# Patient Record
Sex: Female | Born: 1935 | Race: White | Hispanic: No | Smoking: Never smoker
Health system: Southern US, Community
[De-identification: ages and names within clinical notes are randomized; demographics above are authoritative.]

## PROBLEM LIST (undated history)

## (undated) DIAGNOSIS — H18519 Endothelial corneal dystrophy, unspecified eye: Secondary | ICD-10-CM

## (undated) DIAGNOSIS — M25519 Pain in unspecified shoulder: Secondary | ICD-10-CM

## (undated) DIAGNOSIS — I4891 Unspecified atrial fibrillation: Secondary | ICD-10-CM

## (undated) DIAGNOSIS — C801 Malignant (primary) neoplasm, unspecified: Secondary | ICD-10-CM

## (undated) DIAGNOSIS — M549 Dorsalgia, unspecified: Secondary | ICD-10-CM

## (undated) DIAGNOSIS — M1990 Unspecified osteoarthritis, unspecified site: Secondary | ICD-10-CM

## (undated) DIAGNOSIS — H9190 Unspecified hearing loss, unspecified ear: Secondary | ICD-10-CM

## (undated) DIAGNOSIS — R410 Disorientation, unspecified: Secondary | ICD-10-CM

## (undated) DIAGNOSIS — I872 Venous insufficiency (chronic) (peripheral): Secondary | ICD-10-CM

## (undated) DIAGNOSIS — M7071 Other bursitis of hip, right hip: Secondary | ICD-10-CM

## (undated) DIAGNOSIS — K5900 Constipation, unspecified: Secondary | ICD-10-CM

## (undated) DIAGNOSIS — M25569 Pain in unspecified knee: Secondary | ICD-10-CM

## (undated) DIAGNOSIS — K219 Gastro-esophageal reflux disease without esophagitis: Secondary | ICD-10-CM

## (undated) DIAGNOSIS — R413 Other amnesia: Secondary | ICD-10-CM

## (undated) DIAGNOSIS — R079 Chest pain, unspecified: Secondary | ICD-10-CM

## (undated) DIAGNOSIS — F22 Delusional disorders: Secondary | ICD-10-CM

## (undated) HISTORY — DX: Gastro-esophageal reflux disease without esophagitis: K21.9

## (undated) HISTORY — DX: Unspecified hearing loss, unspecified ear: H91.90

## (undated) HISTORY — DX: Other bursitis of hip, right hip: M70.71

## (undated) HISTORY — DX: Disorientation, unspecified: R41.0

## (undated) HISTORY — DX: Venous insufficiency (chronic) (peripheral): I87.2

## (undated) HISTORY — DX: Unspecified atrial fibrillation: I48.91

## (undated) HISTORY — DX: Chest pain, unspecified: R07.9

## (undated) HISTORY — DX: Malignant (primary) neoplasm, unspecified: C80.1

## (undated) HISTORY — DX: Dorsalgia, unspecified: M54.9

## (undated) HISTORY — DX: Delusional disorders: F22

## (undated) HISTORY — DX: Unspecified osteoarthritis, unspecified site: M19.90

## (undated) HISTORY — DX: Constipation, unspecified: K59.00

## (undated) HISTORY — DX: Other amnesia: R41.3

## (undated) HISTORY — DX: Pain in unspecified shoulder: M25.519

## (undated) HISTORY — PX: RECONSTRUCTION OF EYELID: SHX6576

## (undated) HISTORY — DX: Endothelial corneal dystrophy, unspecified eye: H18.519

## (undated) HISTORY — PX: RECONSTRUCTION OF NOSE: SHX2301

## (undated) HISTORY — DX: Pain in unspecified knee: M25.569

---

## 2010-11-22 HISTORY — PX: CORNEAL TRANSPLANT: SHX108

## 2019-04-24 ENCOUNTER — Encounter: Payer: Self-pay | Admitting: Internal Medicine

## 2019-04-24 ENCOUNTER — Non-Acute Institutional Stay: Payer: Medicare Other | Admitting: Internal Medicine

## 2019-04-24 DIAGNOSIS — M069 Rheumatoid arthritis, unspecified: Secondary | ICD-10-CM | POA: Insufficient documentation

## 2019-04-24 DIAGNOSIS — F0150 Vascular dementia without behavioral disturbance: Secondary | ICD-10-CM | POA: Diagnosis not present

## 2019-04-24 DIAGNOSIS — F0390 Unspecified dementia without behavioral disturbance: Secondary | ICD-10-CM | POA: Insufficient documentation

## 2019-04-24 DIAGNOSIS — M810 Age-related osteoporosis without current pathological fracture: Secondary | ICD-10-CM | POA: Diagnosis not present

## 2019-04-24 DIAGNOSIS — F419 Anxiety disorder, unspecified: Secondary | ICD-10-CM | POA: Diagnosis not present

## 2019-04-24 DIAGNOSIS — I480 Paroxysmal atrial fibrillation: Secondary | ICD-10-CM

## 2019-04-24 DIAGNOSIS — E785 Hyperlipidemia, unspecified: Secondary | ICD-10-CM | POA: Diagnosis not present

## 2019-04-24 DIAGNOSIS — M0689 Other specified rheumatoid arthritis, multiple sites: Secondary | ICD-10-CM | POA: Insufficient documentation

## 2019-04-27 LAB — BASIC METABOLIC PANEL
BUN: 19 (ref 4–21)
Creatinine: 1 (ref 0.5–1.1)
Glucose: 87
Potassium: 4.6 (ref 3.4–5.3)
Sodium: 142 (ref 137–147)

## 2019-04-27 LAB — CBC AND DIFFERENTIAL
HCT: 38 (ref 36–46)
Hemoglobin: 12.6 (ref 12.0–16.0)
Platelets: 227 (ref 150–399)
WBC: 5.8

## 2019-04-27 LAB — LIPID PANEL
Cholesterol: 163 (ref 0–200)
HDL: 55 (ref 35–70)
LDL Cholesterol: 89
LDl/HDL Ratio: 3
Triglycerides: 95 (ref 40–160)

## 2019-04-27 LAB — HEPATIC FUNCTION PANEL
ALT: 12 (ref 7–35)
AST: 18 (ref 13–35)
Alkaline Phosphatase: 57 (ref 25–125)
Bilirubin, Total: 0.6

## 2019-04-27 LAB — TSH: TSH: 4.13 (ref 0.41–5.90)

## 2019-06-03 DIAGNOSIS — I480 Paroxysmal atrial fibrillation: Secondary | ICD-10-CM | POA: Insufficient documentation

## 2019-06-03 NOTE — Progress Notes (Signed)
Provider:   Location:  [place] Room Number: 804/A Place of Service:  ALF ([phone])  PCP: Virgie Dad, MD Patient Care Team: Virgie Dad, MD as PCP - General (Internal Medicine) Pichardo-Geisinger, Mila Palmer, MD as Referring Physician Nestor Ramp Effie Shy, MD as Referring Physician (Ophthalmology)  Extended Emergency Contact Information Primary Emergency Contact: Narda Bonds Mobile Phone: [phone] Relation: Nephew Secondary Emergency Contact: Rhoderick Moody Mobile Phone: [phone] Relation: Niece  Code Status:  Goals of Care: Advanced Directive information Advanced Directives 04/24/2019  Does Patient Have a Medical Advance Directive? Yes  Type of Advance Directive Out of facility DNR (pink MOST or yellow form)  Does patient want to make changes to medical advance directive? No - Patient declined  Pre-existing out of facility DNR order (yellow form or pink MOST form) Yellow form placed in chart (order not valid for inpatient use)      Chief Complaint  Patient presents with  . New Patient (Initial Visit)    New patient     HPI: Patient is a 85 y.o. female seen today for admission to AL from Home. Patient was unable to give any history due to her Cognitive Impairment.  I got the history from patient's nephew and his wife. According to them patient has no kids and lives by herself in the house.  Due to her cognitive impairment she was not taking her medications properly.  She was not able to maintain her house.  And was having a lot of issues.  She was still driving and they thought she was very unsafe I reviewed the notes from her PCP. Patient has a history of PAF on Eliquis, GERD, Rheumatoid arthritis, Hyperlipidemia, Osteoporosis and Dementia Patient did not have any acute complaints.  She was confused about her medications.  Was little anxious about moving into a new room but she was adjusting Independent in her ADLs and walks with a walker   Past Medical History:  Diagnosis Date  . Acid reflux disease   . Arthritis    rheumatoid  . Atrial fibrillation ([place])   . Back pain   . Bursitis of hip, right   . Cancer ([place])    skin  . Chest pain   . Confusion   . Constipation   . Fuchs' corneal dystrophy   . GERD (gastroesophageal reflux disease)   . Hard of hearing   . Knee pain   . Memory loss   . Paranoia ([place])   . Shoulder pain   . Venous insufficiency    Past Surgical History:  Procedure Laterality Date  . CORNEAL TRANSPLANT  2012   Dr Maudie Mercury   . RECONSTRUCTION OF EYELID     eyelid cancer 10 years ago Dr Victorino Dike   . RECONSTRUCTION OF NOSE     nose cancer/ Dr Nyoka Cowden 10 years ago     reports that she has never smoked. She has never used smokeless tobacco. She reports previous alcohol use. She reports previous drug use. Social History   Socioeconomic History  . Marital status: Widowed    Spouse name: Not on file  . Number of children: Not on file  . Years of education: Not on file  . Highest education level: Not on file  Occupational History  . Not on file  Social Needs  . Financial resource strain: Not on file  . Food insecurity    Worry: Not on file    Inability: Not on file  . Transportation needs  Medical: Not on file    Non-medical: Not on file  Tobacco Use  . Smoking status: Never Smoker  . Smokeless tobacco: Never Used  Substance and Sexual Activity  . Alcohol use: Not Currently    Frequency: Never  . Drug use: Not Currently  . Sexual activity: Not on file  Lifestyle  . Physical activity    Days per week: Not on file    Minutes per session: Not on file  . Stress: Not on file  Relationships  . Social Herbalist on phone: Not on file    Gets together: Not on file    Attends religious service: Not on file    Active member of club or organization: Not on file    Attends meetings of clubs or organizations: Not on file    Relationship status: Not on file  . Intimate partner  violence    Fear of current or ex partner: Not on file    Emotionally abused: Not on file    Physically abused: Not on file    Forced sexual activity: Not on file  Other Topics Concern  . Not on file  Social History Narrative   Social History      Diet? No restrictions       Do you drink/eat things with caffeine? Sometimes       Marital status?  Widow                                  What year were you married? 1943      Do you live in a house, apartment, assisted living, condo, trailer, etc.? Assisted Living      Is it one or more stories? YES       How many persons live in your home? 1      Do you have any pets in your home? (please list) NO       Highest level of education completed?high school       Current or past profession: Clerical       Do you exercise? Little                                      Type & how often? Walking        Advanced Directive       Do you have a living will?YES      Do you have a DNR form? NO                                 If not, do you want to discuss one?      Do you have signed POA/HPOA for forms? YES      Functional Status      Do you have difficulty bathing or dressing yourself?YES      Do you have difficulty preparing food or eating? NO      Do you have difficulty managing your medications?YES      Do you have difficulty managing your finances?YES      Do you have difficulty affording your medications?NO       Functional Status Survey:    Family History  Problem Relation Age of Onset  . Heart disease Father   . Alzheimer's  disease Sister   . Parkinson's disease Sister     Health Maintenance  Topic Date Due  . DEXA SCAN  07/26/1989  . INFLUENZA VACCINE  06/23/2019  . TETANUS/TDAP  04/19/2023  . PNA vac Low Risk Adult  Completed    Allergies  Allergen Reactions  . Codeine   . Neosporin [Neomycin-Bacitracin Zn-Polymyx]   . Novocain [Procaine]   . Sulfamethoxazole-Trimethoprim   . Tape     Adhesive tape     Outpatient Encounter Medications as of 04/24/2019  Medication Sig  . apixaban (ELIQUIS) 2.5 MG TABS tablet Take 2.5 mg by mouth 2 (two) times daily.  . Calcium Carbonate-Vit D-Min (CALCIUM 1200) 1200-1000 MG-UNIT CHEW Chew 1 tablet by mouth daily.  . celecoxib (CELEBREX) 200 MG capsule Take 200 mg by mouth daily as needed.  Marland Kitchen denosumab (PROLIA) 60 MG/ML SOSY injection Inject 60 mg into the skin every 6 (six) months. On the 13th day of every 6th month  . folic acid (FOLVITE) 1 MG tablet Take 1 mg by mouth daily.  . methotrexate (RHEUMATREX) 2.5 MG tablet Take 15 mg by mouth once a week. Caution:Chemotherapy. Protect from light. Once a day on Friday take 6 tablets once weekly  . Polyethyl Glycol-Propyl Glycol 0.4-0.3 % SOLN Apply 1 drop to eye. 1 drop in right eye once daily,1 drop left eye 4 times daily  . pravastatin (PRAVACHOL) 20 MG tablet Take 20 mg by mouth daily.  . sodium chloride (MURO 128) 5 % ophthalmic solution Place 1 drop into both eyes 4 (four) times daily.  . [DISCONTINUED] prednisoLONE acetate (PREDNISOLONE ACETATE P-F) 1 % ophthalmic suspension Place 1 drop into the right eye daily. Place 1 drop into right eye once daily for 30 days   No facility-administered encounter medications on file as of 04/24/2019.     Review of Systems  Review of Systems  Constitutional: Negative for activity change, appetite change, chills, diaphoresis, fatigue and fever.  HENT: Negative for mouth sores, postnasal drip, rhinorrhea, sinus pain and sore throat.   Respiratory: Negative for apnea, cough, chest tightness, shortness of breath and wheezing.   Cardiovascular: Negative for chest pain, palpitations and leg swelling.  Gastrointestinal: Negative for abdominal distention, abdominal pain, constipation, diarrhea, nausea and vomiting.  Genitourinary: Negative for dysuria and frequency.  Musculoskeletal: Negative for arthralgias, joint swelling and myalgias.  Skin: Negative for rash.  Neurological:  Negative for dizziness, syncope, weakness, light-headedness and numbness.  Psychiatric/Behavioral: Negative for behavioral problems, confusion and sleep disturbance.     Vitals:   04/24/19 1024  BP: (!) 142/62  Pulse: 72  Resp: 18  Temp: 98.5 F (36.9 C)  SpO2: 96%  Weight: 149 lb 12.8 oz (67.9 kg)  Height: 5\' 6"  (1.676 m)   Body mass index is 24.18 kg/m. Physical Exam  Labs reviewed: Basic Metabolic Panel: No results for input(s): NA, K, CL, CO2, GLUCOSE, BUN, CREATININE, CALCIUM, MG, PHOS in the last 8760 hours. Liver Function Tests: No results for input(s): AST, ALT, ALKPHOS, BILITOT, PROT, ALBUMIN in the last 8760 hours. No results for input(s): LIPASE, AMYLASE in the last 8760 hours. No results for input(s): AMMONIA in the last 8760 hours. CBC: No results for input(s): WBC, NEUTROABS, HGB, HCT, MCV, PLT in the last 8760 hours. Cardiac Enzymes: No results for input(s): CKTOTAL, CKMB, CKMBINDEX, TROPONINI in the last 8760 hours. BNP: Invalid input(s): POCBNP No results found for: HGBA1C No results found for: TSH No results found for: VITAMINB12 No results found for: FOLATE No  results found for: IRON, TIBC, FERRITIN  Imaging and Procedures obtained prior to SNF admission: Patient was never admitted.  Assessment/Plan  Rheumatoid arthritis On Methotrexate and Celebrex PRN Not sure the extent if disease PAF Continue on Eliquis  Vascular dementia without behavioral disturbance  Will Get MMSE Patient will do well in AL with Supportive care  Anxiety Her Nephew worried about that she keeps calling them and telling them that she is very nervous Will start her on Buspar 2.5 mg QAM and reval  Hyperlipidemia, unspecified hyperlipidemia type Continue with Pravachol Repeat Lipid Profile . Age-related osteoporosis without current pathological fracture Continue on Prolia I was unable to find how long she has been on it Will have to repeat DEXA eventually  Family/  staff Communication:   Labs/tests ordered: CMP,CBC, Fasting Lipid  Total time spent in this patient care encounter was  45_  minutes; greater than 50% of the visit spent counseling patient and staff, reviewing records , Labs and coordinating care for problems addressed at this encounter.

## 2019-07-25 ENCOUNTER — Other Ambulatory Visit: Payer: Self-pay | Admitting: *Deleted

## 2019-07-25 ENCOUNTER — Encounter: Payer: Self-pay | Admitting: Nurse Practitioner

## 2019-07-25 ENCOUNTER — Non-Acute Institutional Stay: Payer: Medicare Other | Admitting: Nurse Practitioner

## 2019-07-25 DIAGNOSIS — F418 Other specified anxiety disorders: Secondary | ICD-10-CM | POA: Diagnosis not present

## 2019-07-25 DIAGNOSIS — E785 Hyperlipidemia, unspecified: Secondary | ICD-10-CM | POA: Diagnosis not present

## 2019-07-25 DIAGNOSIS — I480 Paroxysmal atrial fibrillation: Secondary | ICD-10-CM

## 2019-07-25 DIAGNOSIS — M069 Rheumatoid arthritis, unspecified: Secondary | ICD-10-CM

## 2019-07-25 LAB — COMPLETE METABOLIC PANEL WITH GFR
Albumin: 4.1
Calcium: 9.9
Carbon Dioxide, Total: 26
Chloride: 107
Globulin: 2.4
Total Protein: 6.5 g/dL

## 2019-07-25 NOTE — Progress Notes (Signed)
Location:  [place] Room Number: [place] of Service:  ALF [phone]) Provider:  Marlana Latus  NP  Virgie Dad, MD  Patient Care Team: Virgie Dad, MD as PCP - General (Internal Medicine) Pichardo-Geisinger, Mila Palmer, MD as Referring Physician Nestor Ramp Effie Shy, MD as Referring Physician (Ophthalmology)  Extended Emergency Contact Information Primary Emergency Contact: Einar Grad Phone: [phone] Relation: Nephew Secondary Emergency Contact: Rhoderick Moody Mobile Phone: [phone] Relation: Niece  Code Status: Full Code Goals of care: Advanced Directive information Advanced Directives 07/25/2019  Does Patient Have a Medical Advance Directive? Yes  Type of Advance Directive Living will  Does patient want to make changes to medical advance directive? No - Patient declined  Pre-existing out of facility DNR order (yellow form or pink MOST form) -     Chief Complaint  Patient presents with  . Medical Management of Chronic Issues    HPI:  Pt is a 85 y.o. female seen today for medical management of chronic diseases.     The patient resides in AL [facility] for safety, care assistance, ambulates with walker.  Hx of Hyperlipidemia, on Pravastatin 20mg  qd, last [facility] 04/27/19. RA, stable, on Methotrexate 15mg  wkly. Her mood is stable, on Buspar 2.5mg  qd. AFib, heart rate is in control, on Eliquis 2.5mg  bid for thromboembolic risk reduction.    Past Medical History:  Diagnosis Date  . Acid reflux disease   . Arthritis    rheumatoid  . Atrial fibrillation ([place])   . Back pain   . Bursitis of hip, right   . Cancer ([place])    skin  . Chest pain   . Confusion   . Constipation   . Fuchs' corneal dystrophy   . GERD (gastroesophageal reflux disease)   . Hard of hearing   . Knee pain   . Memory loss   . Paranoia ([place])   . Shoulder pain   . Venous insufficiency    Past Surgical History:  Procedure Laterality Date  . CORNEAL TRANSPLANT  2012    Dr Maudie Mercury   . RECONSTRUCTION OF EYELID     eyelid cancer 10 years ago Dr Victorino Dike   . RECONSTRUCTION OF NOSE     nose cancer/ Dr Nyoka Cowden 10 years ago     Allergies  Allergen Reactions  . Codeine   . Neosporin [Neomycin-Bacitracin Zn-Polymyx]   . Novocain [Procaine]   . Sulfamethoxazole-Trimethoprim   . Tape     Adhesive tape    Outpatient Encounter Medications as of 07/25/2019  Medication Sig  . apixaban (ELIQUIS) 2.5 MG TABS tablet Take 2.5 mg by mouth 2 (two) times daily.  . busPIRone (BUSPAR) 5 MG tablet Take 2.5 mg by mouth daily.  . Calcium Carbonate-Vit D-Min (CALCIUM 1200) 1200-1000 MG-UNIT CHEW Chew 1 tablet by mouth daily.  . celecoxib (CELEBREX) 200 MG capsule Take 200 mg by mouth daily as needed.  Marland Kitchen denosumab (PROLIA) 60 MG/ML SOSY injection Inject 60 mg into the skin every 6 (six) months. On the 13th day of every 6th month  . folic acid (FOLVITE) 1 MG tablet Take 1 mg by mouth daily.  . methotrexate (RHEUMATREX) 2.5 MG tablet Take 15 mg by mouth once a week. Caution:Chemotherapy. Protect from light. Once a day on Friday take 6 tablets once weekly  . mineral oil-hydrophilic petrolatum (AQUAPHOR) ointment Apply 1 application topically 2 (two) times daily as needed for dry skin. Administer BID PRN to forehead, legs, and right mid back  .  Polyethyl Glycol-Propyl Glycol (SYSTANE) 0.4-0.3 % SOLN Apply 1 drop to eye daily. Instill )1) drop into right eye  . Polyethyl Glycol-Propyl Glycol 0.4-0.3 % SOLN Apply 1 drop to eye 4 (four) times daily. To (L) eye  . pravastatin (PRAVACHOL) 20 MG tablet Take 20 mg by mouth daily.  . prednisoLONE acetate (PRED FORTE) 1 % ophthalmic suspension Place 1 drop into the right eye daily.  . sodium chloride (MURO 128) 5 % ophthalmic solution Place 1 drop into both eyes 4 (four) times daily.   No facility-administered encounter medications on file as of 07/25/2019.   ROS was provided with assistance of staff.   Review of Systems  Constitutional:  Negative for activity change, appetite change, chills, diaphoresis, fatigue, fever and unexpected weight change.       Gradual weight gain, about #3Ibs in the past 3-4 months, no increased SOB, cough, or swelling.   HENT: Positive for hearing loss. Negative for congestion and voice change.   Respiratory: Negative for cough, shortness of breath and wheezing.   Cardiovascular: Positive for leg swelling.  Gastrointestinal: Negative for abdominal distention, abdominal pain, constipation, diarrhea, nausea and vomiting.  Genitourinary: Negative for difficulty urinating, dysuria and urgency.  Musculoskeletal: Positive for gait problem.  Skin: Positive for color change.  Neurological: Negative for dizziness, facial asymmetry, speech difficulty, weakness and headaches.       Memory lapses.   Psychiatric/Behavioral: Positive for confusion. Negative for agitation, behavioral problems, hallucinations and sleep disturbance. The patient is not nervous/anxious.     Immunization History  Administered Date(s) Administered  . Influenza-Unspecified 10/01/2014  . Pneumococcal Conjugate-13 10/21/2014  . Pneumococcal Polysaccharide-23 01/23/2018  . Td 04/18/2013  . Zoster 09/04/2012   Pertinent  Health Maintenance Due  Topic Date Due  . DEXA SCAN  07/26/1989  . INFLUENZA VACCINE  06/23/2019  . PNA vac Low Risk Adult  Completed   No flowsheet data found. Functional Status Survey:    Vitals:   07/25/19 1035  BP: 136/78  Pulse: 76  Resp: (!) 24  Temp: 97.7 F (36.5 C)  SpO2: 97%  Weight: 152 lb 12.8 oz (69.3 kg)  Height: 5\' 6"  (1.676 m)   Body mass index is 24.66 kg/m. Physical Exam Vitals signs and nursing note reviewed.  Constitutional:      General: She is not in acute distress.    Appearance: Normal appearance. She is normal weight. She is not ill-appearing, toxic-appearing or diaphoretic.  HENT:     Head: Normocephalic and atraumatic.     Nose: Nose normal.     Mouth/Throat:      Mouth: Mucous membranes are moist.  Eyes:     Extraocular Movements: Extraocular movements intact.     Conjunctiva/sclera: Conjunctivae normal.     Pupils: Pupils are equal, round, and reactive to light.  Neck:     Musculoskeletal: Normal range of motion and neck supple.  Cardiovascular:     Rate and Rhythm: Normal rate and regular rhythm.     Heart sounds: No murmur.  Pulmonary:     Breath sounds: No wheezing, rhonchi or rales.  Abdominal:     Palpations: Abdomen is soft.     Tenderness: There is no abdominal tenderness. There is no right CVA tenderness, left CVA tenderness, guarding or rebound.  Musculoskeletal:     Right lower leg: Edema present.     Left lower leg: Edema present.     Comments: Trace edema BLE  Skin:    General: Skin is  warm and dry.     Comments: Venous insufficiency skin changes BLE  Neurological:     General: No focal deficit present.     Mental Status: She is alert. Mental status is at baseline.     Cranial Nerves: No cranial nerve deficit.     Motor: No weakness.     Coordination: Coordination normal.     Gait: Gait abnormal.     Comments: Oriented to person, place.   Psychiatric:        Mood and Affect: Mood normal.        Behavior: Behavior normal.     Labs reviewed: Recent Labs    04/27/19  NA 142  K 4.6  CL 107  CO2 26  BUN 19  CREATININE 1.0  CALCIUM 9.9   Recent Labs    04/27/19  AST 18  ALT 12  ALKPHOS 57  PROT 6.5  ALBUMIN 4.1   Recent Labs    04/27/19  WBC 5.8  HGB 12.6  HCT 38  PLT 227   Lab Results  Component Value Date   TSH 4.13 04/27/2019   No results found for: HGBA1C Lab Results  Component Value Date   CHOL 163 04/27/2019   HDL 55 04/27/2019   LDLCALC 89 04/27/2019   TRIG 95 04/27/2019    Significant Diagnostic Results in last 30 days:  No results found.  Assessment/Plan Hyperlipidemia Controlled, last LDL 89 04/27/19, continue Pravastatin 20mg  qd.   Rheumatoid arthritis (HCC) Stable, ambulates  with walker, continue Methotrexate 15mg  qd  Depression with anxiety Her mood is stable, continue Buspar 2.5mg  qd.   PAF (paroxysmal atrial fibrillation) (HCC) Heart rate is in control, continue Eliquis 2.5mg  bid for thromboembolic risk reduction.      Family/ staff Communication:plan of care reviewed with the patient and charge nurse.   Labs/tests ordered:  none  Time spend 40 minutes.

## 2019-07-27 ENCOUNTER — Encounter: Payer: Self-pay | Admitting: Nurse Practitioner

## 2019-07-27 DIAGNOSIS — F418 Other specified anxiety disorders: Secondary | ICD-10-CM | POA: Insufficient documentation

## 2019-07-27 NOTE — Assessment & Plan Note (Signed)
Her mood is stable, continue Buspar 2.5mg  qd.

## 2019-07-27 NOTE — Assessment & Plan Note (Signed)
Controlled, last LDL 89 04/27/19, continue Pravastatin 20mg  qd.

## 2019-07-27 NOTE — Assessment & Plan Note (Signed)
Stable, ambulates with walker, continue Methotrexate 15mg  qd

## 2019-07-27 NOTE — Assessment & Plan Note (Signed)
Heart rate is in control, continue Eliquis 2.5mg bid for thromboembolic risk reduction.  

## 2019-10-16 ENCOUNTER — Non-Acute Institutional Stay: Payer: Medicare Other | Admitting: Nurse Practitioner

## 2019-10-16 ENCOUNTER — Encounter: Payer: Self-pay | Admitting: Nurse Practitioner

## 2019-10-16 DIAGNOSIS — I480 Paroxysmal atrial fibrillation: Secondary | ICD-10-CM

## 2019-10-16 DIAGNOSIS — F418 Other specified anxiety disorders: Secondary | ICD-10-CM

## 2019-10-16 DIAGNOSIS — M069 Rheumatoid arthritis, unspecified: Secondary | ICD-10-CM

## 2019-10-16 DIAGNOSIS — W19XXXA Unspecified fall, initial encounter: Secondary | ICD-10-CM | POA: Diagnosis not present

## 2019-10-16 DIAGNOSIS — R296 Repeated falls: Secondary | ICD-10-CM | POA: Insufficient documentation

## 2019-10-16 DIAGNOSIS — F0150 Vascular dementia without behavioral disturbance: Secondary | ICD-10-CM

## 2019-10-16 DIAGNOSIS — M810 Age-related osteoporosis without current pathological fracture: Secondary | ICD-10-CM | POA: Diagnosis not present

## 2019-10-16 NOTE — Progress Notes (Signed)
Location:   [place] Room Number: [id number] Place of Service:  ALF (13) Provider:  Clide Remmers NP  Virgie Dad, MD  Patient Care Team: Virgie Dad, MD as PCP - General (Internal Medicine) Pichardo-Geisinger, Mila Palmer, MD as Referring Physician Nestor Ramp Effie Shy, MD as Referring Physician (Ophthalmology)  Extended Emergency Contact Information Primary Emergency Contact: Einar Grad Phone: [phone] Relation: Nephew Secondary Emergency Contact: Rhoderick Moody Mobile Phone: [phone] Relation: Niece  Code Status:  Full Code Goals of care: Advanced Directive information Advanced Directives 07/25/2019  Does Patient Have a Medical Advance Directive? Yes  Type of Advance Directive Living will  Does patient want to make changes to medical advance directive? No - Patient declined  Pre-existing out of facility DNR order (yellow form or pink MOST form) -     Chief Complaint  Patient presents with  . Acute Visit    Fall    HPI:  Pt is a 85 y.o. female seen today for an acute visit  for 10/15/19 witnessed fall when trying to open door for someone and turned around suddenly and lost balance, the patient stated she lost balance due to her inadequate foot wear. No apparent injury.   Hx of RA, stable, on Methotrexate 15mg  wkly. Osteoporosis, stable, on Prolia. Her mood is stable on Buspar 2.5mg  qd. AFbi, heart rate is in control, on Eliquis 2.5mg  bid. Resides in [facility] for safety, care assistance.    Past Medical History:  Diagnosis Date  . Acid reflux disease   . Arthritis    rheumatoid  . Atrial fibrillation ([place])   . Back pain   . Bursitis of hip, right   . Cancer ([place])    skin  . Chest pain   . Confusion   . Constipation   . Fuchs' corneal dystrophy   . GERD (gastroesophageal reflux disease)   . Hard of hearing   . Knee pain   . Memory loss   . Paranoia ([place])   . Shoulder pain   . Venous insufficiency    Past Surgical History:  Procedure  Laterality Date  . CORNEAL TRANSPLANT  2012   Dr Maudie Mercury   . RECONSTRUCTION OF EYELID     eyelid cancer 10 years ago Dr Victorino Dike   . RECONSTRUCTION OF NOSE     nose cancer/ Dr Nyoka Cowden 10 years ago     Allergies  Allergen Reactions  . Codeine   . Neosporin [Neomycin-Bacitracin Zn-Polymyx]   . Novocain [Procaine]   . Sulfamethoxazole-Trimethoprim   . Tape     Adhesive tape    Allergies as of 10/16/2019      Reactions   Codeine    Neosporin [neomycin-bacitracin Zn-polymyx]    Novocain [procaine]    Sulfamethoxazole-trimethoprim    Tape    Adhesive tape      Medication List       Accurate as of October 16, 2019 11:59 PM. If you have any questions, ask your nurse or doctor.        busPIRone 5 MG tablet Commonly known as: BUSPAR Take 2.5 mg by mouth daily.   Calcium 1200 1200-1000 MG-UNIT Chew Chew 1 tablet by mouth daily.   celecoxib 200 MG capsule Commonly known as: CELEBREX Take 200 mg by mouth daily as needed.   denosumab 60 MG/ML Sosy injection Commonly known as: PROLIA Inject 60 mg into the skin every 6 (six) months. On the 13th day of every 6th month   Eliquis 2.5  MG Tabs tablet Generic drug: apixaban Take 2.5 mg by mouth 2 (two) times daily.   folic acid 1 MG tablet Commonly known as: FOLVITE Take 1 mg by mouth daily.   methotrexate 2.5 MG tablet Commonly known as: RHEUMATREX Take 15 mg by mouth once a week. Caution:Chemotherapy. Protect from light. Once a day on Friday take 6 tablets once weekly   mineral oil-hydrophilic petrolatum ointment Apply 1 application topically 2 (two) times daily as needed for dry skin. Administer BID PRN to forehead, legs, and right mid back   Polyethyl Glycol-Propyl Glycol 0.4-0.3 % Soln Apply 1 drop to eye 4 (four) times daily. To (L) eye   Systane 0.4-0.3 % Soln Generic drug: Polyethyl Glycol-Propyl Glycol Apply 1 drop to eye daily. Instill )1) drop into right eye   pravastatin 20 MG tablet Commonly known as:  PRAVACHOL Take 20 mg by mouth daily.   prednisoLONE acetate 1 % ophthalmic suspension Commonly known as: PRED FORTE Place 1 drop into the right eye daily.   sodium chloride 5 % ophthalmic solution Commonly known as: MURO 128 Place 1 drop into both eyes 4 (four) times daily.      ROS was provided with assistance of staff.  Review of Systems  Constitutional: Negative for activity change, appetite change, chills, diaphoresis, fatigue and fever.  HENT: Positive for hearing loss. Negative for congestion and voice change.   Respiratory: Negative for cough, shortness of breath and wheezing.   Cardiovascular: Positive for leg swelling. Negative for chest pain and palpitations.  Gastrointestinal: Negative for abdominal distention, abdominal pain, constipation, diarrhea, nausea and vomiting.  Genitourinary: Negative for difficulty urinating, dysuria and urgency.  Musculoskeletal: Positive for gait problem.  Skin: Negative for color change and pallor.  Neurological: Negative for dizziness, speech difficulty, weakness and headaches.       Memory lapses.   Psychiatric/Behavioral: Negative for agitation, behavioral problems, hallucinations and sleep disturbance. The patient is not nervous/anxious.     Immunization History  Administered Date(s) Administered  . Influenza-Unspecified 10/01/2014  . Pneumococcal Conjugate-13 10/21/2014  . Pneumococcal Polysaccharide-23 01/23/2018  . Td 04/18/2013  . Zoster 09/04/2012   Pertinent  Health Maintenance Due  Topic Date Due  . DEXA SCAN  07/26/1989  . INFLUENZA VACCINE  Completed  . PNA vac Low Risk Adult  Completed   No flowsheet data found. Functional Status Survey:    Vitals:   10/16/19 1221  BP: 128/74  Pulse: 76  Resp: 18  Temp: (!) 97.5 F (36.4 C)  SpO2: 96%  Weight: 155 lb 6.4 oz (70.5 kg)  Height: 5\' 6"  (1.676 m)   Body mass index is 25.08 kg/m. Physical Exam Vitals signs and nursing note reviewed.  Constitutional:       General: She is not in acute distress.    Appearance: Normal appearance. She is not ill-appearing.  HENT:     Head: Normocephalic and atraumatic.     Nose: Nose normal.     Mouth/Throat:     Mouth: Mucous membranes are moist.  Eyes:     Conjunctiva/sclera: Conjunctivae normal.     Pupils: Pupils are equal, round, and reactive to light.  Neck:     Musculoskeletal: Normal range of motion and neck supple.  Cardiovascular:     Rate and Rhythm: Normal rate and regular rhythm.     Heart sounds: No murmur.  Pulmonary:     Breath sounds: No wheezing, rhonchi or rales.  Abdominal:     General: Bowel sounds are  normal. There is no distension.     Palpations: Abdomen is soft.     Tenderness: There is no abdominal tenderness. There is no right CVA tenderness, left CVA tenderness, guarding or rebound.  Musculoskeletal:     Right lower leg: Edema present.     Left lower leg: Edema present.     Comments: Trace edema BLE  Skin:    General: Skin is warm and dry.     Comments: Chronic venous insufficiency skin changes BLE  Neurological:     General: No focal deficit present.     Mental Status: She is alert. Mental status is at baseline.     Cranial Nerves: No cranial nerve deficit.     Motor: No weakness.     Coordination: Coordination normal.     Gait: Gait abnormal.     Comments: Oriented to person, place.   Psychiatric:        Mood and Affect: Mood normal.        Behavior: Behavior normal.        Thought Content: Thought content normal.     Labs reviewed: Recent Labs    04/27/19  NA 142  K 4.6  CL 107  CO2 26  BUN 19  CREATININE 1.0  CALCIUM 9.9   Recent Labs    04/27/19  AST 18  ALT 12  ALKPHOS 57  PROT 6.5  ALBUMIN 4.1   Recent Labs    04/27/19  WBC 5.8  HGB 12.6  HCT 38  PLT 227   Lab Results  Component Value Date   TSH 4.13 04/27/2019   No results found for: HGBA1C Lab Results  Component Value Date   CHOL 163 04/27/2019   HDL 55 04/27/2019   LDLCALC  89 04/27/2019   TRIG 95 04/27/2019    Significant Diagnostic Results in last 30 days:  No results found.  Assessment/Plan Fall 10/15/19 witnessed fall when trying to open door for someone and turned around suddenly and lost balance, the patient stated she lost balance due to her inadequate foot wear. No apparent injury. Encourage safety awareness. Close supervision for safety.    Vascular dementia ([place]) Continue [facility] for safety, care assistance, close supervision for safety is warranted. 04/25/19 MMSE 24/30, passed clock draw. Memantine 5mg  qd for memory if HPOA consents.   Rheumatoid arthritis (HCC) Stable, continue Methotrexate.   Osteoporosis Continue Prolia for now.   Depression with anxiety Her mood is stable, continue Buspar 2.5mg  qd.   PAF (paroxysmal atrial fibrillation) (HCC) Heart rate is in control, continue Eliquis 2.5mg  bid.      Family/ staff Communication: plan of care reviewed with the patient and charge nurse.   Labs/tests ordered:  none  Time spend 40 minutes.

## 2019-10-17 ENCOUNTER — Encounter: Payer: Self-pay | Admitting: Nurse Practitioner

## 2019-10-17 NOTE — Assessment & Plan Note (Addendum)
Continue [facility] for safety, care assistance, close supervision for safety is warranted. 04/25/19 MMSE 24/30, passed clock draw. Memantine 5mg  qd for memory if HPOA consents.

## 2019-10-17 NOTE — Assessment & Plan Note (Signed)
10/15/19 witnessed fall when trying to open door for someone and turned around suddenly and lost balance, the patient stated she lost balance due to her inadequate foot wear. No apparent injury. Encourage safety awareness. Close supervision for safety.

## 2019-10-17 NOTE — Assessment & Plan Note (Signed)
Stable, continue Methotrexate.  

## 2019-10-17 NOTE — Assessment & Plan Note (Signed)
Heart rate is in control, continue Eliquis 2.5mg bid  

## 2019-10-17 NOTE — Assessment & Plan Note (Signed)
Her mood is stable, continue Buspar 2.5mg  qd.

## 2019-10-17 NOTE — Assessment & Plan Note (Signed)
Continue Prolia for now.

## 2019-10-24 ENCOUNTER — Encounter: Payer: Self-pay | Admitting: Nurse Practitioner

## 2019-10-24 ENCOUNTER — Non-Acute Institutional Stay: Payer: Medicare Other | Admitting: Nurse Practitioner

## 2019-10-24 DIAGNOSIS — F0150 Vascular dementia without behavioral disturbance: Secondary | ICD-10-CM

## 2019-10-24 DIAGNOSIS — I480 Paroxysmal atrial fibrillation: Secondary | ICD-10-CM

## 2019-10-24 DIAGNOSIS — F418 Other specified anxiety disorders: Secondary | ICD-10-CM

## 2019-10-24 DIAGNOSIS — M069 Rheumatoid arthritis, unspecified: Secondary | ICD-10-CM

## 2019-10-24 DIAGNOSIS — M810 Age-related osteoporosis without current pathological fracture: Secondary | ICD-10-CM | POA: Diagnosis not present

## 2019-10-24 NOTE — Assessment & Plan Note (Signed)
Continue [facility] for safety, care assistance.

## 2019-10-24 NOTE — Assessment & Plan Note (Signed)
Her mood is stable, continue Buspar.

## 2019-10-24 NOTE — Assessment & Plan Note (Signed)
No recent fxs, continue Vit D, Ca, Prolia.

## 2019-10-24 NOTE — Progress Notes (Signed)
Location:   [place] Room Number: [id number] Place of Service:  ALF (13) Provider: Lennie Odor Dahl Higinbotham NP  Virgie Dad, MD  Patient Care Team: Virgie Dad, MD as PCP - General (Internal Medicine) Pichardo-Geisinger, Mila Palmer, MD as Referring Physician Nestor Ramp Effie Shy, MD as Referring Physician (Ophthalmology)  Extended Emergency Contact Information Primary Emergency Contact: Einar Grad Phone: [phone] Relation: Nephew Secondary Emergency Contact: Rhoderick Moody Mobile Phone: [phone] Relation: Niece  Code Status:  DNR Goals of care: Advanced Directive information Advanced Directives 07/25/2019  Does Patient Have a Medical Advance Directive? Yes  Type of Advance Directive Living will  Does patient want to make changes to medical advance directive? No - Patient declined  Pre-existing out of facility DNR order (yellow form or pink MOST form) -     Chief Complaint  Patient presents with  . Medical Management of Chronic Issues    HPI:  Pt is a 85 y.o. female seen today for medical management of chronic diseases.    The patient resides in [facility] for safety, care assistance, her mood is stable, on Buspar 2.5mg  qd. AFib, heart rate is in control, on Eliquis 2.5mg  bid. RA, stable, on Methotrexate 15 mg wkly, prn Celebrex. Osteoporosis, taking Ca, Vit D, Prolia.    Past Medical History:  Diagnosis Date  . Acid reflux disease   . Arthritis    rheumatoid  . Atrial fibrillation ([place])   . Back pain   . Bursitis of hip, right   . Cancer ([place])    skin  . Chest pain   . Confusion   . Constipation   . Fuchs' corneal dystrophy   . GERD (gastroesophageal reflux disease)   . Hard of hearing   . Knee pain   . Memory loss   . Paranoia ([place])   . Shoulder pain   . Venous insufficiency    Past Surgical History:  Procedure Laterality Date  . CORNEAL TRANSPLANT  2012   Dr Maudie Mercury   . RECONSTRUCTION OF EYELID     eyelid cancer 10 years ago Dr Victorino Dike   .  RECONSTRUCTION OF NOSE     nose cancer/ Dr Nyoka Cowden 10 years ago     Allergies  Allergen Reactions  . Codeine   . Neosporin [Neomycin-Bacitracin Zn-Polymyx]   . Novocain [Procaine]   . Sulfamethoxazole-Trimethoprim   . Tape     Adhesive tape    Allergies as of 10/24/2019      Reactions   Codeine    Neosporin [neomycin-bacitracin Zn-polymyx]    Novocain [procaine]    Sulfamethoxazole-trimethoprim    Tape    Adhesive tape      Medication List       Accurate as of October 24, 2019  3:00 PM. If you have any questions, ask your nurse or doctor.        busPIRone 5 MG tablet Commonly known as: BUSPAR Take 2.5 mg by mouth daily.   Calcium 1200 1200-1000 MG-UNIT Chew Chew 1 tablet by mouth daily.   celecoxib 200 MG capsule Commonly known as: CELEBREX Take 200 mg by mouth daily as needed.   denosumab 60 MG/ML Sosy injection Commonly known as: PROLIA Inject 60 mg into the skin every 6 (six) months. On the 13th day of every 6th month   Eliquis 2.5 MG Tabs tablet Generic drug: apixaban Take 2.5 mg by mouth 2 (two) times daily.   folic acid 1 MG tablet Commonly known as: FOLVITE Take 1  mg by mouth daily.   methotrexate 2.5 MG tablet Commonly known as: RHEUMATREX Take 15 mg by mouth once a week. Caution:Chemotherapy. Protect from light. Once a day on Friday take 6 tablets once weekly   mineral oil-hydrophilic petrolatum ointment Apply 1 application topically 2 (two) times daily as needed for dry skin. Administer BID PRN to forehead, legs, and right mid back   Polyethyl Glycol-Propyl Glycol 0.4-0.3 % Soln Apply 1 drop to eye 4 (four) times daily. To (L) eye   Systane 0.4-0.3 % Soln Generic drug: Polyethyl Glycol-Propyl Glycol Apply 1 drop to eye daily. Instill )1) drop into right eye   pravastatin 20 MG tablet Commonly known as: PRAVACHOL Take 20 mg by mouth daily.   prednisoLONE acetate 1 % ophthalmic suspension Commonly known as: PRED FORTE Place 1 drop into  the right eye daily.   sodium chloride 5 % ophthalmic solution Commonly known as: MURO 128 Place 1 drop into both eyes 4 (four) times daily.      ROS was provided with assistance of staff.  Review of Systems  Constitutional: Negative for activity change, appetite change, chills, diaphoresis, fatigue and fever.  HENT: Positive for hearing loss. Negative for congestion and voice change.   Respiratory: Negative for cough, shortness of breath and wheezing.   Cardiovascular: Negative for chest pain, palpitations and leg swelling.  Gastrointestinal: Negative for abdominal distention, abdominal pain, constipation, diarrhea, nausea and vomiting.  Genitourinary: Negative for difficulty urinating, dysuria and urgency.  Musculoskeletal: Positive for arthralgias and gait problem.  Skin: Negative for color change and pallor.  Neurological: Negative for dizziness, speech difficulty, weakness and headaches.       Dementia  Psychiatric/Behavioral: Positive for confusion. Negative for agitation, behavioral problems, hallucinations and sleep disturbance. The patient is not nervous/anxious.     Immunization History  Administered Date(s) Administered  . Influenza-Unspecified 10/01/2014  . Pneumococcal Conjugate-13 10/21/2014  . Pneumococcal Polysaccharide-23 01/23/2018  . Td 04/18/2013  . Zoster 09/04/2012   Pertinent  Health Maintenance Due  Topic Date Due  . DEXA SCAN  07/26/1989  . INFLUENZA VACCINE  Completed  . PNA vac Low Risk Adult  Completed   No flowsheet data found. Functional Status Survey:    Vitals:   10/24/19 1440  BP: 130/70  Pulse: 82  Resp: 18  Temp: 97.6 F (36.4 C)   There is no height or weight on file to calculate BMI. Physical Exam Vitals signs and nursing note reviewed.  Constitutional:      General: She is not in acute distress.    Appearance: Normal appearance. She is not ill-appearing, toxic-appearing or diaphoretic.  HENT:     Head: Normocephalic and  atraumatic.     Nose: Nose normal.     Mouth/Throat:     Mouth: Mucous membranes are moist.  Eyes:     Extraocular Movements: Extraocular movements intact.     Conjunctiva/sclera: Conjunctivae normal.     Pupils: Pupils are equal, round, and reactive to light.  Neck:     Musculoskeletal: Normal range of motion and neck supple.  Cardiovascular:     Rate and Rhythm: Normal rate and regular rhythm.  Pulmonary:     Breath sounds: No wheezing, rhonchi or rales.  Abdominal:     General: Bowel sounds are normal. There is no distension.     Palpations: Abdomen is soft.     Tenderness: There is no abdominal tenderness. There is no right CVA tenderness, left CVA tenderness, guarding or rebound.  Musculoskeletal:     Right lower leg: No edema.     Left lower leg: No edema.  Skin:    General: Skin is warm and dry.     Comments: Right temple/forehead healed removed skin lesion.   Neurological:     General: No focal deficit present.     Mental Status: She is alert. Mental status is at baseline.     Motor: No weakness.     Coordination: Coordination normal.     Gait: Gait abnormal.     Comments: Oriented to person, place.   Psychiatric:        Mood and Affect: Mood normal.        Behavior: Behavior normal.     Labs reviewed: Recent Labs    04/27/19  NA 142  K 4.6  CL 107  CO2 26  BUN 19  CREATININE 1.0  CALCIUM 9.9   Recent Labs    04/27/19  AST 18  ALT 12  ALKPHOS 57  PROT 6.5  ALBUMIN 4.1   Recent Labs    04/27/19  WBC 5.8  HGB 12.6  HCT 38  PLT 227   Lab Results  Component Value Date   TSH 4.13 04/27/2019   No results found for: HGBA1C Lab Results  Component Value Date   CHOL 163 04/27/2019   HDL 55 04/27/2019   LDLCALC 89 04/27/2019   TRIG 95 04/27/2019    Significant Diagnostic Results in last 30 days:  No results found.  Assessment/Plan  Rheumatoid arthritis (HCC) Stable, ambulates with walker, continue [facility] for safety, care assistance,  continue Methotrexate 15mg  wkly.   PAF (paroxysmal atrial fibrillation) (HCC) Heart rate is in control, continue Eliquis for thromboembolic risk reduction.   Vascular dementia ([place]) Continue [facility] for safety, care assistance.   Osteoporosis No recent fxs, continue Vit D, Ca, Prolia.   Depression with anxiety Her mood is stable, continue Buspar.    Family/ staff Communication: plan of care reviewed with the patient and charge nurse.   Labs/tests ordered: none  Time spend 40 minutes.

## 2019-10-24 NOTE — Assessment & Plan Note (Signed)
Heart rate is in control, continue Eliquis for thromboembolic risk reduction.

## 2019-10-24 NOTE — Assessment & Plan Note (Signed)
Stable, ambulates with walker, continue [facility] for safety, care assistance, continue Methotrexate 15mg  wkly.

## 2019-12-06 ENCOUNTER — Encounter: Payer: Self-pay | Admitting: Internal Medicine

## 2019-12-06 ENCOUNTER — Non-Acute Institutional Stay: Payer: Medicare Other | Admitting: Internal Medicine

## 2019-12-06 DIAGNOSIS — M79601 Pain in right arm: Secondary | ICD-10-CM | POA: Diagnosis not present

## 2019-12-06 DIAGNOSIS — M069 Rheumatoid arthritis, unspecified: Secondary | ICD-10-CM | POA: Diagnosis not present

## 2019-12-06 DIAGNOSIS — R531 Weakness: Secondary | ICD-10-CM

## 2019-12-06 DIAGNOSIS — I480 Paroxysmal atrial fibrillation: Secondary | ICD-10-CM | POA: Diagnosis not present

## 2019-12-06 DIAGNOSIS — F418 Other specified anxiety disorders: Secondary | ICD-10-CM

## 2019-12-06 DIAGNOSIS — M810 Age-related osteoporosis without current pathological fracture: Secondary | ICD-10-CM

## 2019-12-06 DIAGNOSIS — F0150 Vascular dementia without behavioral disturbance: Secondary | ICD-10-CM

## 2019-12-06 DIAGNOSIS — E785 Hyperlipidemia, unspecified: Secondary | ICD-10-CM

## 2019-12-06 NOTE — Progress Notes (Addendum)
Provider:  Virgie Dad, MD Location:    [place] Room Number: [phone] Place of Service:  ALF ([phone])  PCP: Virgie Dad, MD Patient Care Team: Virgie Dad, MD as PCP - General (Internal Medicine) Pichardo-Geisinger, Mila Palmer, MD as Referring Physician Nestor Ramp Effie Shy, MD as Referring Physician (Ophthalmology)  Extended Emergency Contact Information Primary Emergency Contact: Einar Grad Phone: [phone] Relation: Nephew Secondary Emergency Contact: Rhoderick Moody Mobile Phone: [phone] Relation: Niece  Code Status: Full Code Goals of Care: Advanced Directive information Advanced Directives 12/06/2019  Does Patient Have a Medical Advance Directive? No  Type of Advance Directive -  Does patient want to make changes to medical advance directive? -  Would patient like information on creating a medical advance directive? No - Patient declined  Pre-existing out of facility DNR order (yellow form or pink MOST form) -      Chief Complaint  Patient presents with  . Acute Visit        HPI: Patient is a 86 y.o. female seen today for Acute Visit for Weakness  Patient has a history of PAF on Eliquis, GERD, Rheumatoid arthritis, Hyperlipidemia, Osteoporosis and Dementia with Anxiety  Patient has been stable in AL but for past few days she has been telling the nurse that she feels weak and tired. Her only complaint to me was the weakness and she was also complaining of some pain in her right arm.   She first said right shoulder but then it looks like her was muscles in her right arm.   Resident had gotten Covid vaccine on 11/24/19 Not sure if it was that arm. Patient/Staff could not tell me. She denies any cough shortness of breath fever chills dysuria abdominal pain.  No dizziness Patient uses walker No h/o Falls. Appetite is good  Past Medical History:  Diagnosis Date  . Acid reflux disease   . Arthritis    rheumatoid  . Atrial  fibrillation ([place])   . Back pain   . Bursitis of hip, right   . Cancer ([place])    skin  . Chest pain   . Confusion   . Constipation   . Fuchs' corneal dystrophy   . GERD (gastroesophageal reflux disease)   . Hard of hearing   . Knee pain   . Memory loss   . Paranoia ([place])   . Shoulder pain   . Venous insufficiency    Past Surgical History:  Procedure Laterality Date  . CORNEAL TRANSPLANT  2012   Dr Maudie Mercury   . RECONSTRUCTION OF EYELID     eyelid cancer 10 years ago Dr Victorino Dike   . RECONSTRUCTION OF NOSE     nose cancer/ Dr Nyoka Cowden 10 years ago     reports that she has never smoked. She has never used smokeless tobacco. She reports previous alcohol use. She reports previous drug use. Social History   Socioeconomic History  . Marital status: Widowed    Spouse name: Not on file  . Number of children: Not on file  . Years of education: Not on file  . Highest education level: Not on file  Occupational History  . Not on file  Tobacco Use  . Smoking status: Never Smoker  . Smokeless tobacco: Never Used  Substance and Sexual Activity  . Alcohol use: Not Currently  . Drug use: Not Currently  . Sexual activity: Not on file  Other Topics Concern  . Not on file  Social History  Narrative   Social History      Diet? No restrictions       Do you drink/eat things with caffeine? Sometimes       Marital status?  Widow                                  What year were you married? 1943      Do you live in a house, apartment, assisted living, condo, trailer, etc.? Assisted Living      Is it one or more stories? YES       How many persons live in your home? 1      Do you have any pets in your home? (please list) NO       Highest level of education completed?high school       Current or past profession: Clerical       Do you exercise? Little                                      Type & how often? Walking        Advanced Directive       Do you have a living will?YES      Do  you have a DNR form? NO                                 If not, do you want to discuss one?      Do you have signed POA/HPOA for forms? YES      Functional Status      Do you have difficulty bathing or dressing yourself?YES      Do you have difficulty preparing food or eating? NO      Do you have difficulty managing your medications?YES      Do you have difficulty managing your finances?YES      Do you have difficulty affording your medications?NO      Social Determinants of Health   Financial Resource Strain:   . Difficulty of Paying Living Expenses: Not on file  Food Insecurity:   . Worried About Charity fundraiser in the Last Year: Not on file  . Ran Out of Food in the Last Year: Not on file  Transportation Needs:   . Lack of Transportation (Medical): Not on file  . Lack of Transportation (Non-Medical): Not on file  Physical Activity:   . Days of Exercise per Week: Not on file  . Minutes of Exercise per Session: Not on file  Stress:   . Feeling of Stress : Not on file  Social Connections:   . Frequency of Communication with Friends and Family: Not on file  . Frequency of Social Gatherings with Friends and Family: Not on file  . Attends Religious Services: Not on file  . Active Member of Clubs or Organizations: Not on file  . Attends Archivist Meetings: Not on file  . Marital Status: Not on file  Intimate Partner Violence:   . Fear of Current or Ex-Partner: Not on file  . Emotionally Abused: Not on file  . Physically Abused: Not on file  . Sexually Abused: Not on file    Functional Status Survey:    Family History  Problem Relation Age of  Onset  . Heart disease Father   . Alzheimer's disease Sister   . Parkinson's disease Sister     Health Maintenance  Topic Date Due  . DEXA SCAN  07/26/1989  . TETANUS/TDAP  04/19/2023  . INFLUENZA VACCINE  Completed  . PNA vac Low Risk Adult  Completed    Allergies  Allergen Reactions  . Codeine   .  Neosporin [Neomycin-Bacitracin Zn-Polymyx]   . Novocain [Procaine]   . Sulfamethoxazole-Trimethoprim   . Tape     Adhesive tape    Allergies as of 12/06/2019      Reactions   Codeine    Neosporin [neomycin-bacitracin Zn-polymyx]    Novocain [procaine]    Sulfamethoxazole-trimethoprim    Tape    Adhesive tape      Medication List       Accurate as of December 06, 2019  3:02 PM. If you have any questions, ask your nurse or doctor.        busPIRone 5 MG tablet Commonly known as: BUSPAR Take 2.5 mg by mouth daily.   Calcium 1200 1200-1000 MG-UNIT Chew Chew 1 tablet by mouth daily.   celecoxib 200 MG capsule Commonly known as: CELEBREX Take 200 mg by mouth daily as needed.   denosumab 60 MG/ML Sosy injection Commonly known as: PROLIA Inject 60 mg into the skin every 6 (six) months. On the 13th day of every 6th month   Eliquis 2.5 MG Tabs tablet Generic drug: apixaban Take 2.5 mg by mouth 2 (two) times daily.   folic acid 1 MG tablet Commonly known as: FOLVITE Take 1 mg by mouth daily.   memantine 5 MG tablet Commonly known as: NAMENDA Take 5 mg by mouth daily.   methotrexate 2.5 MG tablet Commonly known as: RHEUMATREX Take 15 mg by mouth once a week. Caution:Chemotherapy. Protect from light. Once a day on Friday take 6 tablets once weekly   mineral oil-hydrophilic petrolatum ointment Apply 1 application topically 2 (two) times daily as needed for dry skin. Administer BID PRN to forehead, legs, and right mid back   Polyethyl Glycol-Propyl Glycol 0.4-0.3 % Soln Apply 1 drop to eye 4 (four) times daily. To (L) eye   Systane 0.4-0.3 % Soln Generic drug: Polyethyl Glycol-Propyl Glycol Apply 1 drop to eye daily. Instill )1) drop into right eye   pravastatin 20 MG tablet Commonly known as: PRAVACHOL Take 20 mg by mouth daily.   prednisoLONE acetate 1 % ophthalmic suspension Commonly known as: PRED FORTE Place 1 drop into the right eye daily.   sodium  chloride 5 % ophthalmic solution Commonly known as: MURO 128 Place 1 drop into both eyes 4 (four) times daily.       Review of Systems  Review of Systems  Constitutional: Negative for activity change, appetite change, chills, diaphoresis, fatigue and fever.  HENT: Negative for mouth sores, postnasal drip, rhinorrhea, sinus pain and sore throat.   Respiratory: Negative for apnea, cough, chest tightness, shortness of breath and wheezing.   Cardiovascular: Negative for chest pain, palpitations and leg swelling.  Gastrointestinal: Negative for abdominal distention, abdominal pain, constipation, diarrhea, nausea and vomiting.  Genitourinary: Negative for dysuria and frequency.  Musculoskeletal: Positive for arthralgias and myalgias.  Skin: Negative for rash.  Neurological: Negative for dizziness, syncope light-headedness and numbness.  Psychiatric/Behavioral: Negative for behavioral problems, confusion and sleep disturbance.   DX  Vitals:   12/06/19 1031  BP: (!) 160/78  Pulse: 60  Resp: 17  Temp: (!) 97.5 F (  36.4 C)  SpO2: 96%  Weight: 159 lb 6.4 oz (72.3 kg)  Height: 5' 6"  (1.676 m)   Body mass index is 25.73 kg/m. Physical Exam  Constitutional:Well-developed and well-nourished.  HENT:  Head: Normocephalic.  Mouth/Throat: Oropharynx is clear and moist.  Eyes: Pupils are equal, round, and reactive to light.  Neck: Neck supple.  Cardiovascular: Normal rate and normal heart sounds.  No murmur heard. Pulmonary/Chest: Effort normal and breath sounds normal. No respiratory distress. No wheezes. She has no rales.  Abdominal: Soft. Bowel sounds are normal. No distension. There is no tenderness. There is no rebound.  Musculoskeletal: No edema. C/O Pain in her Right UE . Right Shoulder movement did not show any Pain Lymphadenopathy: none Neurological: No Focal Deficits .Has Good Strength in All extremeties Skin: Skin is warm and dry.  Psychiatric: Normal mood and affect.  Behavior is normal. Thought content normal.    Labs reviewed: Basic Metabolic Panel: Recent Labs    04/27/19 0000  NA 142  K 4.6  CL 107  CO2 26  BUN 19  CREATININE 1.0  CALCIUM 9.9   Liver Function Tests: Recent Labs    04/27/19 0000  AST 18  ALT 12  ALKPHOS 57  PROT 6.5  ALBUMIN 4.1   No results for input(s): LIPASE, AMYLASE in the last 8760 hours. No results for input(s): AMMONIA in the last 8760 hours. CBC: Recent Labs    04/27/19 0000  WBC 5.8  HGB 12.6  HCT 38  PLT 227   Cardiac Enzymes: No results for input(s): CKTOTAL, CKMB, CKMBINDEX, TROPONINI in the last 8760 hours. BNP: Invalid input(s): POCBNP No results found for: HGBA1C Lab Results  Component Value Date   TSH 4.13 04/27/2019   No results found for: VITAMINB12 No results found for: FOLATE No results found for: IRON, TIBC, FERRITIN  Imaging and Procedures obtained prior to SNF admission: Patient was never admitted.  Assessment/Plan  Generalized weakness Vitals Stable Will Continue to follow her Q Shift  Check CBC,CMP  Also ESR and CRP Got Covid Vaccine 10 days ago. Has tested Negative for Covid in Routine testing  Right arm pain Not sure if it is due to Covid vaccine. Will check CRP to make sure her rheumatoid is not acitive Also Celebrex 200 mg QD for 3 days to see if it helps with pain  Rheumatoid arthritis, On Methotrexate Repeat Labs PAF (paroxysmal atrial fibrillation) (HCC) On Eliquis  Vascular dementia without behavioral disturbance (HCC) MMSE 24/30 ON Namenda QD Needs to up the dose but will wait for now   Age-related osteoporosis without current pathological fracture On Prolia Needs repeat DEXA  Depression with anxiety On Low dose of Buspar  Hyperlipidemia, unspecified hyperlipidemia type Continue Prevachol Repeat Fasting Lipid  Family/ staff Communication:   Labs/tests ordered:CMP,CBC,Lipid, ESR and CRP  Total time spent in this patient care encounter  was 45 _  minutes; greater than 50% of the visit spent counseling patient and staff, reviewing records , Labs and coordinating care for problems addressed at this encounter.

## 2019-12-08 LAB — BASIC METABOLIC PANEL
BUN: 21 (ref 4–21)
CO2: 25 — AB (ref 13–22)
Chloride: 108 (ref 99–108)
Creatinine: 1.1 (ref 0.5–1.1)
Glucose: 100
Potassium: 4 (ref 3.4–5.3)
Sodium: 141 (ref 137–147)

## 2019-12-08 LAB — HEPATIC FUNCTION PANEL
ALT: 11 (ref 7–35)
AST: 19 (ref 13–35)
Alkaline Phosphatase: 41 (ref 25–125)
Bilirubin, Total: 1

## 2019-12-08 LAB — LIPID PANEL
Cholesterol: 141 (ref 0–200)
HDL: 49 (ref 35–70)
LDL Cholesterol: 78
Triglycerides: 59 (ref 40–160)

## 2019-12-08 LAB — CBC AND DIFFERENTIAL
HCT: 37 (ref 36–46)
Hemoglobin: 12.2 (ref 12.0–16.0)
Platelets: 184 (ref 150–399)
WBC: 8

## 2019-12-11 ENCOUNTER — Encounter: Payer: Self-pay | Admitting: Internal Medicine

## 2019-12-11 ENCOUNTER — Non-Acute Institutional Stay: Payer: Medicare Other | Admitting: Internal Medicine

## 2019-12-11 DIAGNOSIS — I480 Paroxysmal atrial fibrillation: Secondary | ICD-10-CM | POA: Diagnosis not present

## 2019-12-11 DIAGNOSIS — R531 Weakness: Secondary | ICD-10-CM | POA: Diagnosis not present

## 2019-12-11 DIAGNOSIS — M069 Rheumatoid arthritis, unspecified: Secondary | ICD-10-CM

## 2019-12-11 DIAGNOSIS — F0150 Vascular dementia without behavioral disturbance: Secondary | ICD-10-CM

## 2019-12-11 DIAGNOSIS — W19XXXA Unspecified fall, initial encounter: Secondary | ICD-10-CM

## 2019-12-11 DIAGNOSIS — M79601 Pain in right arm: Secondary | ICD-10-CM | POA: Diagnosis not present

## 2019-12-11 DIAGNOSIS — F419 Anxiety disorder, unspecified: Secondary | ICD-10-CM

## 2019-12-11 NOTE — Progress Notes (Signed)
Location:  [place] Room Number: 804A Place of Service:  ALF ([phone]) Provider:  Rene Kocher. Lyndel Safe, MD   Virgie Dad, MD  Patient Care Team: Virgie Dad, MD as PCP - General (Internal Medicine) Pichardo-Geisinger, Mila Palmer, MD as Referring Physician Nestor Ramp, Effie Shy, MD as Referring Physician (Ophthalmology)  Extended Emergency Contact Information Primary Emergency Contact: Einar Grad Phone: [phone] Relation: Nephew Secondary Emergency Contact: Rhoderick Moody Mobile Phone: [phone] Relation: Niece  Code Status:  FULL Goals of care: Advanced Directive information Advanced Directives 12/06/2019  Does Patient Have a Medical Advance Directive? No  Type of Advance Directive -  Does patient want to make changes to medical advance directive? -  Would patient like information on creating a medical advance directive? No - Patient declined  Pre-existing out of facility DNR order (yellow form or pink MOST form) -     Chief Complaint  Patient presents with  . Follow-up    Follow up on fall and weakness    HPI:  Pt is a 86 y.o. female seen today for medical management of chronic diseases.    Patient has a history ofPAF on Eliquis, GERD, Rheumatoid arthritis, Hyperlipidemia, Osteoporosis and Dementia with Anxiety Patient was seen few days ago for weakness and pain in her right arm. She did get Covid vaccine a week before that not sure which arm  She had some labs done which have been within normal limits except her CRP is elevated at 21.   I had  started her on Celebrex daily to see if it helps her pain Patient today also  had a fall which looks more like a mechanical fall.  She did not sustain any injuries. She says today she is feeling much better denies any weakness and her pain is also improved   Past Medical History:  Diagnosis Date  . Acid reflux disease   . Arthritis    rheumatoid  . Atrial fibrillation ([place])   . Back  pain   . Bursitis of hip, right   . Cancer ([place])    skin  . Chest pain   . Confusion   . Constipation   . Fuchs' corneal dystrophy   . GERD (gastroesophageal reflux disease)   . Hard of hearing   . Knee pain   . Memory loss   . Paranoia ([place])   . Shoulder pain   . Venous insufficiency    Past Surgical History:  Procedure Laterality Date  . CORNEAL TRANSPLANT  2012   Dr Maudie Mercury   . RECONSTRUCTION OF EYELID     eyelid cancer 10 years ago Dr Victorino Dike   . RECONSTRUCTION OF NOSE     nose cancer/ Dr Nyoka Cowden 10 years ago     Allergies  Allergen Reactions  . Codeine   . Neosporin [Neomycin-Bacitracin Zn-Polymyx]   . Novocain [Procaine]   . Sulfamethoxazole-Trimethoprim   . Tape     Adhesive tape    Outpatient Encounter Medications as of 12/11/2019  Medication Sig  . apixaban (ELIQUIS) 2.5 MG TABS tablet Take 2.5 mg by mouth 2 (two) times daily.  . busPIRone (BUSPAR) 5 MG tablet Take 2.5 mg by mouth daily.  . Calcium Carbonate-Vit D-Min (CALCIUM 1200) 1200-1000 MG-UNIT CHEW Chew 1 tablet by mouth daily.  . celecoxib (CELEBREX) 200 MG capsule Take 200 mg by mouth daily as needed.  Marland Kitchen denosumab (PROLIA) 60 MG/ML SOSY injection Inject 60 mg into the skin every 6 (six) months. On the  13th day of every 6th month  . folic acid (FOLVITE) 1 MG tablet Take 1 mg by mouth daily.  . memantine (NAMENDA) 5 MG tablet Take 5 mg by mouth daily.  . methotrexate (RHEUMATREX) 2.5 MG tablet Take 15 mg by mouth once a week. Caution:Chemotherapy. Protect from light. Once a day on Friday take 6 tablets once weekly  . mineral oil-hydrophilic petrolatum (AQUAPHOR) ointment Apply 1 application topically 2 (two) times daily as needed for dry skin. Administer BID PRN to forehead, legs, and right mid back  . NON FORMULARY please apply cerave or equivalent to legs for dry skin  . Polyethyl Glycol-Propyl Glycol (SYSTANE) 0.4-0.3 % SOLN Place 1 drop into the right eye daily. Instill )1) drop into right eye   .  Polyethyl Glycol-Propyl Glycol 0.4-0.3 % SOLN Place 1 drop into the left eye 4 (four) times daily.   . pravastatin (PRAVACHOL) 20 MG tablet Take 20 mg by mouth daily.  . prednisoLONE acetate (PRED FORTE) 1 % ophthalmic suspension Place 1 drop into the right eye daily.  . sodium chloride (MURO 128) 5 % ophthalmic solution Place 1 drop into both eyes 4 (four) times daily.   No facility-administered encounter medications on file as of 12/11/2019.    Review of Systems  Review of Systems  Constitutional: Negative for activity change, appetite change, chills, diaphoresis, fatigue and fever.  HENT: Negative for mouth sores, postnasal drip, rhinorrhea, sinus pain and sore throat.   Respiratory: Negative for apnea, cough, chest tightness, shortness of breath and wheezing.   Cardiovascular: Negative for chest pain, palpitations and leg swelling.  Gastrointestinal: Negative for abdominal distention, abdominal pain, constipation, diarrhea, nausea and vomiting.  Genitourinary: Negative for dysuria and frequency.  Musculoskeletal: Negative for arthralgias, joint swelling and myalgias.  Skin: Negative for rash.  Neurological: Negative for dizziness, syncope, weakness, light-headedness and numbness.  Psychiatric/Behavioral: Negative for behavioral problems, confusion and sleep disturbance.     Immunization History  Administered Date(s) Administered  . Influenza-Unspecified 10/01/2014  . Pneumococcal Conjugate-13 10/21/2014  . Pneumococcal Polysaccharide-23 01/23/2018  . Td 04/18/2013  . Zoster 09/04/2012   Pertinent  Health Maintenance Due  Topic Date Due  . DEXA SCAN  07/26/1989  . INFLUENZA VACCINE  Completed  . PNA vac Low Risk Adult  Completed   No flowsheet data found. Functional Status Survey:    Vitals:   12/11/19 0948  BP: 120/70  Pulse: 60  Resp: 20  Temp: 97.9 F (36.6 C)  TempSrc: Oral  SpO2: 95%  Weight: 159 lb 6.4 oz (72.3 kg)  Height: 5\' 6"  (1.676 m)   Body mass  index is 25.73 kg/m. Physical Exam  Constitutional:Well-developed and well-nourished.  HENT:  Head: Normocephalic.  Mouth/Throat: Oropharynx is clear and moist.  Eyes: Pupils are equal, round, and reactive to light.  Neck: Neck supple.  Cardiovascular: Normal rate and normal heart sounds.  No murmur heard. Pulmonary/Chest: Effort normal and breath sounds normal. No respiratory distress. No wheezes. She has no rales.  Abdominal: Soft. Bowel sounds are normal. No distension. There is no tenderness. There is no rebound.  Musculoskeletal: No edema. No Pain today Lymphadenopathy: none Neurological:No Focal Deficits Skin: Skin is warm and dry.  Psychiatric: Normal mood and affect. Behavior is normal. Thought content normal.    Labs reviewed: Recent Labs    04/27/19 0000 12/08/19 0000  NA 142 141  K 4.6 4.0  CL 107 108  CO2 26 25*  BUN 19 21  CREATININE 1.0 1.1  CALCIUM 9.9  --    Recent Labs    04/27/19 0000 12/08/19 0000  AST 18 19  ALT 12 11  ALKPHOS 57 41  PROT 6.5  --   ALBUMIN 4.1  --    Recent Labs    04/27/19 0000 12/08/19 0000  WBC 5.8 8.0  HGB 12.6 12.2  HCT 38 37  PLT 227 184   Lab Results  Component Value Date   TSH 4.13 04/27/2019   No results found for: HGBA1C Lab Results  Component Value Date   CHOL 141 12/08/2019   HDL 49 12/08/2019   LDLCALC 78 12/08/2019   TRIG 59 12/08/2019    Significant Diagnostic Results in last 30 days:    Assessment/Plan  Generalized weakness Better Today ? Due to her Covid Vaccine Did have fall today but seems more Mechanical Will continue to monitor Lasbs all were Normal  Rheumatoid arthritis, involving unspecified site ON Methotrexate CRP was elevated at 21.5 Will Give her Celebrex QD for 1 week Repeat CRP in 2 weeks  Right arm pain Says much better Continue Celebrex  PAF (paroxysmal atrial fibrillation) (HCC) On Eliquis  Anxiety Discontinue Buspar Does not need it  Vascular dementia  without behavioral disturbance (HCC) Incrase Namenda to BID MMSE 24/30 Hyperlipidemia LDL 78 On prevachol CKD Creat 1.13 Osteoporosis On Prolia since 01/2016 and her T score at that time was 2.5 Repeat DEXA Family/ staff Communication:   Labs/tests ordered:  Repeat CRP and BMP  Total time spent in this patient care encounter was  45_  minutes; greater than 50% of the visit spent counseling patient and staff, reviewing records , Labs and coordinating care for problems addressed at this encounter.

## 2019-12-26 LAB — BASIC METABOLIC PANEL
BUN: 16 (ref 4–21)
CO2: 30 — AB (ref 13–22)
Chloride: 107 (ref 99–108)
Creatinine: 1 (ref 0.5–1.1)
Glucose: 91
Potassium: 4.3 (ref 3.4–5.3)
Sodium: 142 (ref 137–147)

## 2019-12-26 LAB — COMPREHENSIVE METABOLIC PANEL: Calcium: 9.3 (ref 8.7–10.7)

## 2020-02-20 ENCOUNTER — Encounter: Payer: Self-pay | Admitting: Nurse Practitioner

## 2020-02-20 ENCOUNTER — Non-Acute Institutional Stay: Payer: Medicare Other | Admitting: Nurse Practitioner

## 2020-02-20 DIAGNOSIS — M069 Rheumatoid arthritis, unspecified: Secondary | ICD-10-CM | POA: Diagnosis not present

## 2020-02-20 DIAGNOSIS — F0150 Vascular dementia without behavioral disturbance: Secondary | ICD-10-CM | POA: Diagnosis not present

## 2020-02-20 DIAGNOSIS — M810 Age-related osteoporosis without current pathological fracture: Secondary | ICD-10-CM

## 2020-02-20 DIAGNOSIS — I480 Paroxysmal atrial fibrillation: Secondary | ICD-10-CM | POA: Diagnosis not present

## 2020-02-20 DIAGNOSIS — K4090 Unilateral inguinal hernia, without obstruction or gangrene, not specified as recurrent: Secondary | ICD-10-CM

## 2020-02-20 DIAGNOSIS — F418 Other specified anxiety disorders: Secondary | ICD-10-CM

## 2020-02-20 LAB — C REACTIVE PROTEIN, FLUID: CRP: 7.1

## 2020-02-20 NOTE — Assessment & Plan Note (Addendum)
Her mood is stable, off Buspar

## 2020-02-20 NOTE — Assessment & Plan Note (Signed)
Heart rate is in control, continue Eliquis.  ?

## 2020-02-20 NOTE — Assessment & Plan Note (Signed)
Right, reducible, tennis ball sized, avoid constipation, surgeon consultation if HPOA desires.

## 2020-02-20 NOTE — Assessment & Plan Note (Signed)
Functioning well in AL [facility], continue Memantine for memory

## 2020-02-20 NOTE — Assessment & Plan Note (Addendum)
No recent fx, continue Prolia. DEXA was addressed in 11/2019 by Dr. Lyndel Safe.

## 2020-02-20 NOTE — Assessment & Plan Note (Addendum)
Stable, CRP was trended down from 20s to 7 in 2-3 weeks with scheduled Celebrex in 11/2019, ambulating with walker, continue Methotrexate, prn Celebrex.

## 2020-02-20 NOTE — Progress Notes (Signed)
Location:   [place] Room Number: [place] of Service:  ALF [phone]) Provider:  Lilyian Quayle, [place] NP  Virgie Dad, MD  Patient Care Team: Virgie Dad, MD as PCP - General (Internal Medicine) Pichardo-Geisinger, Mila Palmer, MD as Referring Physician Konrad Saha, MD as Referring Physician (Ophthalmology)  Extended Emergency Contact Information Primary Emergency Contact: Narda Bonds Mobile Phone: [phone] Relation: Nephew Secondary Emergency Contact: Rhoderick Moody Mobile Phone: [phone] Relation: Niece  Code Status:  Full Code Goals of care: Advanced Directive information Advanced Directives 12/06/2019  Does Patient Have a Medical Advance Directive? No  Type of Advance Directive -  Does patient want to make changes to medical advance directive? -  Would patient like information on creating a medical advance directive? No - Patient declined  Pre-existing out of facility DNR order (yellow form or pink MOST form) -     Chief Complaint  Patient presents with  . Medical Management of Chronic Issues  . Health Maintenance    Dexa scan    HPI:  Pt is a 86 y.o. female seen today for medical management of chronic diseases.    The patient resides in [facility] for safety, care assistance, ambulates with walker, on Memantine 5mg  bid for memory, her mood is stable, off Buspar, Hx of Afib, heart rate is in control, on Eliquis 2.5mg  bid. RA, stable, on prn Celebrex, Methotrexate 15mg  weekly. OP, no recent fx, on Prolia, DEXA addressed by Dr. Lyndel Safe in 11/2019.    Past Medical History:  Diagnosis Date  . Acid reflux disease   . Arthritis    rheumatoid  . Atrial fibrillation ([place])   . Back pain   . Bursitis of hip, right   . Cancer ([place])    skin  . Chest pain   . Confusion   . Constipation   . Fuchs' corneal dystrophy   . GERD (gastroesophageal reflux disease)   . Hard of hearing   . Knee pain   . Memory loss   . Paranoia ([place])   .  Shoulder pain   . Venous insufficiency    Past Surgical History:  Procedure Laterality Date  . CORNEAL TRANSPLANT  2012   Dr Maudie Mercury   . RECONSTRUCTION OF EYELID     eyelid cancer 10 years ago Dr Victorino Dike   . RECONSTRUCTION OF NOSE     nose cancer/ Dr Nyoka Cowden 10 years ago     Allergies  Allergen Reactions  . Codeine   . Neosporin [Neomycin-Bacitracin Zn-Polymyx]   . Novocain [Procaine]   . Sulfamethoxazole-Trimethoprim   . Tape     Adhesive tape    Allergies as of 02/20/2020      Reactions   Codeine    Neosporin [neomycin-bacitracin Zn-polymyx]    Novocain [procaine]    Sulfamethoxazole-trimethoprim    Tape    Adhesive tape      Medication List       Accurate as of February 20, 2020  1:11 PM. If you have any questions, ask your nurse or doctor.        STOP taking these medications   busPIRone 5 MG tablet Commonly known as: BUSPAR Stopped by: Lauraann Missey X Gaylyn Berish, NP     TAKE these medications   Calcium 1200 1200-1000 MG-UNIT Chew Chew 1 tablet by mouth daily.   celecoxib 200 MG capsule Commonly known as: CELEBREX Take 200 mg by mouth daily as needed.   denosumab 60 MG/ML Sosy injection Commonly known as:  PROLIA Inject 60 mg into the skin every 6 (six) months. On the 13th day of every 6th month   Eliquis 2.5 MG Tabs tablet Generic drug: apixaban Take 2.5 mg by mouth 2 (two) times daily.   folic acid 1 MG tablet Commonly known as: FOLVITE Take 1 mg by mouth daily.   memantine 5 MG tablet Commonly known as: NAMENDA Take 5 mg by mouth 2 (two) times daily.   methotrexate 2.5 MG tablet Commonly known as: RHEUMATREX Take 15 mg by mouth once a week. Caution:Chemotherapy. Protect from light. Once a day on Friday take 6 tablets once weekly   mineral oil-hydrophilic petrolatum ointment Apply 1 application topically 2 (two) times daily as needed for dry skin. Administer BID PRN to forehead, legs, and right mid back   NON FORMULARY please apply cerave or equivalent to  legs for dry skin   Polyethyl Glycol-Propyl Glycol 0.4-0.3 % Soln Place 1 drop into the left eye 4 (four) times daily.   Systane 0.4-0.3 % Soln Generic drug: Polyethyl Glycol-Propyl Glycol Place 1 drop into the right eye daily. Instill )1) drop into right eye   pravastatin 20 MG tablet Commonly known as: PRAVACHOL Take 20 mg by mouth daily.   prednisoLONE acetate 1 % ophthalmic suspension Commonly known as: PRED FORTE Place 1 drop into the right eye daily.   sodium chloride 5 % ophthalmic solution Commonly known as: MURO 128 Place 1 drop into both eyes 4 (four) times daily.      ROS was provided with assistance of staff.  Review of Systems  Constitutional: Negative for activity change, appetite change, diaphoresis, fatigue, fever and unexpected weight change.  HENT: Positive for hearing loss. Negative for congestion and voice change.   Eyes: Negative for visual disturbance.  Respiratory: Negative for cough and shortness of breath.   Cardiovascular: Negative for chest pain, palpitations and leg swelling.  Gastrointestinal: Negative for abdominal distention, abdominal pain, constipation and diarrhea.  Genitourinary: Negative for difficulty urinating, dysuria and urgency.  Musculoskeletal: Positive for arthralgias and gait problem.  Skin: Negative for color change.  Neurological: Negative for dizziness, speech difficulty, weakness and headaches.       Dementia  Psychiatric/Behavioral: Positive for confusion. Negative for agitation, behavioral problems and sleep disturbance. The patient is not nervous/anxious.     Immunization History  Administered Date(s) Administered  . Influenza, High Dose Seasonal PF 08/25/2019  . Influenza-Unspecified 10/01/2014  . Moderna SARS-COVID-2 Vaccination 11/24/2019, 12/22/2019  . Pneumococcal Conjugate-13 10/21/2014  . Pneumococcal Polysaccharide-23 01/23/2018  . Td 04/18/2013  . Zoster 09/04/2012   Pertinent  Health Maintenance Due  Topic  Date Due  . DEXA SCAN  Never done  . INFLUENZA VACCINE  Completed  . PNA vac Low Risk Adult  Completed   No flowsheet data found. Functional Status Survey:    Vitals:   02/20/20 0902  BP: (!) 150/82  Pulse: 77  Resp: 18  Temp: 98.2 F (36.8 C)  SpO2: 94%  Weight: 158 lb 6.4 oz (71.8 kg)  Height: 5\' 6"  (1.676 m)   Body mass index is 25.57 kg/m. Physical Exam Vitals and nursing note reviewed.  Constitutional:      General: She is not in acute distress.    Appearance: Normal appearance. She is not ill-appearing.  HENT:     Head: Normocephalic and atraumatic.     Mouth/Throat:     Mouth: Mucous membranes are moist.  Eyes:     Extraocular Movements: Extraocular movements intact.  Conjunctiva/sclera: Conjunctivae normal.     Pupils: Pupils are equal, round, and reactive to light.  Cardiovascular:     Rate and Rhythm: Normal rate and regular rhythm.  Pulmonary:     Breath sounds: No rhonchi or rales.  Abdominal:     General: Bowel sounds are normal. There is no distension.     Palpations: Abdomen is soft.     Tenderness: There is no abdominal tenderness. There is no right CVA tenderness, left CVA tenderness, guarding or rebound.     Hernia: A hernia is present.     Comments: Right inguinal hernia, reducible, a tennis ball sized   Musculoskeletal:     Cervical back: Normal range of motion and neck supple.     Right lower leg: No edema.     Left lower leg: No edema.  Skin:    General: Skin is warm and dry.     Comments: Right temple/forehead healed removed skin lesion.   Neurological:     General: No focal deficit present.     Mental Status: She is alert. Mental status is at baseline.     Motor: No weakness.     Coordination: Coordination normal.     Gait: Gait abnormal.     Comments: Oriented to person, place.   Psychiatric:        Mood and Affect: Mood normal.        Behavior: Behavior normal.     Comments: Pleasantly confused.      Labs reviewed: Recent  Labs    04/27/19 0000 12/08/19 0000 12/26/19 0000  NA 142 141 142  K 4.6 4.0 4.3  CL 107 108 107  CO2 26 25* 30*  BUN 19 21 16   CREATININE 1.0 1.1 1.0  CALCIUM 9.9  --  9.3   Recent Labs    04/27/19 0000 12/08/19 0000  AST 18 19  ALT 12 11  ALKPHOS 57 41  PROT 6.5  --   ALBUMIN 4.1  --    Recent Labs    04/27/19 0000 12/08/19 0000  WBC 5.8 8.0  HGB 12.6 12.2  HCT 38 37  PLT 227 184   Lab Results  Component Value Date   TSH 4.13 04/27/2019   No results found for: HGBA1C Lab Results  Component Value Date   CHOL 141 12/08/2019   HDL 49 12/08/2019   LDLCALC 78 12/08/2019   TRIG 59 12/08/2019    Significant Diagnostic Results in last 30 days:  No results found.  Assessment/Plan PAF (paroxysmal atrial fibrillation) (HCC) Heart rate is in control, continue Eliquis  Vascular dementia ([place]) Functioning well in [facility], continue Memantine for memory  Osteoporosis No recent fx, continue Prolia. DEXA was addressed in 11/2019 by Dr. Lyndel Safe.   Rheumatoid arthritis (HCC) Stable, CRP was trended down from 20s to 7 in 2-3 weeks with scheduled Celebrex in 11/2019, ambulating with walker, continue Methotrexate, prn Celebrex.   Depression with anxiety Her mood is stable, off Buspar  Right inguinal hernia Right, reducible, tennis ball sized, avoid constipation, surgeon consultation if HPOA desires.    Family/ staff Communication: plan of care reviewed with the patient and charge nurse.   Labs/tests ordered:  None  Time spend 40 minutes.

## 2020-04-22 ENCOUNTER — Non-Acute Institutional Stay: Payer: Medicare Other | Admitting: Internal Medicine

## 2020-04-22 ENCOUNTER — Encounter: Payer: Self-pay | Admitting: Internal Medicine

## 2020-04-22 DIAGNOSIS — M069 Rheumatoid arthritis, unspecified: Secondary | ICD-10-CM | POA: Diagnosis not present

## 2020-04-22 DIAGNOSIS — F0151 Vascular dementia with behavioral disturbance: Secondary | ICD-10-CM | POA: Diagnosis not present

## 2020-04-22 DIAGNOSIS — I480 Paroxysmal atrial fibrillation: Secondary | ICD-10-CM | POA: Diagnosis not present

## 2020-04-22 DIAGNOSIS — F01518 Vascular dementia, unspecified severity, with other behavioral disturbance: Secondary | ICD-10-CM

## 2020-04-22 DIAGNOSIS — M810 Age-related osteoporosis without current pathological fracture: Secondary | ICD-10-CM

## 2020-04-22 NOTE — Progress Notes (Signed)
Location:    [place] Room Number: [place] of Service:  ALF [phone]) Provider:  Veleta Miners MD  Virgie Dad, MD  Patient Care Team: Virgie Dad, MD as PCP - General (Internal Medicine) Pichardo-Geisinger, Mila Palmer, MD as Referring Physician Konrad Saha, MD as Referring Physician (Ophthalmology)  Extended Emergency Contact Information Primary Emergency Contact: Einar Grad Phone: [phone] Relation: Nephew Secondary Emergency Contact: Rhoderick Moody Mobile Phone: [phone] Relation: Niece  Code Status:  Full Code Goals of care: Advanced Directive information Advanced Directives 12/06/2019  Does Patient Have a Medical Advance Directive? No  Type of Advance Directive -  Does patient want to make changes to medical advance directive? -  Would patient like information on creating a medical advance directive? No - Patient declined  Pre-existing out of facility DNR order (yellow form or pink MOST form) -     Chief Complaint  Patient presents with  . Acute Visit    Behavior issues    HPI:  Pt is a 86 y.o. female seen today for an acute visit for behavior issues  Patient has a history ofPAF on Eliquis, GERD, Rheumatoid arthritis, Hyperlipidemia, Osteoporosis and Dementia with Anxiety  Patient lives in [place] and for last few weeks has been having some behavior issues including agitation against the staff and other residents. Today in the lunchroom patient got very upset with the staff.  It also scared the other residents. Patient does not remember much she states she did have some disagreement. Patient does have history of anxiety with her dementia and was on BuSpar before which was discontinued few months ago. She did not have any other complaints today.  No fever or chills or dysuria or abdominal pain Past Medical History:  Diagnosis Date  . Acid reflux disease   . Arthritis    rheumatoid  . Atrial fibrillation ([place])     . Back pain   . Bursitis of hip, right   . Cancer ([place])    skin  . Chest pain   . Confusion   . Constipation   . Fuchs' corneal dystrophy   . GERD (gastroesophageal reflux disease)   . Hard of hearing   . Knee pain   . Memory loss   . Paranoia ([place])   . Shoulder pain   . Venous insufficiency    Past Surgical History:  Procedure Laterality Date  . CORNEAL TRANSPLANT  2012   Dr Maudie Mercury   . RECONSTRUCTION OF EYELID     eyelid cancer 10 years ago Dr Victorino Dike   . RECONSTRUCTION OF NOSE     nose cancer/ Dr Nyoka Cowden 10 years ago     Allergies  Allergen Reactions  . Codeine   . Neosporin [Neomycin-Bacitracin Zn-Polymyx]   . Novocain [Procaine]   . Sulfamethoxazole-Trimethoprim   . Tape     Adhesive tape    Allergies as of 04/22/2020      Reactions   Codeine    Neosporin [neomycin-bacitracin Zn-polymyx]    Novocain [procaine]    Sulfamethoxazole-trimethoprim    Tape    Adhesive tape      Medication List       Accurate as of April 22, 2020  4:09 PM. If you have any questions, ask your nurse or doctor.        STOP taking these medications   celecoxib 200 MG capsule Commonly known as: CELEBREX Stopped by: Virgie Dad, MD     TAKE these medications  Calcium 1200 1200-1000 MG-UNIT Chew Chew 1 tablet by mouth daily.   denosumab 60 MG/ML Sosy injection Commonly known as: PROLIA Inject 60 mg into the skin every 6 (six) months. On the 13th day of every 6th month   Eliquis 2.5 MG Tabs tablet Generic drug: apixaban Take 2.5 mg by mouth 2 (two) times daily.   folic acid 1 MG tablet Commonly known as: FOLVITE Take 1 mg by mouth daily.   memantine 5 MG tablet Commonly known as: NAMENDA Take 5 mg by mouth 2 (two) times daily.   methotrexate 2.5 MG tablet Commonly known as: RHEUMATREX Take 15 mg by mouth once a week. Caution:Chemotherapy. Protect from light. Once a day on Friday take 6 tablets once weekly   mineral oil-hydrophilic petrolatum ointment Apply 1  application topically 2 (two) times daily as needed for dry skin. Administer BID PRN to forehead, legs, and right mid back   NON FORMULARY please apply cerave or equivalent to legs for dry skin   Polyethyl Glycol-Propyl Glycol 0.4-0.3 % Soln Place 1 drop into the left eye 4 (four) times daily.   Systane 0.4-0.3 % Soln Generic drug: Polyethyl Glycol-Propyl Glycol Place 1 drop into the right eye daily. Instill )1) drop into right eye   pravastatin 20 MG tablet Commonly known as: PRAVACHOL Take 20 mg by mouth daily.   prednisoLONE acetate 1 % ophthalmic suspension Commonly known as: PRED FORTE Place 1 drop into the right eye daily.   sodium chloride 5 % ophthalmic solution Commonly known as: MURO 128 Place 1 drop into both eyes 4 (four) times daily.       Review of Systems  Review of Systems  Constitutional: Negative for activity change, appetite change, chills, diaphoresis, fatigue and fever.  HENT: Negative for mouth sores, postnasal drip, rhinorrhea, sinus pain and sore throat.   Respiratory: Negative for apnea, cough, chest tightness, shortness of breath and wheezing.   Cardiovascular: Negative for chest pain, palpitations and leg swelling.  Gastrointestinal: Negative for abdominal distention, abdominal pain, constipation, diarrhea, nausea and vomiting.  Genitourinary: Negative for dysuria and frequency.  Musculoskeletal: Negative for arthralgias, joint swelling and myalgias.  Skin: Negative for rash.  Neurological: Negative for dizziness, syncope, weakness, light-headedness and numbness.  Psychiatric/Behavioral: Positive for behavioral problems, confusion      Immunization History  Administered Date(s) Administered  . Influenza, High Dose Seasonal PF 08/25/2019  . Influenza-Unspecified 10/01/2014  . Moderna SARS-COVID-2 Vaccination 11/24/2019, 12/22/2019  . Pneumococcal Conjugate-13 10/21/2014  . Pneumococcal Polysaccharide-23 01/23/2018  . Td 04/18/2013  . Zoster  09/04/2012   Pertinent  Health Maintenance Due  Topic Date Due  . DEXA SCAN  Never done  . INFLUENZA VACCINE  06/22/2020  . PNA vac Low Risk Adult  Completed   No flowsheet data found. Functional Status Survey:    Vitals:   04/22/20 1605  BP: 130/70  Pulse: 66  Resp: 18  Temp: (!) 97.5 F (36.4 C)  SpO2: 96%  Weight: 159 lb 9.6 oz (72.4 kg)  Height: 5\' 6"  (1.676 m)   Body mass index is 25.76 kg/m. Physical Exam  Constitutional: . Well-developed and well-nourished.  HENT:  Head: Normocephalic.  Mouth/Throat: Oropharynx is clear and moist.  Eyes: Pupils are equal, round, and reactive to light.  Neck: Neck supple.  Cardiovascular: Normal rate and normal heart sounds.  No murmur heard. Pulmonary/Chest: Effort normal and breath sounds normal. No respiratory distress. No wheezes. She has no rales.  Abdominal: Soft. Bowel sounds are normal.  No distension. There is no tenderness. There is no rebound.  Musculoskeletal: No edema.  Lymphadenopathy: none Neurological: Alert .  No focal deficit Skin: Skin is warm and dry.  Psychiatric: Normal mood and affect. Behavior is normal. Thought content normal.    Labs reviewed: Recent Labs    04/27/19 0000 12/08/19 0000 12/26/19 0000  NA 142 141 142  K 4.6 4.0 4.3  CL 107 108 107  CO2 26 25* 30*  BUN 19 21 16   CREATININE 1.0 1.1 1.0  CALCIUM 9.9  --  9.3   Recent Labs    04/27/19 0000 12/08/19 0000  AST 18 19  ALT 12 11  ALKPHOS 57 41  PROT 6.5  --   ALBUMIN 4.1  --    Recent Labs    04/27/19 0000 12/08/19 0000  WBC 5.8 8.0  HGB 12.6 12.2  HCT 38 37  PLT 227 184   Lab Results  Component Value Date   TSH 4.13 04/27/2019   No results found for: HGBA1C Lab Results  Component Value Date   CHOL 141 12/08/2019   HDL 49 12/08/2019   LDLCALC 78 12/08/2019   TRIG 59 12/08/2019    Significant Diagnostic Results in last 30 days:  No results found.  Assessment/Plan Mixed dementia with behavior issues MMSE  24/30 in the past We will increase her Namenda to 10 mg twice daily Also start her on BuSpar 2.5 mg every morning Patient has done well on this before We will also repeat labs including CBC CMP and a TSH Rheumatoid arthritis On methotrexate Labs have been stable PAF On Eliquis Hyperlipidemia LDL 78 on statin Osteoporosis  On Prolia Depression with anxiety We will continue on BuSpar   Family/ staff Communication:   Labs/tests ordered:

## 2020-04-24 LAB — BASIC METABOLIC PANEL
BUN: 13 (ref 4–21)
CO2: 29 — AB (ref 13–22)
Chloride: 108 (ref 99–108)
Creatinine: 1 (ref 0.5–1.1)
Glucose: 89
Potassium: 4.2 (ref 3.4–5.3)
Sodium: 142 (ref 137–147)

## 2020-04-24 LAB — HEPATIC FUNCTION PANEL
ALT: 11 (ref 7–35)
AST: 17 (ref 13–35)
Alkaline Phosphatase: 41 (ref 25–125)
Bilirubin, Total: 0.8

## 2020-04-24 LAB — CBC: RBC: 3.56 — AB (ref 3.87–5.11)

## 2020-04-24 LAB — TSH: TSH: 2.45 (ref 0.41–5.90)

## 2020-04-24 LAB — COMPREHENSIVE METABOLIC PANEL: Calcium: 9.5 (ref 8.7–10.7)

## 2020-04-24 LAB — CBC AND DIFFERENTIAL
HCT: 34 — AB (ref 36–46)
Hemoglobin: 11.3 — AB (ref 12.0–16.0)
Platelets: 186 (ref 150–399)
WBC: 4.6

## 2020-04-24 LAB — VITAMIN D 25 HYDROXY (VIT D DEFICIENCY, FRACTURES): Vit D, 25-Hydroxy: 32

## 2020-05-02 ENCOUNTER — Non-Acute Institutional Stay: Payer: Medicare Other | Admitting: Internal Medicine

## 2020-05-02 ENCOUNTER — Encounter: Payer: Self-pay | Admitting: Internal Medicine

## 2020-05-02 DIAGNOSIS — I480 Paroxysmal atrial fibrillation: Secondary | ICD-10-CM

## 2020-05-02 DIAGNOSIS — M069 Rheumatoid arthritis, unspecified: Secondary | ICD-10-CM

## 2020-05-02 DIAGNOSIS — F01518 Vascular dementia, unspecified severity, with other behavioral disturbance: Secondary | ICD-10-CM

## 2020-05-02 DIAGNOSIS — M810 Age-related osteoporosis without current pathological fracture: Secondary | ICD-10-CM

## 2020-05-02 DIAGNOSIS — F418 Other specified anxiety disorders: Secondary | ICD-10-CM | POA: Diagnosis not present

## 2020-05-02 DIAGNOSIS — F0151 Vascular dementia with behavioral disturbance: Secondary | ICD-10-CM

## 2020-05-02 DIAGNOSIS — E785 Hyperlipidemia, unspecified: Secondary | ICD-10-CM

## 2020-05-02 NOTE — Progress Notes (Addendum)
Location:   [place] Room Number: [place] of Service:  ALF [phone]) Provider:  Veleta Miners MD   Virgie Dad, MD  Patient Care Team: Virgie Dad, MD as PCP - General (Internal Medicine) Pichardo-Geisinger, Mila Palmer, MD as Referring Physician Konrad Saha, MD as Referring Physician (Ophthalmology)  Extended Emergency Contact Information Primary Emergency Contact: Einar Grad Phone: [phone] Relation: Nephew Secondary Emergency Contact: Rhoderick Moody Mobile Phone: [phone] Relation: Niece  Code Status:  Full Code Goals of care: Advanced Directive information Advanced Directives 12/06/2019  Does Patient Have a Medical Advance Directive? No  Type of Advance Directive -  Does patient want to make changes to medical advance directive? -  Would patient like information on creating a medical advance directive? No - Patient declined  Pre-existing out of facility DNR order (yellow form or pink MOST form) -     Chief Complaint  Patient presents with  . Acute Visit    Behavior issues    HPI:  Pt is a 86 y.o. female seen today for an acute visit for Behavior issues, Aggressive towards staff and talk to POA  Patient has a history ofPAF on Eliquis, GERD, Rheumatoid arthritis, Hyperlipidemia, Osteoporosis and Dementiawith Anxiety Patient lives in [place]  For last few weeks patient has been having behavior issues including agitation against the staff and other residents. Patient's POA got called couple of days ago due to some confrontation with the patient and the residents.  Her POA wanted to talk to me today .  I had started patient on BuSpar 2.5 mg.  But she continues to have these behaviors.  She wanted to know if we can go up on the BuSpar.  She also wanted to know if patient is depressed. Patient was very pleasant.   I asked her about depression she said she probably is.  But then she repeats herself and is unable to give a  definite answer. She is doing well otherwise eating well walking with her walker and has not had any falls.  Past Medical History:  Diagnosis Date  . Acid reflux disease   . Arthritis    rheumatoid  . Atrial fibrillation ([place])   . Back pain   . Bursitis of hip, right   . Cancer ([place])    skin  . Chest pain   . Confusion   . Constipation   . Fuchs' corneal dystrophy   . GERD (gastroesophageal reflux disease)   . Hard of hearing   . Knee pain   . Memory loss   . Paranoia ([place])   . Shoulder pain   . Venous insufficiency    Past Surgical History:  Procedure Laterality Date  . CORNEAL TRANSPLANT  2012   Dr Maudie Mercury   . RECONSTRUCTION OF EYELID     eyelid cancer 10 years ago Dr Victorino Dike   . RECONSTRUCTION OF NOSE     nose cancer/ Dr Nyoka Cowden 10 years ago     Allergies  Allergen Reactions  . Codeine   . Neosporin [Neomycin-Bacitracin Zn-Polymyx]   . Novocain [Procaine]   . Sulfamethoxazole-Trimethoprim   . Tape     Adhesive tape    Allergies as of 05/02/2020      Reactions   Codeine    Neosporin [neomycin-bacitracin Zn-polymyx]    Novocain [procaine]    Sulfamethoxazole-trimethoprim    Tape    Adhesive tape      Medication List       Accurate  as of May 02, 2020  3:29 PM. If you have any questions, ask your nurse or doctor.        BUSPIRONE HCL PO Take 2.5 mg by mouth. Once a day   Calcium 1200 1200-1000 MG-UNIT Chew Chew 1 tablet by mouth daily.   denosumab 60 MG/ML Sosy injection Commonly known as: PROLIA Inject 60 mg into the skin every 6 (six) months. On the 13th day of every 6th month   Eliquis 2.5 MG Tabs tablet Generic drug: apixaban Take 2.5 mg by mouth 2 (two) times daily.   folic acid 1 MG tablet Commonly known as: FOLVITE Take 1 mg by mouth daily.   memantine 10 MG tablet Commonly known as: NAMENDA Take 10 mg by mouth 2 (two) times daily.   methotrexate 2.5 MG tablet Commonly known as: RHEUMATREX Take 15 mg by mouth once a week.  Caution:Chemotherapy. Protect from light. Once a day on Friday take 6 tablets once weekly   mineral oil-hydrophilic petrolatum ointment Apply 1 application topically 2 (two) times daily as needed for dry skin. Administer BID PRN to forehead, legs, and right mid back   NON FORMULARY please apply cerave or equivalent to legs for dry skin   pravastatin 20 MG tablet Commonly known as: PRAVACHOL Take 20 mg by mouth daily.   prednisoLONE acetate 1 % ophthalmic suspension Commonly known as: PRED FORTE Place 1 drop into the right eye daily.   sodium chloride 5 % ophthalmic solution Commonly known as: MURO 128 Place 1 drop into both eyes 4 (four) times daily.   Systane 0.4-0.3 % Soln Generic drug: Polyethyl Glycol-Propyl Glycol Place 1 drop into the right eye daily. Instill )1) drop into right eye What changed: Another medication with the same name was removed. Continue taking this medication, and follow the directions you see here. Changed by: Virgie Dad, MD       Review of Systems  Constitutional: Positive for activity change.  HENT: Negative.   Respiratory: Negative.   Cardiovascular: Positive for leg swelling.  Gastrointestinal: Negative.   Genitourinary: Negative.   Musculoskeletal: Negative.   Skin: Negative.   Neurological: Negative.   Psychiatric/Behavioral: Positive for agitation, behavioral problems, confusion and dysphoric mood. The patient is nervous/anxious.     Immunization History  Administered Date(s) Administered  . Influenza, High Dose Seasonal PF 08/25/2019  . Influenza-Unspecified 10/01/2014  . Moderna SARS-COVID-2 Vaccination 11/24/2019, 12/22/2019  . Pneumococcal Conjugate-13 10/21/2014  . Pneumococcal Polysaccharide-23 01/23/2018  . Td 04/18/2013  . Zoster 09/04/2012   Pertinent  Health Maintenance Due  Topic Date Due  . DEXA SCAN  Never done  . INFLUENZA VACCINE  06/22/2020  . PNA vac Low Risk Adult  Completed   No flowsheet data  found. Functional Status Survey:    Vitals:   05/02/20 1523  BP: (!) 148/62  Pulse: 68  Resp: 16  Temp: 98.6 F (37 C)  SpO2: 94%  Weight: 159 lb 9.6 oz (72.4 kg)  Height: 5\' 6"  (1.676 m)   Body mass index is 25.76 kg/m. Physical Exam  Constitutional:  Well-developed and well-nourished.  HENT:  Head: Normocephalic.  Mouth/Throat: Oropharynx is clear and moist.  Eyes: Pupils are equal, round, and reactive to light.  Neck: Neck supple.  Cardiovascular: Normal rate and normal heart sounds.  No murmur heard. Pulmonary/Chest: Effort normal and breath sounds normal. No respiratory distress. No wheezes. She has no rales.  Abdominal: Soft. Bowel sounds are normal. No distension. There is no tenderness. There  is no rebound.  Musculoskeletal: Trace Edema Lymphadenopathy: none Neurological: No Focal Deficits Walks with the walker Skin: Skin is warm and dry.  Psychiatric: Normal mood and affect. Behavior is normal. Thought content normal.    Labs reviewed: Recent Labs    12/08/19 0000 12/26/19 0000 04/24/20 0000  NA 141 142 142  K 4.0 4.3 4.2  CL 108 107 108  CO2 25* 30* 29*  BUN 21 16 13   CREATININE 1.1 1.0 1.0  CALCIUM  --  9.3 9.5   Recent Labs    12/08/19 0000 04/24/20 0000  AST 19 17  ALT 11 11  ALKPHOS 41 41   Recent Labs    12/08/19 0000 04/24/20 0000  WBC 8.0 4.6  HGB 12.2 11.3*  HCT 37 34*  PLT 184 186   Lab Results  Component Value Date   TSH 2.45 04/24/2020   No results found for: HGBA1C Lab Results  Component Value Date   CHOL 141 12/08/2019   HDL 49 12/08/2019   LDLCALC 78 12/08/2019   TRIG 59 12/08/2019    Significant Diagnostic Results in last 30 days:  No results found.  Assessment/Plan Vascular dementia with behavior disturbance ([place]) Discussed in detail with her POA. This is part of her Dementia and possible Depression Will increase the Buspar to 5 mg QAM Namenda already increased to 10 mg BID  Depression with anxiety Can  be the cause of her agitation Start on Zoloft 25 mg  PAF (paroxysmal atrial fibrillation) (HCC) Continue on Eliquis Rheumatoid arthritis,  ([place]) On Methotrexate  LFTS good  Age-related osteoporosis without current pathological fracture Prolia per rheumatology Hyperlipidemia,  LDL 78 on Statin    Family/ staff Communication:   Labs/tests ordered:   Total time spent in this patient care encounter was  45_  minutes; greater than 50% of the visit spent counseling patient and staff, reviewing records , Labs and coordinating care for problems addressed at this encounter.

## 2020-05-27 ENCOUNTER — Non-Acute Institutional Stay: Payer: Medicare Other | Admitting: Internal Medicine

## 2020-05-27 ENCOUNTER — Encounter: Payer: Self-pay | Admitting: Internal Medicine

## 2020-05-27 DIAGNOSIS — M810 Age-related osteoporosis without current pathological fracture: Secondary | ICD-10-CM

## 2020-05-27 DIAGNOSIS — E785 Hyperlipidemia, unspecified: Secondary | ICD-10-CM

## 2020-05-27 DIAGNOSIS — I480 Paroxysmal atrial fibrillation: Secondary | ICD-10-CM | POA: Diagnosis not present

## 2020-05-27 DIAGNOSIS — M069 Rheumatoid arthritis, unspecified: Secondary | ICD-10-CM

## 2020-05-27 DIAGNOSIS — F418 Other specified anxiety disorders: Secondary | ICD-10-CM

## 2020-05-27 DIAGNOSIS — F0151 Vascular dementia with behavioral disturbance: Secondary | ICD-10-CM

## 2020-05-27 DIAGNOSIS — F01518 Vascular dementia, unspecified severity, with other behavioral disturbance: Secondary | ICD-10-CM

## 2020-05-27 NOTE — Progress Notes (Signed)
Location:   [place] Room Number: [place] of Service:  ALF [phone]) Provider:  Veleta Miners MD  Virgie Dad, MD  Patient Care Team: Virgie Dad, MD as PCP - General (Internal Medicine) Pichardo-Geisinger, Mila Palmer, MD as Referring Physician Konrad Saha, MD as Referring Physician (Ophthalmology)  Extended Emergency Contact Information Primary Emergency Contact: Einar Grad Phone: [phone] Relation: Nephew Secondary Emergency Contact: Rhoderick Moody Mobile Phone: [phone] Relation: Niece  Code Status:  Full Code Goals of care: Advanced Directive information Advanced Directives 12/06/2019  Does Patient Have a Medical Advance Directive? No  Type of Advance Directive -  Does patient want to make changes to medical advance directive? -  Would patient like information on creating a medical advance directive? No - Patient declined  Pre-existing out of facility DNR order (yellow form or pink MOST form) -     Chief Complaint  Patient presents with  . Acute Visit    Agitated    HPI:  Pt is a 86 y.o. female seen today for an acute visit for behavior issues  Patient has a history ofPAF on Eliquis, GERD, Rheumatoid arthritis, Hyperlipidemia, Osteoporosis and Dementiawith Anxiety Patient lives in [place]  Patient has history of dementia.  Started on BuSpar to see if it helps her agitation.  Patient continues to be very aggressive.  Over the weekend she was trying to hit the staff.  Trying to hit the maintenance staff.  Also very aggressive towards staff the resident No other issues continues to be asymptomatic with no fever cough. Continues to walk with her walker.  No recent falls When discussed with the patient she said that she gets depressed sometimes her family has not been able to come and see her.  She does not remember being Aggressive towards other residents Past Medical History:  Diagnosis Date  . Acid reflux disease     . Arthritis    rheumatoid  . Atrial fibrillation ([place])   . Back pain   . Bursitis of hip, right   . Cancer ([place])    skin  . Chest pain   . Confusion   . Constipation   . Fuchs' corneal dystrophy   . GERD (gastroesophageal reflux disease)   . Hard of hearing   . Knee pain   . Memory loss   . Paranoia ([place])   . Shoulder pain   . Venous insufficiency    Past Surgical History:  Procedure Laterality Date  . CORNEAL TRANSPLANT  2012   Dr Maudie Mercury   . RECONSTRUCTION OF EYELID     eyelid cancer 10 years ago Dr Victorino Dike   . RECONSTRUCTION OF NOSE     nose cancer/ Dr Nyoka Cowden 10 years ago     Allergies  Allergen Reactions  . Codeine   . Neosporin [Neomycin-Bacitracin Zn-Polymyx]   . Novocain [Procaine]   . Sulfamethoxazole-Trimethoprim   . Tape     Adhesive tape    Allergies as of 05/27/2020      Reactions   Codeine    Neosporin [neomycin-bacitracin Zn-polymyx]    Novocain [procaine]    Sulfamethoxazole-trimethoprim    Tape    Adhesive tape      Medication List       Accurate as of May 27, 2020  2:16 PM. If you have any questions, ask your nurse or doctor.        BUSPIRONE HCL PO Take 5 mg by mouth. Once a day   Calcium  1200 1200-1000 MG-UNIT Chew Chew 1 tablet by mouth daily.   denosumab 60 MG/ML Sosy injection Commonly known as: PROLIA Inject 60 mg into the skin every 6 (six) months. On the 13th day of every 6th month   Eliquis 2.5 MG Tabs tablet Generic drug: apixaban Take 2.5 mg by mouth 2 (two) times daily.   folic acid 1 MG tablet Commonly known as: FOLVITE Take 1 mg by mouth daily.   memantine 10 MG tablet Commonly known as: NAMENDA Take 10 mg by mouth 2 (two) times daily.   methotrexate 2.5 MG tablet Commonly known as: RHEUMATREX Take 15 mg by mouth once a week. Caution:Chemotherapy. Protect from light. Once a day on Friday take 6 tablets once weekly   mineral oil-hydrophilic petrolatum ointment Apply 1 application topically 2 (two) times  daily as needed for dry skin. Administer BID PRN to forehead, legs, and right mid back   NON FORMULARY please apply cerave or equivalent to legs for dry skin   pravastatin 20 MG tablet Commonly known as: PRAVACHOL Take 20 mg by mouth daily.   prednisoLONE acetate 1 % ophthalmic suspension Commonly known as: PRED FORTE Place 1 drop into the right eye daily.   sertraline 25 MG tablet Commonly known as: ZOLOFT Take 25 mg by mouth daily.   sodium chloride 5 % ophthalmic solution Commonly known as: MURO 128 Place 1 drop into both eyes 4 (four) times daily.   Systane 0.4-0.3 % Soln Generic drug: Polyethyl Glycol-Propyl Glycol Place 1 drop into the right eye in the morning and at bedtime. Instill )1) drop into right eye       Review of Systems  Constitutional: Negative.   HENT: Negative.   Respiratory: Negative.   Cardiovascular: Positive for leg swelling.  Gastrointestinal: Negative.   Genitourinary: Negative.   Musculoskeletal: Negative.   Skin: Negative.   Neurological: Negative.   Psychiatric/Behavioral: Positive for agitation, behavioral problems, confusion and dysphoric mood. Negative for sleep disturbance. The patient is nervous/anxious.     Immunization History  Administered Date(s) Administered  . Influenza, High Dose Seasonal PF 08/25/2019  . Influenza-Unspecified 10/01/2014  . Moderna SARS-COVID-2 Vaccination 11/24/2019, 12/22/2019  . Pneumococcal Conjugate-13 10/21/2014  . Pneumococcal Polysaccharide-23 01/23/2018  . Td 04/18/2013  . Zoster 09/04/2012   Pertinent  Health Maintenance Due  Topic Date Due  . DEXA SCAN  Never done  . INFLUENZA VACCINE  06/22/2020  . PNA vac Low Risk Adult  Completed   No flowsheet data found. Functional Status Survey:    Vitals:   05/27/20 1413  BP: (!) 148/58  Pulse: 77  Resp: 18  Temp: 98.6 F (37 C)  SpO2: 93%  Weight: 156 lb 3.2 oz (70.9 kg)  Height: 5\' 6"  (1.676 m)   Body mass index is 25.21  kg/m. Physical Exam Vitals reviewed.  Constitutional:      Appearance: Normal appearance.  HENT:     Head: Normocephalic.     Nose: Nose normal.     Mouth/Throat:     Mouth: Mucous membranes are moist.     Pharynx: Oropharynx is clear.  Eyes:     Pupils: Pupils are equal, round, and reactive to light.  Cardiovascular:     Rate and Rhythm: Normal rate.     Pulses: Normal pulses.  Pulmonary:     Effort: Pulmonary effort is normal.     Breath sounds: Normal breath sounds.  Abdominal:     General: Abdomen is flat.     Palpations:  Abdomen is soft.  Musculoskeletal:     Cervical back: Neck supple.     Comments: Chronic Venous Changes Bilateral  Skin:    General: Skin is warm.  Neurological:     General: No focal deficit present.     Mental Status: She is alert.  Psychiatric:        Mood and Affect: Mood normal.        Thought Content: Thought content normal.     Labs reviewed: Recent Labs    12/08/19 0000 12/26/19 0000 04/24/20 0000  NA 141 142 142  K 4.0 4.3 4.2  CL 108 107 108  CO2 25* 30* 29*  BUN 21 16 13   CREATININE 1.1 1.0 1.0  CALCIUM  --  9.3 9.5   Recent Labs    12/08/19 0000 04/24/20 0000  AST 19 17  ALT 11 11  ALKPHOS 41 41   Recent Labs    12/08/19 0000 04/24/20 0000  WBC 8.0 4.6  HGB 12.2 11.3*  HCT 37 34*  PLT 184 186   Lab Results  Component Value Date   TSH 2.45 04/24/2020   No results found for: HGBA1C Lab Results  Component Value Date   CHOL 141 12/08/2019   HDL 49 12/08/2019   LDLCALC 78 12/08/2019   TRIG 59 12/08/2019    Significant Diagnostic Results in last 30 days:  No results found.  Assessment/Plan  Vascular dementia with behavior disturbance ([place]) Patient already on [place]. BuSpar has not been effective We will start her on Seroquel 12.5 mg twice daily Will slowly taper off BuSpar. Will consider Depakote if no improvement Depression with anxiety Also increase the Zoloft to 50 mg PAF (paroxysmal atrial  fibrillation) (HCC) Continue on Eliquis Rheumatoid arthritis, I On methotrexate LFTs doing good Age-related osteoporosis without current pathological fracture Continue on Prolia Hyperlipidemia, unspecified hyperlipidemia type On statin LDL 78   Family/ staff Communication:   Labs/tests ordered:   Total time spent in this patient care encounter was  30_  minutes; greater than 50% of the visit spent counseling patient and staff, reviewing records , Labs and coordinating care for problems addressed at this encounter.

## 2020-06-13 ENCOUNTER — Encounter: Payer: Self-pay | Admitting: Nurse Practitioner

## 2020-06-13 ENCOUNTER — Non-Acute Institutional Stay: Payer: Medicare Other | Admitting: Nurse Practitioner

## 2020-06-13 DIAGNOSIS — F0150 Vascular dementia without behavioral disturbance: Secondary | ICD-10-CM

## 2020-06-13 DIAGNOSIS — I480 Paroxysmal atrial fibrillation: Secondary | ICD-10-CM

## 2020-06-13 DIAGNOSIS — M25551 Pain in right hip: Secondary | ICD-10-CM | POA: Insufficient documentation

## 2020-06-13 DIAGNOSIS — W19XXXA Unspecified fall, initial encounter: Secondary | ICD-10-CM | POA: Diagnosis not present

## 2020-06-13 DIAGNOSIS — R269 Unspecified abnormalities of gait and mobility: Secondary | ICD-10-CM | POA: Diagnosis not present

## 2020-06-13 DIAGNOSIS — M810 Age-related osteoporosis without current pathological fracture: Secondary | ICD-10-CM

## 2020-06-13 DIAGNOSIS — F418 Other specified anxiety disorders: Secondary | ICD-10-CM

## 2020-06-13 DIAGNOSIS — M069 Rheumatoid arthritis, unspecified: Secondary | ICD-10-CM

## 2020-06-13 NOTE — Assessment & Plan Note (Addendum)
The patient was reported 06/12/20 fall when she was found sit in the hallway, stated feet getting tangled up and I fell. The patient was noted right hip region bruise, pain with turning to her right side and with palpation, but she is able to walk and raise R leg. Will Xray right hip to r/o fxs. Close supervision for safety

## 2020-06-13 NOTE — Assessment & Plan Note (Signed)
Lacking safety awareness, close supervision for safety.

## 2020-06-13 NOTE — Progress Notes (Addendum)
Location:   [place] Room Number: 196 Place of Service:  AL[facility] (13) Provider: Lennie Odor Edin Skarda NP  Virgie Dad, MD  Patient Care Team: Virgie Dad, MD as PCP - General (Internal Medicine) Pichardo-Geisinger, Mila Palmer, MD as Referring Physician Nestor Ramp, Effie Shy, MD as Referring Physician (Ophthalmology)  Extended Emergency Contact Information Primary Emergency Contact: Key, Tim Address: [address]          [place], [place] [address] [name] of [place] Phone: [phone] Relation: [name] Secondary Emergency Contact: Owens Shark Address: [address]          [place], [place] [address] [name] of [place] Phone: [phone] Mobile Phone: [phone] Relation: Other  Code Status: DNR Goals of care: Advanced Directive information Advanced Directives 07/22/2020  Does Patient Have a Medical Advance Directive? Yes  Type of Advance Directive Living will  Does patient want to make changes to medical advance directive? No - Patient declined  Copy of [place] in Chart? -  Would patient like information on creating a medical advance directive? -  Pre-existing out of facility DNR order (yellow form or pink MOST form) -     Chief Complaint  Patient presents with  . Acute Visit    Fall    HPI:  Pt is a 86 y.o. female seen today for an acute visit for the patient was reported 06/12/20 fall when she was found sit in the hallway, stated feet getting tangled up and I fell. The patient was noted right hip region bruise, pain with turning to her right side and with palpation, but she is able to walk and raise R leg.    Dementia, resides in [facility], takes Memantine 10mg  bid   Afib, takes Eliquis 2.5mg  bid  Depression/anxiety, takes Buspirone 5mg  qd, Sertraline 25mg  qd  RA, takes Methotrexate 15mg  wkly  OP, takes Prolia, Ca, Vit D   Past Medical History:  Diagnosis Date  . Acid reflux disease   . Arthritis     rheumatoid  . Atrial fibrillation ([place])   . Back pain   . Bursitis of hip, right   . Cancer ([place])    skin  . Chest pain   . Confusion   . Constipation   . Fuchs' corneal dystrophy   . GERD (gastroesophageal reflux disease)   . Hard of hearing   . Knee pain   . Memory loss   . Paranoia ([place])   . Shoulder pain   . Venous insufficiency    Past Surgical History:  Procedure Laterality Date  . CORNEAL TRANSPLANT  2012   Dr Maudie Mercury   . RECONSTRUCTION OF EYELID     eyelid cancer 10 years ago Dr Victorino Dike   . RECONSTRUCTION OF NOSE     nose cancer/ Dr Nyoka Cowden 10 years ago     Allergies  Allergen Reactions  . Novocain [Procaine] Other (See Comments)    Unknown reaction  . Sulfamethoxazole-Trimethoprim Other (See Comments)    Unknown reaction  . Tape Other (See Comments)    Adhesive tape - unknown reaction  . Codeine Other (See Comments)    Unknown reaction  . Neosporin [Neomycin-Bacitracin Zn-Polymyx] Other (See Comments)    Unknown reaction    Allergies as of 06/13/2020      Reactions   Codeine    Neosporin [neomycin-bacitracin Zn-polymyx]    Novocain [procaine]    Sulfamethoxazole-trimethoprim    Tape    Adhesive tape  Medication List       Accurate as of June 13, 2020 11:59 PM. If you have any questions, ask your nurse or doctor.        STOP taking these medications   NON FORMULARY Stopped by: Shams Fill X Chrsitopher Wik, NP     TAKE these medications   busPIRone 5 MG tablet Commonly known as: BUSPAR Take 5 mg by mouth daily.   Calcium 1200 1200-1000 MG-UNIT Chew Chew 1 tablet by mouth daily.   denosumab 60 MG/ML Sosy injection Commonly known as: PROLIA Inject 60 mg into the skin every 6 (six) months. On the 22nd of reach 6th month   Eliquis 2.5 MG Tabs tablet Generic drug: apixaban Take 2.5 mg by mouth 2 (two) times daily.   folic acid 1 MG tablet Commonly known as: FOLVITE Take 1 mg by mouth daily.   memantine 10 MG tablet Commonly known as: NAMENDA Take  10 mg by mouth 2 (two) times daily.   methotrexate 2.5 MG tablet Commonly known as: RHEUMATREX Take 15 mg by mouth every Friday. Caution:Chemotherapy. Protect from light.   mineral oil-hydrophilic petrolatum ointment Apply 1 application topically 2 (two) times daily as needed for dry skin. Apply to forehead, legs, and right mid back   pravastatin 20 MG tablet Commonly known as: PRAVACHOL Take 20 mg by mouth at bedtime.   prednisoLONE acetate 1 % ophthalmic suspension Commonly known as: PRED FORTE Place 1 drop into the right eye daily.   sertraline 50 MG tablet Commonly known as: ZOLOFT Take 50 mg by mouth daily.   sodium chloride 5 % ophthalmic solution Commonly known as: MURO 128 Place 1 drop into both eyes 4 (four) times daily.   Systane 0.4-0.3 % Soln Generic drug: Polyethyl Glycol-Propyl Glycol Place 1 drop into both eyes in the morning and at bedtime.       Review of Systems  Constitutional: Negative for appetite change, fatigue and fever.  HENT: Positive for hearing loss. Negative for congestion and voice change.   Eyes: Negative for visual disturbance.  Respiratory: Negative for cough and shortness of breath.   Cardiovascular: Positive for leg swelling. Negative for chest pain and palpitations.  Gastrointestinal: Negative for abdominal pain, constipation, diarrhea, nausea and vomiting.  Genitourinary: Negative for difficulty urinating, dysuria and urgency.  Musculoskeletal: Positive for arthralgias and gait problem.       Acute onset right hip pain sustained from fall 06/12/20  Skin: Negative for color change.       Right hip bruise.   Neurological: Negative for speech difficulty, weakness, light-headedness and headaches.       Dementia  Psychiatric/Behavioral: Positive for confusion. Negative for behavioral problems and sleep disturbance. The patient is not nervous/anxious.     Immunization History  Administered Date(s) Administered  . Influenza, High Dose  Seasonal PF 08/25/2019  . Influenza-Unspecified 10/01/2014, 09/03/2020  . Moderna SARS-COVID-2 Vaccination 11/24/2019, 12/22/2019  . Pneumococcal Conjugate-13 10/21/2014  . Pneumococcal Polysaccharide-23 01/23/2018  . Td 04/18/2013  . Zoster 09/04/2012   Pertinent  Health Maintenance Due  Topic Date Due  . DEXA SCAN  Never done  . INFLUENZA VACCINE  Completed  . PNA vac Low Risk Adult  Completed   No flowsheet data found. Functional Status Survey:    Vitals:   06/13/20 1559  BP: (!) 130/80  Pulse: 85  Resp: 18  Temp: 98.5 F (36.9 C)  SpO2: 96%  Weight: 156 lb 3.2 oz (70.9 kg)  Height: 5\' 6"  (1.676 m)  Body mass index is 25.21 kg/m. Physical Exam Vitals and nursing note reviewed.  Constitutional:      Appearance: Normal appearance.  HENT:     Head: Normocephalic and atraumatic.     Mouth/Throat:     Mouth: Mucous membranes are moist.  Eyes:     Extraocular Movements: Extraocular movements intact.     Conjunctiva/sclera: Conjunctivae normal.     Pupils: Pupils are equal, round, and reactive to light.  Cardiovascular:     Rate and Rhythm: Normal rate and regular rhythm.  Pulmonary:     Effort: Pulmonary effort is normal.     Breath sounds: No rales.  Abdominal:     General: Bowel sounds are normal. There is distension.     Palpations: Abdomen is soft.     Tenderness: There is no abdominal tenderness. There is no right CVA tenderness, left CVA tenderness, guarding or rebound.     Hernia: A hernia is present.     Comments: Right inguinal hernia, reducible, a tennis ball sized   Musculoskeletal:     Cervical back: Normal range of motion and neck supple.     Right lower leg: Edema present.     Left lower leg: Edema present.     Comments: Trace edema BLE. Right hip pain when she turns to her right side while in bed, also palpated during my examination. Able to raise her right leg, staff reported the patient was able to ambulate with walker today.   Skin:     General: Skin is warm and dry.     Findings: Bruising present.     Comments: Right temple/forehead healed removed skin lesion. A palm sized bruise on the right hip region.   Neurological:     General: No focal deficit present.     Mental Status: She is alert. Mental status is at baseline.     Motor: No weakness.     Coordination: Coordination normal.     Gait: Gait abnormal.     Comments: Oriented to person, place.   Psychiatric:        Mood and Affect: Mood normal.        Behavior: Behavior normal.     Comments: Pleasantly confused.      Labs reviewed: Recent Labs    07/19/20 1900 07/20/20 0104 07/21/20 0342 07/31/20 0000  NA 139  --  140 139  K 3.9  --  4.1 4.2  CL 103  --  108 106  CO2 25  --  25 25*  GLUCOSE 127*  --  102*  --   BUN 17  --  23 19  CREATININE 0.99  --  1.07* 0.9  CALCIUM 10.6*  --  9.2 8.7  MG  --  2.2  --   --    Recent Labs    12/08/19 0000 04/24/20 0000 07/31/20 0000  AST 19 17 19   ALT 11 11 14   ALKPHOS 41 41 81  ALBUMIN  --   --  3.7   Recent Labs    07/19/20 1900 07/21/20 0342 07/31/20 0000  WBC 11.5* 8.0 8.7  NEUTROABS 9.4*  --  5,672  HGB 12.5 11.2* 11.6*  HCT 39.3 34.7* 35*  MCV 94.9 97.2  --   PLT 176 153 281   Lab Results  Component Value Date   TSH 2.45 04/24/2020   No results found for: HGBA1C Lab Results  Component Value Date   CHOL 141 12/08/2019   HDL 49 12/08/2019  LDLCALC 78 12/08/2019   TRIG 59 12/08/2019    Significant Diagnostic Results in last 30 days:  No results found.  Assessment/Plan: Fall The patient was reported 06/12/20 fall when she was found sit in the hallway, stated feet getting tangled up and I fell. The patient was noted right hip region bruise, pain with turning to her right side and with palpation, but she is able to walk and raise R leg. Will Xray right hip to r/o fxs. Close supervision for safety  Vascular dementia [facility]) Lacking safety awareness, close supervision for safety.    Gait abnormality Continue to ambulate with walker.   Right hip pain Sustained from fall 06/12/20, a palm sized bruise in the right hip region, pain with turning to her right side while in bed and with palpation. She is able to raise her right leg and walking.   PAF (paroxysmal atrial fibrillation) (HCC) Heart rate is in control, continue Eliquis.   Osteoporosis Treated with Prolia, Vit D, Ca  Rheumatoid arthritis ([place]) Stable, continue Methotrexate.   Depression with anxiety Her mood is stable, continue Buspirone, Sertraline.     Family/ staff Communication: plan of care reviewed with the patient and charge nurse.   Labs/tests ordered:  X-ray right hip 3 views  Time spend 40 minutes.

## 2020-06-13 NOTE — Assessment & Plan Note (Signed)
Treated with Prolia, Vit D, Ca

## 2020-06-13 NOTE — Assessment & Plan Note (Signed)
Continue to ambulate with walker.  

## 2020-06-13 NOTE — Assessment & Plan Note (Signed)
Heart rate is in control, continue Eliquis.  ?

## 2020-06-13 NOTE — Assessment & Plan Note (Signed)
Stable, continue Methotrexate.  

## 2020-06-13 NOTE — Assessment & Plan Note (Signed)
Her mood is stable, continue Buspirone, Sertraline.

## 2020-06-13 NOTE — Assessment & Plan Note (Signed)
Sustained from fall 06/12/20, a palm sized bruise in the right hip region, pain with turning to her right side while in bed and with palpation. She is able to raise her right leg and walking.

## 2020-07-19 ENCOUNTER — Other Ambulatory Visit: Payer: Self-pay

## 2020-07-19 ENCOUNTER — Emergency Department (HOSPITAL_COMMUNITY): Payer: Medicare Other

## 2020-07-19 ENCOUNTER — Inpatient Hospital Stay (HOSPITAL_COMMUNITY)
Admission: EM | Admit: 2020-07-19 | Discharge: 2020-07-24 | DRG: 552 | Disposition: A | Discharge status: Skilled Nursing Facility | Payer: Medicare Other | Source: Skilled Nursing Facility | Attending: Internal Medicine | Admitting: Internal Medicine

## 2020-07-19 ENCOUNTER — Encounter (HOSPITAL_COMMUNITY): Payer: Self-pay

## 2020-07-19 DIAGNOSIS — W1830XA Fall on same level, unspecified, initial encounter: Secondary | ICD-10-CM | POA: Diagnosis present

## 2020-07-19 DIAGNOSIS — F418 Other specified anxiety disorders: Secondary | ICD-10-CM | POA: Diagnosis present

## 2020-07-19 DIAGNOSIS — Z888 Allergy status to other drugs, medicaments and biological substances status: Secondary | ICD-10-CM

## 2020-07-19 DIAGNOSIS — S32059A Unspecified fracture of fifth lumbar vertebra, initial encounter for closed fracture: Secondary | ICD-10-CM | POA: Diagnosis not present

## 2020-07-19 DIAGNOSIS — M0689 Other specified rheumatoid arthritis, multiple sites: Secondary | ICD-10-CM | POA: Diagnosis present

## 2020-07-19 DIAGNOSIS — Z8249 Family history of ischemic heart disease and other diseases of the circulatory system: Secondary | ICD-10-CM

## 2020-07-19 DIAGNOSIS — L03115 Cellulitis of right lower limb: Secondary | ICD-10-CM | POA: Diagnosis present

## 2020-07-19 DIAGNOSIS — K219 Gastro-esophageal reflux disease without esophagitis: Secondary | ICD-10-CM | POA: Diagnosis present

## 2020-07-19 DIAGNOSIS — L03116 Cellulitis of left lower limb: Secondary | ICD-10-CM | POA: Diagnosis present

## 2020-07-19 DIAGNOSIS — Z947 Corneal transplant status: Secondary | ICD-10-CM

## 2020-07-19 DIAGNOSIS — Z20822 Contact with and (suspected) exposure to covid-19: Secondary | ICD-10-CM | POA: Diagnosis present

## 2020-07-19 DIAGNOSIS — F419 Anxiety disorder, unspecified: Secondary | ICD-10-CM | POA: Diagnosis present

## 2020-07-19 DIAGNOSIS — E785 Hyperlipidemia, unspecified: Secondary | ICD-10-CM | POA: Diagnosis present

## 2020-07-19 DIAGNOSIS — M810 Age-related osteoporosis without current pathological fracture: Secondary | ICD-10-CM | POA: Diagnosis present

## 2020-07-19 DIAGNOSIS — M069 Rheumatoid arthritis, unspecified: Secondary | ICD-10-CM | POA: Diagnosis present

## 2020-07-19 DIAGNOSIS — R52 Pain, unspecified: Secondary | ICD-10-CM | POA: Diagnosis not present

## 2020-07-19 DIAGNOSIS — Z820 Family history of epilepsy and other diseases of the nervous system: Secondary | ICD-10-CM

## 2020-07-19 DIAGNOSIS — Z7901 Long term (current) use of anticoagulants: Secondary | ICD-10-CM

## 2020-07-19 DIAGNOSIS — I700 Atherosclerosis of aorta: Secondary | ICD-10-CM | POA: Diagnosis present

## 2020-07-19 DIAGNOSIS — Z885 Allergy status to narcotic agent status: Secondary | ICD-10-CM

## 2020-07-19 DIAGNOSIS — F0150 Vascular dementia without behavioral disturbance: Secondary | ICD-10-CM | POA: Diagnosis present

## 2020-07-19 DIAGNOSIS — H18519 Endothelial corneal dystrophy, unspecified eye: Secondary | ICD-10-CM | POA: Diagnosis present

## 2020-07-19 DIAGNOSIS — Z79899 Other long term (current) drug therapy: Secondary | ICD-10-CM

## 2020-07-19 DIAGNOSIS — R296 Repeated falls: Secondary | ICD-10-CM | POA: Diagnosis present

## 2020-07-19 DIAGNOSIS — W19XXXA Unspecified fall, initial encounter: Secondary | ICD-10-CM

## 2020-07-19 DIAGNOSIS — F0390 Unspecified dementia without behavioral disturbance: Secondary | ICD-10-CM | POA: Diagnosis present

## 2020-07-19 DIAGNOSIS — I480 Paroxysmal atrial fibrillation: Secondary | ICD-10-CM | POA: Diagnosis present

## 2020-07-19 DIAGNOSIS — Z882 Allergy status to sulfonamides status: Secondary | ICD-10-CM

## 2020-07-19 DIAGNOSIS — Y92129 Unspecified place in nursing home as the place of occurrence of the external cause: Secondary | ICD-10-CM

## 2020-07-19 DIAGNOSIS — F329 Major depressive disorder, single episode, unspecified: Secondary | ICD-10-CM | POA: Diagnosis present

## 2020-07-19 DIAGNOSIS — M545 Low back pain, unspecified: Secondary | ICD-10-CM

## 2020-07-19 DIAGNOSIS — H9190 Unspecified hearing loss, unspecified ear: Secondary | ICD-10-CM | POA: Diagnosis present

## 2020-07-19 LAB — CBC WITH DIFFERENTIAL/PLATELET
Abs Immature Granulocytes: 0.06 10*3/uL (ref 0.00–0.07)
Basophils Absolute: 0 10*3/uL (ref 0.0–0.1)
Basophils Relative: 0 %
Eosinophils Absolute: 0 10*3/uL (ref 0.0–0.5)
Eosinophils Relative: 0 %
HCT: 39.3 % (ref 36.0–46.0)
Hemoglobin: 12.5 g/dL (ref 12.0–15.0)
Immature Granulocytes: 1 %
Lymphocytes Relative: 9 %
Lymphs Abs: 1.1 10*3/uL (ref 0.7–4.0)
MCH: 30.2 pg (ref 26.0–34.0)
MCHC: 31.8 g/dL (ref 30.0–36.0)
MCV: 94.9 fL (ref 80.0–100.0)
Monocytes Absolute: 1 10*3/uL (ref 0.1–1.0)
Monocytes Relative: 9 %
Neutro Abs: 9.4 10*3/uL — ABNORMAL HIGH (ref 1.7–7.7)
Neutrophils Relative %: 81 %
Platelets: 176 10*3/uL (ref 150–400)
RBC: 4.14 MIL/uL (ref 3.87–5.11)
RDW: 14.3 % (ref 11.5–15.5)
WBC: 11.5 10*3/uL — ABNORMAL HIGH (ref 4.0–10.5)
nRBC: 0 % (ref 0.0–0.2)

## 2020-07-19 LAB — BASIC METABOLIC PANEL
Anion gap: 11 (ref 5–15)
BUN: 17 mg/dL (ref 8–23)
CO2: 25 mmol/L (ref 22–32)
Calcium: 10.6 mg/dL — ABNORMAL HIGH (ref 8.9–10.3)
Chloride: 103 mmol/L (ref 98–111)
Creatinine, Ser: 0.99 mg/dL (ref 0.44–1.00)
GFR calc Af Amer: 56 mL/min — ABNORMAL LOW (ref >60)
GFR calc non Af Amer: 48 mL/min — ABNORMAL LOW (ref >60)
Glucose, Bld: 127 mg/dL — ABNORMAL HIGH (ref 70–99)
Potassium: 3.9 mmol/L (ref 3.5–5.1)
Sodium: 139 mmol/L (ref 135–145)

## 2020-07-19 LAB — SARS CORONAVIRUS 2 BY RT PCR (HOSPITAL ORDER, PERFORMED IN ~~LOC~~ HOSPITAL LAB): SARS Coronavirus 2: NEGATIVE

## 2020-07-19 MED ORDER — SODIUM CHLORIDE 0.9% FLUSH
3.0000 mL | Freq: Two times a day (BID) | INTRAVENOUS | Status: DC
  Administered 2020-07-19 – 2020-07-23 (×8): 3 mL via INTRAVENOUS

## 2020-07-19 MED ORDER — SODIUM CHLORIDE 0.9 % IV SOLN
1.0000 g | INTRAVENOUS | Status: DC
  Administered 2020-07-19: 1 g via INTRAVENOUS
  Filled 2020-07-19: qty 10

## 2020-07-19 MED ORDER — SODIUM CHLORIDE 0.9 % IV SOLN: 250.0000 mL | INTRAVENOUS | Status: DC | PRN

## 2020-07-19 MED ORDER — QUETIAPINE FUMARATE 25 MG PO TABS
12.5000 mg | ORAL_TABLET | Freq: Every day | ORAL | Status: DC
  Administered 2020-07-19 – 2020-07-23 (×5): 12.5 mg via ORAL
  Filled 2020-07-19 (×5): qty 1

## 2020-07-19 MED ORDER — HYDROCODONE-ACETAMINOPHEN 5-325 MG PO TABS
1.0000 | ORAL_TABLET | Freq: Once | ORAL | Status: AC
  Administered 2020-07-19: 1 via ORAL
  Filled 2020-07-19: qty 1

## 2020-07-19 MED ORDER — METHOTREXATE 2.5 MG PO TABS: 15.0000 mg | ORAL_TABLET | ORAL | Status: DC

## 2020-07-19 MED ORDER — SODIUM CHLORIDE 0.9% FLUSH: 3.0000 mL | INTRAVENOUS | Status: DC | PRN

## 2020-07-19 MED ORDER — ACETAMINOPHEN 325 MG PO TABS: 650.0000 mg | ORAL_TABLET | ORAL | Status: DC | PRN

## 2020-07-19 MED ORDER — SODIUM CHLORIDE 0.9 % IV SOLN: INTRAVENOUS | Status: DC

## 2020-07-19 MED ORDER — CEPHALEXIN 250 MG PO CAPS: 500.0000 mg | ORAL_CAPSULE | Freq: Three times a day (TID) | ORAL | Status: DC

## 2020-07-19 MED ORDER — MEMANTINE HCL 10 MG PO TABS
10.0000 mg | ORAL_TABLET | Freq: Two times a day (BID) | ORAL | Status: DC
  Administered 2020-07-19 – 2020-07-24 (×10): 10 mg via ORAL
  Filled 2020-07-19 (×10): qty 1

## 2020-07-19 MED ORDER — BUSPIRONE HCL 5 MG PO TABS
5.0000 mg | ORAL_TABLET | Freq: Every day | ORAL | Status: DC
  Administered 2020-07-20 – 2020-07-24 (×5): 5 mg via ORAL
  Filled 2020-07-19 (×5): qty 1

## 2020-07-19 MED ORDER — APIXABAN 2.5 MG PO TABS
2.5000 mg | ORAL_TABLET | Freq: Two times a day (BID) | ORAL | Status: DC
  Administered 2020-07-19 – 2020-07-24 (×10): 2.5 mg via ORAL
  Filled 2020-07-19 (×11): qty 1

## 2020-07-19 MED ORDER — DOXYCYCLINE HYCLATE 100 MG PO TABS
100.0000 mg | ORAL_TABLET | Freq: Two times a day (BID) | ORAL | Status: DC
  Administered 2020-07-19 – 2020-07-20 (×2): 100 mg via ORAL
  Filled 2020-07-19 (×2): qty 1

## 2020-07-19 MED ORDER — MORPHINE SULFATE (PF) 2 MG/ML IV SOLN
1.0000 mg | INTRAVENOUS | Status: DC | PRN
  Administered 2020-07-22: 1 mg via INTRAVENOUS
  Filled 2020-07-19: qty 1

## 2020-07-19 MED ORDER — LIDOCAINE 5 % EX PTCH
1.0000 | MEDICATED_PATCH | CUTANEOUS | Status: DC
  Administered 2020-07-19 – 2020-07-23 (×5): 1 via TRANSDERMAL
  Filled 2020-07-19 (×7): qty 1

## 2020-07-19 MED ORDER — SERTRALINE HCL 50 MG PO TABS
50.0000 mg | ORAL_TABLET | Freq: Every day | ORAL | Status: DC
  Administered 2020-07-20 – 2020-07-24 (×5): 50 mg via ORAL
  Filled 2020-07-19 (×5): qty 1

## 2020-07-19 MED ORDER — FOLIC ACID 1 MG PO TABS
1.0000 mg | ORAL_TABLET | Freq: Every day | ORAL | Status: DC
  Administered 2020-07-20 – 2020-07-24 (×5): 1 mg via ORAL
  Filled 2020-07-19 (×5): qty 1

## 2020-07-19 MED ORDER — ONDANSETRON HCL 4 MG/2ML IJ SOLN: 4.0000 mg | Freq: Four times a day (QID) | INTRAMUSCULAR | Status: DC | PRN

## 2020-07-19 NOTE — ED Notes (Signed)
Attempted report X1

## 2020-07-19 NOTE — ED Triage Notes (Signed)
Per [facility] pt from [facility], pt with an unwitnessed fall 0600 this am. Normally ambulatory with a walker, staff assisted pt to a chair after fall, pt unable to stand for xr d/t new onset low back pain. pt unable to sit up in bed. No obvious signs of injury, c-collar in place, pt does take eliquis. Pt fully vaccinated, negative antibody 8/24   BP 162/98 HR 76 RR 16 GCS 13 88% RA, 100% 6L [place]

## 2020-07-19 NOTE — ED Provider Notes (Signed)
[facility]ERGENCY DEPARTMENT Provider Note   CSN: [id number] Arrival date & time: 07/19/20  1446     History Chief Complaint  Patient presents with  . Fall    Adrienne [name] is a 86 y.o. female.  86 year old female with prior medical history as detailed below presents for evaluation following reported fall.  Patient had a fall around 6 AM this morning.  Patient was complaining of low back pain after the fall.  She normally ambulates with assistance from a walker.  She complains now of low back discomfort.  She denies other specific injury.  She denies known head injury.  She is on Eliquis.  She is at her normal mental status baseline.  Patient's family at bedside.  They confirm that she is a normal mental status baseline.  Family is concerned about possible low back injury secondary to the mechanical fall this morning.  The history is provided by the patient, medical records and a friend.  Fall This is a recurrent problem. The current episode started 6 to 12 hours ago. The problem occurs every several days. The problem has not changed since onset.Pertinent negatives include no chest pain and no abdominal pain. The symptoms are aggravated by bending and standing. The symptoms are relieved by rest.       Past Medical History:  Diagnosis Date  . Acid reflux disease   . Arthritis    rheumatoid  . Atrial fibrillation ([place])   . Back pain   . Bursitis of hip, right   . Cancer ([place])    skin  . Chest pain   . Confusion   . Constipation   . Fuchs' corneal dystrophy   . GERD (gastroesophageal reflux disease)   . Hard of hearing   . Knee pain   . Memory loss   . Paranoia ([place])   . Shoulder pain   . Venous insufficiency     Patient Active Problem List   Diagnosis Date Noted  . Gait abnormality 06/13/2020  . Right hip pain 06/13/2020  . Right inguinal hernia 02/20/2020  . Fall 10/16/2019  . Depression with anxiety 07/27/2019  . PAF (paroxysmal  atrial fibrillation) ([place]) 06/03/2019  . Rheumatoid arthritis ([place]) 04/24/2019  . Vascular dementia ([place]) 04/24/2019  . Hyperlipidemia 04/24/2019  . Osteoporosis 04/24/2019    Past Surgical History:  Procedure Laterality Date  . CORNEAL TRANSPLANT  2012   Dr Maudie Mercury   . RECONSTRUCTION OF EYELID     eyelid cancer 10 years ago Dr Victorino Dike   . RECONSTRUCTION OF NOSE     nose cancer/ Dr Nyoka Cowden 10 years ago      OB History   No obstetric history on file.     Family History  Problem Relation Age of Onset  . Heart disease Father   . Alzheimer's disease Sister   . Parkinson's disease Sister     Social History   Tobacco Use  . Smoking status: Never Smoker  . Smokeless tobacco: Never Used  Vaping Use  . Vaping Use: Never used  Substance Use Topics  . Alcohol use: Not Currently  . Drug use: Not Currently    Home Medications Prior to Admission medications   Medication Sig Start Date End Date Taking? Authorizing Provider  apixaban (ELIQUIS) 2.5 MG TABS tablet Take 2.5 mg by mouth 2 (two) times daily.    [provider]  BUSPIRONE HCL PO Take 5 mg by mouth. Once a day     [provider]  Calcium Carbonate-Vit D-Min (CALCIUM 1200) 1200-1000 MG-UNIT CHEW Chew 1 tablet by mouth daily.    [provider]  denosumab (PROLIA) 60 MG/ML SOSY injection Inject 60 mg into the skin every 6 (six) months. Once A Day on the 22nd of Every 6th Month    [provider]  folic acid (FOLVITE) 1 MG tablet Take 1 mg by mouth daily.    [provider]  memantine (NAMENDA) 10 MG tablet Take 10 mg by mouth 2 (two) times daily.     [provider]  methotrexate (RHEUMATREX) 2.5 MG tablet Take 15 mg by mouth once a week. Caution:Chemotherapy. Protect from light. Once a day on Friday take 6 tablets once weekly    [provider]  mineral oil-hydrophilic petrolatum (AQUAPHOR) ointment Apply 1 application topically 2 (two) times daily as needed for  dry skin. Administer BID PRN to forehead, legs, and right mid back 07/04/19   [provider]  Polyethyl Glycol-Propyl Glycol (SYSTANE) 0.4-0.3 % SOLN Place 1 drop into the right eye in the morning and at bedtime. Instill )1) drop into right eye  01/30/19   [provider]  pravastatin (PRAVACHOL) 20 MG tablet Take 20 mg by mouth daily.    [provider]  prednisoLONE acetate (PRED FORTE) 1 % ophthalmic suspension Place 1 drop into the right eye daily. 05/15/19   [provider]  sertraline (ZOLOFT) 25 MG tablet Take 25 mg by mouth daily.    [provider]  sodium chloride (MURO 128) 5 % ophthalmic solution Place 1 drop into both eyes 4 (four) times daily.    [provider]    Allergies    Codeine, Neosporin [neomycin-bacitracin zn-polymyx], Novocain [procaine], Sulfamethoxazole-trimethoprim, and Tape  Review of Systems   Review of Systems  Cardiovascular: Negative for chest pain.  Gastrointestinal: Negative for abdominal pain.  All other systems reviewed and are negative.   Physical Exam Updated Vital Signs BP (!) 179/79   Pulse 83   Temp 98 F (36.7 C) (Axillary)   Resp 15   SpO2 90%   Physical Exam Vitals and nursing note reviewed.  Constitutional:      General: She is not in acute distress.    Appearance: Normal appearance. She is well-developed.  HENT:     Head: Normocephalic and atraumatic.  Eyes:     Conjunctiva/sclera: Conjunctivae normal.     Pupils: Pupils are equal, round, and reactive to light.  Cardiovascular:     Rate and Rhythm: Normal rate and regular rhythm.     Heart sounds: Normal heart sounds.  Pulmonary:     Effort: Pulmonary effort is normal. No respiratory distress.     Breath sounds: Normal breath sounds.  Abdominal:     General: There is no distension.     Palpations: Abdomen is soft.     Tenderness: There is no abdominal tenderness.  Musculoskeletal:        General: No deformity. Normal  range of motion.     Cervical back: Normal range of motion and neck supple.  Skin:    General: Skin is warm and dry.  Neurological:     Mental Status: She is alert and oriented to person, place, and time.     ED Results / Procedures / Treatments   Labs (all labs ordered are listed, but only abnormal results are displayed) Labs Reviewed - No data to display  EKG None  Radiology DG Chest 1 View  Result  Date: 07/19/2020 CLINICAL DATA:  Status post fall. EXAM: CHEST  1 VIEW COMPARISON:  November 26, 2018 FINDINGS: Mild, chronic appearing diffusely increased lung markings are seen. There is no evidence of acute infiltrate, pleural effusion or pneumothorax. The heart size and mediastinal contours are within normal limits. There is marked severity calcification of the thoracic aorta. There is a chronic fracture deformity of the sixth right rib. An additional chronic deformity of the mid left clavicle is seen. Degenerative changes are noted throughout the thoracic spine. IMPRESSION: Chronic appearing interstitial lung disease without evidence of acute or active cardiopulmonary disease. Electronically Signed   By: Virgina Norfolk M.D.   On: 07/19/2020 16:28   DG Lumbar Spine 2-3 Views  Result Date: 07/19/2020 CLINICAL DATA:  Unwitnessed fall.  Low back pain. Unable to stand. EXAM: LUMBAR SPINE - 2-3 VIEW COMPARISON:  None. FINDINGS: No evidence of acute fracture. Vertebral body heights are preserved. There is 5 mm anterolisthesis of L4 on L5 that is likely degenerative. Disc space narrowing and endplate spurring most prominent at L4-L5 and L5-S1. bones are diffusely under mineralized. Multilevel facet hypertrophy. The sacroiliac joints are congruent with mild degenerative change. Aortic atherosclerosis. IMPRESSION: 1. Multilevel degenerative disc disease and facet hypertrophy in the lumbar spine. No evidence of acute fracture. 2. Grade 1 anterolisthesis of L4 on L5, likely degenerative. Electronically  Signed   By: Keith Rake M.D.   On: 07/19/2020 16:31   DG Pelvis 1-2 Views  Result Date: 07/19/2020 CLINICAL DATA:  Unwitnessed fall.  Low back pain.  Unable to stand. EXAM: PELVIS - 1-2 VIEW COMPARISON:  None. FINDINGS: The cortical margins of the bony pelvis are intact. No fracture. Pubic symphysis and sacroiliac joints are congruent. Portions of the pubic rami are partially obscured by overlying stool/bowel gas. Both femoral heads are well-seated in the respective acetabula. Moderate bilateral hip osteoarthritis with joint space narrowing. IMPRESSION: 1. No pelvic fracture. 2. Moderate bilateral hip osteoarthritis. Electronically Signed   By: Keith Rake M.D.   On: 07/19/2020 16:29   CT Head Wo Contrast  Result Date: 07/19/2020 CLINICAL DATA:  Unwitnessed fall.  On Eliquis. EXAM: CT HEAD WITHOUT CONTRAST CT CERVICAL SPINE WITHOUT CONTRAST TECHNIQUE: Multidetector CT imaging of the head and cervical spine was performed following the standard protocol without intravenous contrast. Multiplanar CT image reconstructions of the cervical spine were also generated. COMPARISON:  MRI brain dated May 28, 2017. CT head and cervical spine dated March 11, 2016. FINDINGS: CT HEAD FINDINGS Brain: No evidence of acute infarction, hemorrhage, hydrocephalus, extra-axial collection or mass lesion/mass effect. Stable atrophy and chronic microvascular ischemic changes. Slightly asymmetric hyperdensity near the left hippocampus and temporal horn is favored to represent choroid plexus. Vascular: Atherosclerotic vascular calcification of the carotid siphons. No hyperdense vessel. Skull: Normal. Negative for fracture or focal lesion. Sinuses/Orbits: Small air-fluid level in the right sphenoid sinus. Frothy secretions a left posterior ethmoid air cell. The orbits are unremarkable. Other: None. CT CERVICAL SPINE FINDINGS Alignment: No traumatic malalignment. Unchanged trace anterolisthesis at C4-C5 and C7-T1. Skull base  and vertebrae: No acute fracture. No primary bone lesion or focal pathologic process. Soft tissues and spinal canal: No prevertebral fluid or swelling. No visible canal hematoma. Disc levels: Multilevel disc height loss, severe at C4-C5 and C5-C6. Moderate to severe facet uncovertebral hypertrophy throughout the cervical spine. Findings are similar to prior study Upper chest: Biapical pleuroparenchymal scarring. Other: Secretions in the trachea. IMPRESSION: 1. No acute intracranial abnormality. Stable atrophy and chronic microvascular  ischemic changes. 2. No acute cervical spine fracture or traumatic malalignment. 3. Secretions in the trachea. Correlate for aspiration. Electronically Signed   By: Titus Dubin M.D.   On: 07/19/2020 16:17   CT Cervical Spine Wo Contrast  Result Date: 07/19/2020 CLINICAL DATA:  Unwitnessed fall.  On Eliquis. EXAM: CT HEAD WITHOUT CONTRAST CT CERVICAL SPINE WITHOUT CONTRAST TECHNIQUE: Multidetector CT imaging of the head and cervical spine was performed following the standard protocol without intravenous contrast. Multiplanar CT image reconstructions of the cervical spine were also generated. COMPARISON:  MRI brain dated May 28, 2017. CT head and cervical spine dated March 11, 2016. FINDINGS: CT HEAD FINDINGS Brain: No evidence of acute infarction, hemorrhage, hydrocephalus, extra-axial collection or mass lesion/mass effect. Stable atrophy and chronic microvascular ischemic changes. Slightly asymmetric hyperdensity near the left hippocampus and temporal horn is favored to represent choroid plexus. Vascular: Atherosclerotic vascular calcification of the carotid siphons. No hyperdense vessel. Skull: Normal. Negative for fracture or focal lesion. Sinuses/Orbits: Small air-fluid level in the right sphenoid sinus. Frothy secretions a left posterior ethmoid air cell. The orbits are unremarkable. Other: None. CT CERVICAL SPINE FINDINGS Alignment: No traumatic malalignment. Unchanged  trace anterolisthesis at C4-C5 and C7-T1. Skull base and vertebrae: No acute fracture. No primary bone lesion or focal pathologic process. Soft tissues and spinal canal: No prevertebral fluid or swelling. No visible canal hematoma. Disc levels: Multilevel disc height loss, severe at C4-C5 and C5-C6. Moderate to severe facet uncovertebral hypertrophy throughout the cervical spine. Findings are similar to prior study Upper chest: Biapical pleuroparenchymal scarring. Other: Secretions in the trachea. IMPRESSION: 1. No acute intracranial abnormality. Stable atrophy and chronic microvascular ischemic changes. 2. No acute cervical spine fracture or traumatic malalignment. 3. Secretions in the trachea. Correlate for aspiration. Electronically Signed   By: Titus Dubin M.D.   On: 07/19/2020 16:17   CT Lumbar Spine Wo Contrast  Result Date: 07/19/2020 CLINICAL DATA:  Fall.  Back pain. EXAM: CT LUMBAR SPINE WITHOUT CONTRAST TECHNIQUE: Multidetector CT imaging of the lumbar spine was performed without intravenous contrast administration. Multiplanar CT image reconstructions were also generated. COMPARISON:  Lumbar radiographs 07/19/2020 FINDINGS: Segmentation: Normal Alignment: Mild anterolisthesis L2-3, L3-4, L4-5 Vertebrae: Nondisplaced acute fractures of the L5 transverse process bilaterally. No vertebral body fracture. Paraspinal and other soft tissues: Atherosclerotic calcification aorta and iliac arteries without aneurysm. No paraspinous mass or adenopathy. Right renal cyst. Disc levels: T11-12: Moderate disc degeneration and spurring. Moderate foraminal narrowing bilaterally due to spurring T12-L1: Mild disc degeneration.  Negative for stenosis. L1-2: Disc degeneration and mild spurring. No significant stenosis. Mild facet degeneration. L2-3: Disc degeneration with diffuse endplate spurring. Bilateral facet degeneration. Mild spinal stenosis. Moderate subarticular stenosis on the left and mild subarticular  stenosis on the right. L3-4: Disc degeneration with disc bulging. Bilateral facet hypertrophy. Moderate subarticular stenosis on the right. Spinal canal adequate in size L4-5: Broad-based central disc protrusion with diffuse endplate spurring and bilateral facet degeneration. Moderate subarticular stenosis bilaterally L5-S1: Mild disc and mild facet degeneration. No significant stenosis. IMPRESSION: Nondisplaced fractures of the L5 transverse process bilaterally. These appear acute. No fracture of the vertebral body. Multilevel degenerative change throughout the lumbar spine as above. Electronically Signed   By: Franchot Gallo M.D.   On: 07/19/2020 20:12    Procedures Procedures (including critical care time)   Medications Ordered in ED Medications  HYDROcodone-acetaminophen (NORCO/VICODIN) 5-325 MG per tablet 1 tablet (1 tablet Oral Given 07/19/20 1702)    ED Course  I  have reviewed the triage vital signs and the nursing notes.  Pertinent labs & imaging results that were available during my care of the patient were reviewed by me and considered in my medical decision making (see chart for details).    MDM Rules/Calculators/A&P                          MDM  Screen complete  Adrienne [name] was evaluated in Emergency Department on 07/19/2020 for the symptoms described in the history of present illness. She was evaluated in the context of the global COVID-19 pandemic, which necessitated consideration that the patient might be at risk for infection with the SARS-CoV-2 virus that causes COVID-19. Institutional protocols and algorithms that pertain to the evaluation of patients at risk for COVID-19 are in a state of rapid change based on information released by regulatory bodies including the CDC and federal and state organizations. These policies and algorithms were followed during the patient's care in the ED.   Patient is presenting for evaluation following reported fall.  Patient planes  of low back pain after fall.  Imaging studies reveal L5 transverse process fracture.  Patient would likely benefit from admission for pain control and PT.  Hospitalist service is aware of case and will evaluate for admission.      Final Clinical Impression(s) / ED Diagnoses Final diagnoses:  Acute low back pain without sciatica, unspecified back pain laterality  Fall, initial encounter    Rx / DC Orders ED Discharge Orders    None       Valarie Merino, MD 07/19/20 2038

## 2020-07-19 NOTE — ED Notes (Signed)
Attempted to ambulate patient. Patient reports she is unable to secondary to her back pain. Patient refuses to even sit on edge of bed. EDP aware and is at bedside speaking with patient and son

## 2020-07-19 NOTE — ED Notes (Signed)
Placed on 2L [place]  

## 2020-07-19 NOTE — H&P (Signed)
PCP:   Virgie Dad, MD   Chief Complaint:  Fall   HPI: This is a 86 year old resident of an assisted nursing center.  She presents after she had a mechanical fall yesterday.  Since then she has been unable to ambulate.  She is also on Eliquis.  She was taken to the ER for evaluation.  Review of Systems:  Unable to obtain due to patient's dementia  Past Medical History: Past Medical History:  Diagnosis Date  . Acid reflux disease   . Arthritis    rheumatoid  . Atrial fibrillation ([place])   . Back pain   . Bursitis of hip, right   . Cancer ([place])    skin  . Chest pain   . Confusion   . Constipation   . Fuchs' corneal dystrophy   . GERD (gastroesophageal reflux disease)   . Hard of hearing   . Knee pain   . Memory loss   . Paranoia ([place])   . Shoulder pain   . Venous insufficiency    Past Surgical History:  Procedure Laterality Date  . CORNEAL TRANSPLANT  2012   Dr Maudie Mercury   . RECONSTRUCTION OF EYELID     eyelid cancer 10 years ago Dr Victorino Dike   . RECONSTRUCTION OF NOSE     nose cancer/ Dr Nyoka Cowden 10 years ago     Medications: Prior to Admission medications   Medication Sig Start Date End Date Taking? Authorizing Provider  apixaban (ELIQUIS) 2.5 MG TABS tablet Take 2.5 mg by mouth 2 (two) times daily.   Yes [provider]  busPIRone (BUSPAR) 5 MG tablet Take 5 mg by mouth daily.    Yes [provider]  Calcium Carbonate-Vit D-Min (CALCIUM 1200) 1200-1000 MG-UNIT CHEW Chew 1 tablet by mouth daily.   Yes [provider]  denosumab (PROLIA) 60 MG/ML SOSY injection Inject 60 mg into the skin every 6 (six) months. On the 22nd of reach 6th month   Yes [provider]  fluorouracil (EFUDEX) 5 % cream Apply 1 application topically See admin instructions. Apply peasized amount topically to right temple, nose and right upper lip twice daily for 3 weeks   Yes [provider]  folic acid (FOLVITE) 1 MG tablet Take 1 mg by mouth daily.    Yes [provider]  memantine (NAMENDA) 10 MG tablet Take 10 mg by mouth 2 (two) times daily.    Yes [provider]  methotrexate (RHEUMATREX) 2.5 MG tablet Take 15 mg by mouth every Friday. Caution:Chemotherapy. Protect from light.   Yes [provider]  mineral oil-hydrophilic petrolatum (AQUAPHOR) ointment Apply 1 application topically 2 (two) times daily as needed for dry skin. Apply to forehead, legs, and right mid back 07/04/19  Yes [provider]  Polyethyl Glycol-Propyl Glycol (SYSTANE) 0.4-0.3 % SOLN Place 1 drop into both eyes in the morning and at bedtime.  01/30/19  Yes [provider]  pravastatin (PRAVACHOL) 20 MG tablet Take 20 mg by mouth at bedtime.    Yes [provider]  prednisoLONE acetate (PRED FORTE) 1 % ophthalmic suspension Place 1 drop into the right eye daily. 05/15/19  Yes [provider]  QUEtiapine (SEROQUEL) 25 MG tablet Take 12.5 mg by mouth at bedtime.   Yes [provider]  sertraline (ZOLOFT) 50 MG tablet Take 50 mg by mouth daily.    Yes [provider]  sodium chloride (MURO 128) 5 % ophthalmic solution Place 1 drop into both eyes  4 (four) times daily.   Yes [provider]    Allergies:   Allergies  Allergen Reactions  . Codeine Other (See Comments)    Unknown reaction  . Neosporin [Neomycin-Bacitracin Zn-Polymyx] Other (See Comments)    Unknown reaction  . Novocain [Procaine] Other (See Comments)    Unknown reaction  . Sulfamethoxazole-Trimethoprim Other (See Comments)    Unknown reaction  . Tape Other (See Comments)    Adhesive tape - unknown reaction    Social History:  reports that she has never smoked. She has never used smokeless tobacco. She reports previous alcohol use. She reports previous drug use.  Family History: Family History  Problem Relation Age of Onset  . Heart disease Father   . Alzheimer's disease Sister   . Parkinson's disease Sister      Physical Exam: Vitals:   07/19/20 1459  BP: (!) 179/79  Pulse: 83  Resp: 15  Temp: 98 F (36.7 C)  TempSrc: Axillary  SpO2: 90%    General: Awake, demented patient, frail, no acute distress Eyes: PERRLA, pink conjunctiva, no scleral icterus ENT: Moist oral mucosa, neck supple, no thyromegaly Lungs: clear to ascultation, no wheeze, no crackles, no use of accessory muscles Cardiovascular: regular rate and rhythm, no regurgitation, no gallops, no murmurs. No carotid bruits, no JVD Abdomen: soft, positive BS, non-tender, non-distended, no organomegaly, not an acute abdomen GU: not examined Neuro: CN II - XII grossly intact, sensation intact Musculoskeletal: strength 5/5 all extremities, no clubbing, cyanosis or edema Skin: no rash, no subcutaneous crepitation, no decubitus, bilateral lower extremity warmth, no significant erythema but bruising on the shin of right lower extremity Psych: Pleasantly demented patient   Labs on Admission:  Recent Labs    07/19/20 1900  NA 139  K 3.9  CL 103  CO2 25  GLUCOSE 127*  BUN 17  CREATININE 0.99  CALCIUM 10.6*   No results for input(s): AST, ALT, ALKPHOS, BILITOT, PROT, ALBUMIN in the last 72 hours. No results for input(s): LIPASE, AMYLASE in the last 72 hours. Recent Labs    07/19/20 1900  WBC 11.5*  NEUTROABS 9.4*  HGB 12.5  HCT 39.3  MCV 94.9  PLT 176   No results for input(s): CKTOTAL, CKMB, CKMBINDEX, TROPONINI in the last 72 hours. Invalid input(s): POCBNP No results for input(s): DDIMER in the last 72 hours. No results for input(s): HGBA1C in the last 72 hours. No results for input(s): CHOL, HDL, LDLCALC, TRIG, CHOLHDL, LDLDIRECT in the last 72 hours. No results for input(s): TSH, T4TOTAL, T3FREE, THYROIDAB in the last 72 hours.  Invalid input(s): FREET3 No results for input(s): VITAMINB12, FOLATE, FERRITIN, TIBC, IRON, RETICCTPCT in the last 72 hours.  Micro Results: Recent Results (from the past 240 hour(s))   SARS Coronavirus 2 by RT PCR (hospital order, performed in [facility] hospital lab) Nasopharyngeal Nasopharyngeal Swab     Status: None   Collection Time: 07/19/20  6:46 PM   Specimen: Nasopharyngeal Swab  Result Value Ref Range Status   SARS Coronavirus 2 NEGATIVE NEGATIVE Final    Comment: (NOTE) SARS-CoV-2 target nucleic acids are NOT DETECTED.  The SARS-CoV-2 RNA is generally detectable in upper and lower respiratory specimens during the acute phase of infection. The lowest concentration of SARS-CoV-2 viral copies this assay can detect is 250 copies / mL. A negative result does not preclude SARS-CoV-2 infection and should not be used as the sole basis for treatment or other patient management decisions.  A negative result  may occur with improper specimen collection / handling, submission of specimen other than nasopharyngeal swab, presence of viral mutation(s) within the areas targeted by this assay, and inadequate number of viral copies (<250 copies / mL). A negative result must be combined with clinical observations, patient history, and epidemiological information.  Fact Sheet for Patients:   StrictlyIdeas.no  Fact Sheet for Healthcare Providers: BankingDealers.co.za  This test is not yet approved or  cleared by the [place] FDA and has been authorized for detection and/or diagnosis of SARS-CoV-2 by FDA under an Emergency Use Authorization (EUA).  This EUA will remain in effect (meaning this test can be used) for the duration of the COVID-19 declaration under Section 564(b)(1) of the Act, 21 U.S.C. section 360bbb-3(b)(1), unless the authorization is terminated or revoked sooner.  Performed at [facility], [place] [address], [place], [place] [address]      Radiological Exams on Admission: DG Chest 1 View  Result Date: 07/19/2020 CLINICAL DATA:  Status post fall. EXAM: CHEST  1 VIEW COMPARISON:  November 26, 2018  FINDINGS: Mild, chronic appearing diffusely increased lung markings are seen. There is no evidence of acute infiltrate, pleural effusion or pneumothorax. The heart size and mediastinal contours are within normal limits. There is marked severity calcification of the thoracic aorta. There is a chronic fracture deformity of the sixth right rib. An additional chronic deformity of the mid left clavicle is seen. Degenerative changes are noted throughout the thoracic spine. IMPRESSION: Chronic appearing interstitial lung disease without evidence of acute or active cardiopulmonary disease. Electronically Signed   By: Virgina Norfolk M.D.   On: 07/19/2020 16:28   DG Lumbar Spine 2-3 Views  Result Date: 07/19/2020 CLINICAL DATA:  Unwitnessed fall.  Low back pain. Unable to stand. EXAM: LUMBAR SPINE - 2-3 VIEW COMPARISON:  None. FINDINGS: No evidence of acute fracture. Vertebral body heights are preserved. There is 5 mm anterolisthesis of L4 on L5 that is likely degenerative. Disc space narrowing and endplate spurring most prominent at L4-L5 and L5-S1. bones are diffusely under mineralized. Multilevel facet hypertrophy. The sacroiliac joints are congruent with mild degenerative change. Aortic atherosclerosis. IMPRESSION: 1. Multilevel degenerative disc disease and facet hypertrophy in the lumbar spine. No evidence of acute fracture. 2. Grade 1 anterolisthesis of L4 on L5, likely degenerative. Electronically Signed   By: Keith Rake M.D.   On: 07/19/2020 16:31   DG Pelvis 1-2 Views  Result Date: 07/19/2020 CLINICAL DATA:  Unwitnessed fall.  Low back pain.  Unable to stand. EXAM: PELVIS - 1-2 VIEW COMPARISON:  None. FINDINGS: The cortical margins of the bony pelvis are intact. No fracture. Pubic symphysis and sacroiliac joints are congruent. Portions of the pubic rami are partially obscured by overlying stool/bowel gas. Both femoral heads are well-seated in the respective acetabula. Moderate bilateral hip  osteoarthritis with joint space narrowing. IMPRESSION: 1. No pelvic fracture. 2. Moderate bilateral hip osteoarthritis. Electronically Signed   By: Keith Rake M.D.   On: 07/19/2020 16:29   CT Head Wo Contrast  Result Date: 07/19/2020 CLINICAL DATA:  Unwitnessed fall.  On Eliquis. EXAM: CT HEAD WITHOUT CONTRAST CT CERVICAL SPINE WITHOUT CONTRAST TECHNIQUE: Multidetector CT imaging of the head and cervical spine was performed following the standard protocol without intravenous contrast. Multiplanar CT image reconstructions of the cervical spine were also generated. COMPARISON:  MRI brain dated May 28, 2017. CT head and cervical spine dated March 11, 2016. FINDINGS: CT HEAD FINDINGS Brain: No evidence of acute infarction,  hemorrhage, hydrocephalus, extra-axial collection or mass lesion/mass effect. Stable atrophy and chronic microvascular ischemic changes. Slightly asymmetric hyperdensity near the left hippocampus and temporal horn is favored to represent choroid plexus. Vascular: Atherosclerotic vascular calcification of the carotid siphons. No hyperdense vessel. Skull: Normal. Negative for fracture or focal lesion. Sinuses/Orbits: Small air-fluid level in the right sphenoid sinus. Frothy secretions a left posterior ethmoid air cell. The orbits are unremarkable. Other: None. CT CERVICAL SPINE FINDINGS Alignment: No traumatic malalignment. Unchanged trace anterolisthesis at C4-C5 and C7-T1. Skull base and vertebrae: No acute fracture. No primary bone lesion or focal pathologic process. Soft tissues and spinal canal: No prevertebral fluid or swelling. No visible canal hematoma. Disc levels: Multilevel disc height loss, severe at C4-C5 and C5-C6. Moderate to severe facet uncovertebral hypertrophy throughout the cervical spine. Findings are similar to prior study Upper chest: Biapical pleuroparenchymal scarring. Other: Secretions in the trachea. IMPRESSION: 1. No acute intracranial abnormality. Stable atrophy  and chronic microvascular ischemic changes. 2. No acute cervical spine fracture or traumatic malalignment. 3. Secretions in the trachea. Correlate for aspiration. Electronically Signed   By: Titus Dubin M.D.   On: 07/19/2020 16:17   CT Cervical Spine Wo Contrast  Result Date: 07/19/2020 CLINICAL DATA:  Unwitnessed fall.  On Eliquis. EXAM: CT HEAD WITHOUT CONTRAST CT CERVICAL SPINE WITHOUT CONTRAST TECHNIQUE: Multidetector CT imaging of the head and cervical spine was performed following the standard protocol without intravenous contrast. Multiplanar CT image reconstructions of the cervical spine were also generated. COMPARISON:  MRI brain dated May 28, 2017. CT head and cervical spine dated March 11, 2016. FINDINGS: CT HEAD FINDINGS Brain: No evidence of acute infarction, hemorrhage, hydrocephalus, extra-axial collection or mass lesion/mass effect. Stable atrophy and chronic microvascular ischemic changes. Slightly asymmetric hyperdensity near the left hippocampus and temporal horn is favored to represent choroid plexus. Vascular: Atherosclerotic vascular calcification of the carotid siphons. No hyperdense vessel. Skull: Normal. Negative for fracture or focal lesion. Sinuses/Orbits: Small air-fluid level in the right sphenoid sinus. Frothy secretions a left posterior ethmoid air cell. The orbits are unremarkable. Other: None. CT CERVICAL SPINE FINDINGS Alignment: No traumatic malalignment. Unchanged trace anterolisthesis at C4-C5 and C7-T1. Skull base and vertebrae: No acute fracture. No primary bone lesion or focal pathologic process. Soft tissues and spinal canal: No prevertebral fluid or swelling. No visible canal hematoma. Disc levels: Multilevel disc height loss, severe at C4-C5 and C5-C6. Moderate to severe facet uncovertebral hypertrophy throughout the cervical spine. Findings are similar to prior study Upper chest: Biapical pleuroparenchymal scarring. Other: Secretions in the trachea. IMPRESSION: 1.  No acute intracranial abnormality. Stable atrophy and chronic microvascular ischemic changes. 2. No acute cervical spine fracture or traumatic malalignment. 3. Secretions in the trachea. Correlate for aspiration. Electronically Signed   By: Titus Dubin M.D.   On: 07/19/2020 16:17   CT Lumbar Spine Wo Contrast  Result Date: 07/19/2020 CLINICAL DATA:  Fall.  Back pain. EXAM: CT LUMBAR SPINE WITHOUT CONTRAST TECHNIQUE: Multidetector CT imaging of the lumbar spine was performed without intravenous contrast administration. Multiplanar CT image reconstructions were also generated. COMPARISON:  Lumbar radiographs 07/19/2020 FINDINGS: Segmentation: Normal Alignment: Mild anterolisthesis L2-3, L3-4, L4-5 Vertebrae: Nondisplaced acute fractures of the L5 transverse process bilaterally. No vertebral body fracture. Paraspinal and other soft tissues: Atherosclerotic calcification aorta and iliac arteries without aneurysm. No paraspinous mass or adenopathy. Right renal cyst. Disc levels: T11-12: Moderate disc degeneration and spurring. Moderate foraminal narrowing bilaterally due to spurring T12-L1: Mild disc degeneration.  Negative for  stenosis. L1-2: Disc degeneration and mild spurring. No significant stenosis. Mild facet degeneration. L2-3: Disc degeneration with diffuse endplate spurring. Bilateral facet degeneration. Mild spinal stenosis. Moderate subarticular stenosis on the left and mild subarticular stenosis on the right. L3-4: Disc degeneration with disc bulging. Bilateral facet hypertrophy. Moderate subarticular stenosis on the right. Spinal canal adequate in size L4-5: Broad-based central disc protrusion with diffuse endplate spurring and bilateral facet degeneration. Moderate subarticular stenosis bilaterally L5-S1: Mild disc and mild facet degeneration. No significant stenosis. IMPRESSION: Nondisplaced fractures of the L5 transverse process bilaterally. These appear acute. No fracture of the vertebral body.  Multilevel degenerative change throughout the lumbar spine as above. Electronically Signed   By: Franchot Gallo M.D.   On: 07/19/2020 20:12    Assessment/Plan Present on Admission: . L5 vertebral fracture ([place]) . Intractable pain . Fall -Bring in for overnight observation on med surge -Lidoderm patch to the small of the back.  Norco as needed pain, Tylenol scheduled.  IV morphine for moderate to severe pain -PT/OT consult -Defer to a.m. team if orthopedic consult needed  Bilateral lower extremity cellulitis -IV Rocephin and p.o. doxycycline ordered  Question aspiration risk per CT -Swallow evaluation requested -Soft diet ordered  . Depression with anxiety -Stable, home meds resumed  . Hyperlipidemia -Stable, home meds held  Rheumatoid arthritis -Home meds ordered  . PAF (paroxysmal atrial fibrillation) ([facility]) Chronic anticoagulation -  . Rheumatoid arthritis ([place]) -  . Vascular dementia ([place]) -  Sacoya Mcgourty 07/19/2020, 9:06 PM

## 2020-07-20 DIAGNOSIS — H9190 Unspecified hearing loss, unspecified ear: Secondary | ICD-10-CM | POA: Diagnosis present

## 2020-07-20 DIAGNOSIS — H18519 Endothelial corneal dystrophy, unspecified eye: Secondary | ICD-10-CM | POA: Diagnosis present

## 2020-07-20 DIAGNOSIS — L03115 Cellulitis of right lower limb: Secondary | ICD-10-CM | POA: Diagnosis present

## 2020-07-20 DIAGNOSIS — S32059A Unspecified fracture of fifth lumbar vertebra, initial encounter for closed fracture: Secondary | ICD-10-CM | POA: Diagnosis present

## 2020-07-20 DIAGNOSIS — F418 Other specified anxiety disorders: Secondary | ICD-10-CM | POA: Diagnosis not present

## 2020-07-20 DIAGNOSIS — R52 Pain, unspecified: Secondary | ICD-10-CM | POA: Diagnosis present

## 2020-07-20 DIAGNOSIS — Z8249 Family history of ischemic heart disease and other diseases of the circulatory system: Secondary | ICD-10-CM | POA: Diagnosis not present

## 2020-07-20 DIAGNOSIS — Z820 Family history of epilepsy and other diseases of the nervous system: Secondary | ICD-10-CM | POA: Diagnosis not present

## 2020-07-20 DIAGNOSIS — Z79899 Other long term (current) drug therapy: Secondary | ICD-10-CM | POA: Diagnosis not present

## 2020-07-20 DIAGNOSIS — K219 Gastro-esophageal reflux disease without esophagitis: Secondary | ICD-10-CM | POA: Diagnosis present

## 2020-07-20 DIAGNOSIS — Z7901 Long term (current) use of anticoagulants: Secondary | ICD-10-CM | POA: Diagnosis not present

## 2020-07-20 DIAGNOSIS — M810 Age-related osteoporosis without current pathological fracture: Secondary | ICD-10-CM | POA: Diagnosis present

## 2020-07-20 DIAGNOSIS — Z888 Allergy status to other drugs, medicaments and biological substances status: Secondary | ICD-10-CM | POA: Diagnosis not present

## 2020-07-20 DIAGNOSIS — W1830XA Fall on same level, unspecified, initial encounter: Secondary | ICD-10-CM | POA: Diagnosis present

## 2020-07-20 DIAGNOSIS — F419 Anxiety disorder, unspecified: Secondary | ICD-10-CM | POA: Diagnosis present

## 2020-07-20 DIAGNOSIS — Z20822 Contact with and (suspected) exposure to covid-19: Secondary | ICD-10-CM | POA: Diagnosis present

## 2020-07-20 DIAGNOSIS — L03116 Cellulitis of left lower limb: Secondary | ICD-10-CM | POA: Diagnosis present

## 2020-07-20 DIAGNOSIS — Z947 Corneal transplant status: Secondary | ICD-10-CM | POA: Diagnosis not present

## 2020-07-20 DIAGNOSIS — F329 Major depressive disorder, single episode, unspecified: Secondary | ICD-10-CM | POA: Diagnosis present

## 2020-07-20 DIAGNOSIS — W19XXXA Unspecified fall, initial encounter: Secondary | ICD-10-CM | POA: Diagnosis not present

## 2020-07-20 DIAGNOSIS — I700 Atherosclerosis of aorta: Secondary | ICD-10-CM | POA: Diagnosis present

## 2020-07-20 DIAGNOSIS — Z885 Allergy status to narcotic agent status: Secondary | ICD-10-CM | POA: Diagnosis not present

## 2020-07-20 DIAGNOSIS — F0150 Vascular dementia without behavioral disturbance: Secondary | ICD-10-CM | POA: Diagnosis present

## 2020-07-20 DIAGNOSIS — M069 Rheumatoid arthritis, unspecified: Secondary | ICD-10-CM | POA: Diagnosis present

## 2020-07-20 DIAGNOSIS — I480 Paroxysmal atrial fibrillation: Secondary | ICD-10-CM | POA: Diagnosis present

## 2020-07-20 DIAGNOSIS — Z882 Allergy status to sulfonamides status: Secondary | ICD-10-CM | POA: Diagnosis not present

## 2020-07-20 DIAGNOSIS — E785 Hyperlipidemia, unspecified: Secondary | ICD-10-CM | POA: Diagnosis present

## 2020-07-20 DIAGNOSIS — Y92129 Unspecified place in nursing home as the place of occurrence of the external cause: Secondary | ICD-10-CM | POA: Diagnosis not present

## 2020-07-20 LAB — MAGNESIUM: Magnesium: 2.2 mg/dL (ref 1.7–2.4)

## 2020-07-20 MED ORDER — ACETAMINOPHEN 500 MG PO TABS
1000.0000 mg | ORAL_TABLET | Freq: Three times a day (TID) | ORAL | Status: DC
  Administered 2020-07-20 – 2020-07-24 (×14): 1000 mg via ORAL
  Filled 2020-07-20 (×14): qty 2

## 2020-07-20 MED ORDER — OXYCODONE HCL 5 MG PO TABS
5.0000 mg | ORAL_TABLET | ORAL | Status: DC | PRN
  Administered 2020-07-22: 5 mg via ORAL
  Filled 2020-07-20: qty 1

## 2020-07-20 MED ORDER — SODIUM CHLORIDE (HYPERTONIC) 5 % OP SOLN
1.0000 [drp] | Freq: Four times a day (QID) | OPHTHALMIC | Status: DC
  Administered 2020-07-20 – 2020-07-24 (×17): 1 [drp] via OPHTHALMIC
  Filled 2020-07-20: qty 15

## 2020-07-20 MED ORDER — POLYETHYL GLYCOL-PROPYL GLYCOL 0.4-0.3 % OP SOLN: 1.0000 [drp] | Freq: Two times a day (BID) | OPHTHALMIC | Status: DC

## 2020-07-20 MED ORDER — METHOCARBAMOL 500 MG PO TABS: 500.0000 mg | ORAL_TABLET | Freq: Four times a day (QID) | ORAL | Status: DC | PRN

## 2020-07-20 MED ORDER — POLYVINYL ALCOHOL 1.4 % OP SOLN
1.0000 [drp] | Freq: Two times a day (BID) | OPHTHALMIC | Status: DC
  Administered 2020-07-20 – 2020-07-24 (×9): 1 [drp] via OPHTHALMIC
  Filled 2020-07-20: qty 15

## 2020-07-20 MED ORDER — CALCIUM 1200 1200-1000 MG-UNIT PO CHEW: 1.0000 | CHEWABLE_TABLET | Freq: Every day | ORAL | Status: DC

## 2020-07-20 MED ORDER — PREDNISOLONE ACETATE 1 % OP SUSP
1.0000 [drp] | Freq: Every day | OPHTHALMIC | Status: DC
  Administered 2020-07-20 – 2020-07-24 (×5): 1 [drp] via OPHTHALMIC
  Filled 2020-07-20: qty 5

## 2020-07-20 NOTE — Progress Notes (Signed)
Orthopedic Tech Progress Note Patient Details:  Adrienne [name] 1922-07-21 [id number] Called in brace Patient ID: [name], female   DOB: [birth year], 86 y.o.   MRN: [id number]   [name] 07/20/2020, 3:20 PM

## 2020-07-20 NOTE — Progress Notes (Signed)
Lumbar corset delivered to pt's room.

## 2020-07-20 NOTE — Evaluation (Signed)
Physical Therapy Evaluation Patient Details Name: Adrienne [name] MRN: [id number] DOB: 1921/12/19 Today's Date: 07/20/2020   History of Present Illness  This is a 86 year old resident of an assisted nursing center.  She presents after she had a mechanical fall yesterday.  Since then she has been unable to ambulate. PMH includes: RA, HLD, dementia.  Clinical Impression  Patient received in bed, HOH, alert, not oriented. Patient reports pain all over with mobility. Requiring max +2 for supine to sit performing log rolling technique to protect spine. She is able to sit at edge of bed with significant pain increase. Requiring B UE support and min guard. Patient whimpering while seated at edge of bed. Declines further mobility at this time. She will continue to benefit from skilled PT while here to improve strength, functional independence and safety.       Follow Up Recommendations SNF;Supervision/Assistance - 24 hour    Equipment Recommendations  None recommended by PT;Other (comment) (TBD)    Recommendations for Other Services       Precautions / Restrictions Precautions Precautions: Fall Restrictions Weight Bearing Restrictions: No      Mobility  Bed Mobility Overal bed mobility: Needs Assistance Bed Mobility: Supine to Sit;Sidelying to Sit;Sit to Sidelying;Sit to Supine   Sidelying to sit: +2 for physical assistance;Max assist Supine to sit: +2 for physical assistance;Max assist Sit to supine: Max assist;+2 for physical assistance Sit to sidelying: +2 for physical assistance;Max assist    Transfers                 General transfer comment: unable due to pain  Ambulation/Gait             General Gait Details: unable  Stairs            Wheelchair Mobility    Modified Rankin (Stroke Patients Only)       Balance Overall balance assessment: Needs assistance Sitting-balance support: Feet supported;Bilateral upper extremity supported Sitting  balance-Leahy Scale: Poor Sitting balance - Comments: patient reliant on B UE support and/or external support for sitting balance                                     Pertinent Vitals/Pain Pain Assessment: Faces Faces Pain Scale: Hurts worst Pain Location: "all over" Pain Descriptors / Indicators: Aching;Grimacing;Guarding;Moaning;Discomfort Pain Intervention(s): Monitored during session;Limited activity within patient's tolerance;Repositioned    Home Living Family/patient expects to be discharged to:: [place]: Walker - 2 wheels      Prior Function Level of Independence: Independent with assistive device(s)               Hand Dominance        Extremity/Trunk Assessment   Upper Extremity Assessment Upper Extremity Assessment: Defer to OT evaluation    Lower Extremity Assessment Lower Extremity Assessment: Generalized weakness    Cervical / Trunk Assessment Cervical / Trunk Assessment: Normal  Communication   Communication: HOH  Cognition Arousal/Alertness: Awake/alert Behavior During Therapy: WFL for tasks assessed/performed Overall Cognitive Status: History of cognitive impairments - at baseline                                 General Comments: Unable to provide history, is not oriented to place, time,  situation.      General Comments General comments (skin integrity, edema, etc.): skin tear on left LE (shin). RN notified    Exercises     Assessment/Plan    PT Assessment Patient needs continued PT services  PT Problem List Decreased strength;Decreased mobility;Decreased activity tolerance;Decreased balance;Pain;Decreased cognition;Decreased safety awareness       PT Treatment Interventions Therapeutic activities;DME instruction;Gait training;Therapeutic exercise;Patient/family education;Functional mobility training    PT Goals (Current goals can be found in the Care Plan  section)  Acute Rehab PT Goals Patient Stated Goal: none stated PT Goal Formulation: Patient unable to participate in goal setting Time For Goal Achievement: 08/03/20    Frequency Min 3X/week   Barriers to discharge        Co-evaluation PT/OT/SLP Co-Evaluation/Treatment: Yes Reason for Co-Treatment: For patient/therapist safety;To address functional/ADL transfers PT goals addressed during session: Mobility/safety with mobility;Balance         AM-PAC PT "6 Clicks" Mobility  Outcome Measure Help needed turning from your back to your side while in a flat bed without using bedrails?: A Lot Help needed moving from lying on your back to sitting on the side of a flat bed without using bedrails?: A Lot Help needed moving to and from a bed to a chair (including a wheelchair)?: Total Help needed standing up from a chair using your arms (e.g., wheelchair or bedside chair)?: Total Help needed to walk in hospital room?: Total Help needed climbing 3-5 steps with a railing? : Total 6 Click Score: 8    End of Session   Activity Tolerance: Patient limited by pain Patient left: in bed;with call bell/phone within reach;with bed alarm set;with SCD's reapplied Nurse Communication: Mobility status PT Visit Diagnosis: Other abnormalities of gait and mobility (R26.89);Pain Pain - Right/Left:  (all over)    Time: 1345-1401 PT Time Calculation (min) (ACUTE ONLY): 16 min   Charges:   PT Evaluation $PT Eval Moderate Complexity: 1 Mod          Kewanna Kasprzak, PT, GCS 07/20/20,2:15 PM

## 2020-07-20 NOTE — Evaluation (Addendum)
Occupational Therapy Evaluation Patient Details Name: Adrienne [name] MRN: [id number] DOB: 1922/09/16 Today's Date: 07/20/2020    History of Present Illness This is a 86 year old resident of an assisted nursing center.  She presents after she had a mechanical fall yesterday.  Since then she has been unable to ambulate. Pt found to have sustaining L5 transverse process fx. PMH includes: RA, HLD, dementia.   Clinical Impression   This 86 y/o female presents with the above. PTA pt residing in ALF and using RW for mobility. Pt with notable limitations mostly due to pain during today's session. Pt requiring two person assist for safe completion of bed mobility, but was only able to tolerate sitting EOB for a short period of time given increased/significant pain with being upright. Pt performing simple grooming ADL with setup assist once returned to supine, requires up to [place] for LB and toileting ADL. Discussed with RN possible need for lumbar brace to increase support/comfort with additional mobility. Pt to benefit from continued acute OT services and currently recommend follow up therapy services in SNF setting to progress her safety and independence with ADL and mobility.     Follow Up Recommendations  SNF    Equipment Recommendations  Other (comment) (defer to next venue )           Precautions / Restrictions Precautions Precautions: Fall Precaution Comments: back for comfort/pain management Restrictions Weight Bearing Restrictions: No      Mobility Bed Mobility Overal bed mobility: Needs Assistance Bed Mobility: Rolling;Sidelying to Sit;Sit to Sidelying Rolling: Max assist;+2 for physical assistance;+2 for safety/equipment Sidelying to sit: +2 for physical assistance;Max assist Supine to sit: +2 for physical assistance;Max assist Sit to supine: Max assist;+2 for physical assistance Sit to sidelying: +2 for physical assistance;Max assist General bed mobility comments: Verbal  cues for technique and for using UEs to assist, requires assist for LEs over EOB and trunk elevation   Transfers                 General transfer comment: unable due to pain    Balance Overall balance assessment: Needs assistance Sitting-balance support: Feet supported;Bilateral upper extremity supported Sitting balance-Leahy Scale: Poor Sitting balance - Comments: patient reliant on B UE support and/or external support for sitting balance                                   ADL either performed or assessed with clinical judgement   ADL Overall ADL's : Needs assistance/impaired Eating/Feeding: Minimal assistance;Sitting   Grooming: Minimal assistance;Wash/dry face;Bed level Grooming Details (indicate cue type and reason): unable to release UE to perform task while sitting EOB due to pain, completed after return to supine  Upper Body Bathing: Moderate assistance;Bed level   Lower Body Bathing: Total assistance;Bed level   Upper Body Dressing : Maximal assistance;Bed level   Lower Body Dressing: Total assistance       Toileting- Clothing Manipulation and Hygiene: Total assistance         General ADL Comments: pt largely limited due to back pain today, tolerating sitting EOB but only briefly (notable increase in pain with sitting upright)                          Pertinent Vitals/Pain Pain Assessment: Faces Faces Pain Scale: Hurts worst Pain Location: "all over" Pain Descriptors / Indicators: Aching;Grimacing;Guarding;Moaning;Discomfort Pain Intervention(s): Limited activity within  patient's tolerance;Monitored during session;Repositioned     Hand Dominance     Extremity/Trunk Assessment Upper Extremity Assessment Upper Extremity Assessment: Generalized weakness   Lower Extremity Assessment Lower Extremity Assessment: Defer to PT evaluation   Cervical / Trunk Assessment Cervical / Trunk Assessment: Other exceptions Cervical / Trunk  Exceptions: L5 fx   Communication Communication Communication: HOH   Cognition Arousal/Alertness: Awake/alert Behavior During Therapy: WFL for tasks assessed/performed Overall Cognitive Status: History of cognitive impairments - at baseline                                 General Comments: Unable to provide history, is not oriented to place, time, situation.   General Comments  skin tear on left LE (shin). RN notified and present end of session to address . discussed with RN likely need to use lumbar brace for increased support/comfort     Exercises     Shoulder Instructions      Home Living Family/patient expects to be discharged to:: Assisted living                             Home Equipment: Walker - 2 wheels   Additional Comments: per chart from [place]      Prior Functioning/Environment Level of Independence: Independent with assistive device(s);Needs assistance  Gait / Transfers Assistance Needed: using RW per chart  ADL's / Homemaking Assistance Needed: pt denies assist for ADL - unsure of accuracy             OT Problem List: Decreased strength;Decreased range of motion;Decreased activity tolerance;Impaired balance (sitting and/or standing);Decreased cognition;Decreased safety awareness;Decreased knowledge of use of DME or AE;Decreased knowledge of precautions;Pain      OT Treatment/Interventions: Self-care/ADL training;Therapeutic exercise;Energy conservation;DME and/or AE instruction;Therapeutic activities;Patient/family education;Balance training;Cognitive remediation/compensation    OT Goals(Current goals can be found in the care plan section) Acute Rehab OT Goals Patient Stated Goal: less pain OT Goal Formulation: With patient Time For Goal Achievement: 08/03/20 Potential to Achieve Goals: Good  OT Frequency: Min 2X/week   Barriers to D/C:            Co-evaluation PT/OT/SLP Co-Evaluation/Treatment: Yes Reason for  Co-Treatment: For patient/therapist safety;To address functional/ADL transfers PT goals addressed during session: Mobility/safety with mobility;Balance OT goals addressed during session: ADL's and self-care      AM-PAC OT "6 Clicks" Daily Activity     Outcome Measure Help from another person eating meals?: A Little Help from another person taking care of personal grooming?: A Little Help from another person toileting, which includes using toliet, bedpan, or urinal?: Total Help from another person bathing (including washing, rinsing, drying)?: A Lot Help from another person to put on and taking off regular upper body clothing?: A Lot Help from another person to put on and taking off regular lower body clothing?: Total 6 Click Score: 12   End of Session Nurse Communication: Mobility status;Other (comment) (skin tear, may benefit from lumbar brace )  Activity Tolerance: Patient limited by pain Patient left: in bed;with call bell/phone within reach;with bed alarm set  OT Visit Diagnosis: Other abnormalities of gait and mobility (R26.89);Pain;Other symptoms and signs involving cognitive function Pain - part of body:  (back)                Time: 1344-1401 OT Time Calculation (min): 17 min Charges:  OT General Charges $OT  Visit: 1 Visit OT Evaluation $OT Eval Moderate Complexity: 1 Mod  Lou Cal, OT Acute Rehabilitation Services Pager [phone] Office [place] 07/20/2020, 4:44 PM

## 2020-07-20 NOTE — Progress Notes (Signed)
Progress Note    Adrienne [name]  OVF:[id number] DOB: [birth year]  DOA: 07/19/2020 PCP: Virgie Dad, MD    Brief Narrative:     Medical records reviewed and are as summarized below:  Adrienne [name] is an 86 y.o. female from [place], pt with an unwitnessed fall 8/28. Normally ambulatory with a walker, staff assisted pt to a chair after fall, pt unable to stand d/t new onset low back pain  Assessment/Plan:   Principal Problem:   Intractable pain Active Problems:   Rheumatoid arthritis (HCC)   Vascular dementia ([place])   Hyperlipidemia   PAF (paroxysmal atrial fibrillation) (HCC)   Depression with anxiety   Fall   L5 vertebral fracture (HCC)   Chronic anticoagulation    L5 transverse process fracture with Intractable pain due to Fall -Lidoderm patch,Tylenol scheduled, PRN oxy and IV morphine for breakthrough -bowel regimen -PT/OT consult -? Lumbar corset - defer to PT recs  Doubt Bilateral lower extremity cellulitis -d/c abx for now and monitor -? Venous insuff  Question aspiration per CT -Swallow evaluation requested -Soft diet ordered  Depression with anxiety -Stable, home meds resumed  Hyperlipidemia -Stable, home meds held  PAF (paroxysmal atrial fibrillation) (HCC) -continue eliquis  Rheumatoid arthritis ([place]) -hold home meds for now  Vascular dementia ([place]) -resume home meds  Hypercalcemia -hold PO calcium replacement -recheck in AM    Family Communication/Anticipated D/C date and plan/Code Status   DVT prophylaxis: eliquis Code Status: Full Code- will need to verify Family Communication: spoke with nephew Disposition Plan: Status is: Observation  The patient will require care spanning > 2 midnights and should be moved to inpatient because: Ongoing active pain requiring inpatient pain management  Dispo: The patient is from: ALF              Anticipated d/c is to: SNF              Anticipated d/c date is: 2 days               Patient currently is not medically stable to d/c.      Medical Consultants:    None.     Subjective:   "I really did it this time didn't I?" no pain until she moves  Objective:    Vitals:   07/19/20 2155 07/19/20 2206 07/19/20 2251 07/20/20 0755  BP: (!) 119/93  (!) 165/71 (!) 143/70  Pulse: 82  76 81  Resp: 16  20 15   Temp: 98.4 F (36.9 C)  99.2 F (37.3 C) 98.7 F (37.1 C)  TempSrc: Oral  Oral   SpO2: (!) 88% 98% 98% 94%  Weight:  70.1 kg    Height:  5\' 5"  (1.651 m)      Intake/Output Summary (Last 24 hours) at 07/20/2020 1032 Last data filed at 07/20/2020 0600 Gross per 24 hour  Intake 383.74 ml  Output --  Net 383.74 ml   Filed Weights   07/19/20 2206  Weight: 70.1 kg    Exam:  General: Appearance:     Well developed, well nourished female in no acute distress     Lungs:      respirations unlabored  Heart:    Normal heart rate.   MS:   All extremities are intact.   Neurologic:   Awake, alert, pleasant and cooperative    Data Reviewed:   I have personally reviewed following labs and imaging studies:  Labs: Labs show the following:  Basic Metabolic Panel: Recent Labs  Lab 07/19/20 1900 07/20/20 0104  NA 139  --   K 3.9  --   CL 103  --   CO2 25  --   GLUCOSE 127*  --   BUN 17  --   CREATININE 0.99  --   CALCIUM 10.6*  --   MG  --  2.2   GFR Estimated Creatinine Clearance: 33.4 mL/min (by C-G formula based on SCr of 0.99 mg/dL). Liver Function Tests: No results for input(s): AST, ALT, ALKPHOS, BILITOT, PROT, ALBUMIN in the last 168 hours. No results for input(s): LIPASE, AMYLASE in the last 168 hours. No results for input(s): AMMONIA in the last 168 hours. Coagulation profile No results for input(s): INR, PROTIME in the last 168 hours.  CBC: Recent Labs  Lab 07/19/20 1900  WBC 11.5*  NEUTROABS 9.4*  HGB 12.5  HCT 39.3  MCV 94.9  PLT 176   Cardiac Enzymes: No results for input(s): CKTOTAL, CKMB, CKMBINDEX,  TROPONINI in the last 168 hours. BNP (last 3 results) No results for input(s): PROBNP in the last 8760 hours. CBG: No results for input(s): GLUCAP in the last 168 hours. D-Dimer: No results for input(s): DDIMER in the last 72 hours. Hgb A1c: No results for input(s): HGBA1C in the last 72 hours. Lipid Profile: No results for input(s): CHOL, HDL, LDLCALC, TRIG, CHOLHDL, LDLDIRECT in the last 72 hours. Thyroid function studies: No results for input(s): TSH, T4TOTAL, T3FREE, THYROIDAB in the last 72 hours.  Invalid input(s): FREET3 Anemia work up: No results for input(s): VITAMINB12, FOLATE, FERRITIN, TIBC, IRON, RETICCTPCT in the last 72 hours. Sepsis Labs: Recent Labs  Lab 07/19/20 1900  WBC 11.5*    Microbiology Recent Results (from the past 240 hour(s))  SARS Coronavirus 2 by RT PCR (hospital order, performed in [facility] hospital lab) Nasopharyngeal Nasopharyngeal Swab     Status: None   Collection Time: 07/19/20  6:46 PM   Specimen: Nasopharyngeal Swab  Result Value Ref Range Status   SARS Coronavirus 2 NEGATIVE NEGATIVE Final    Comment: (NOTE) SARS-CoV-2 target nucleic acids are NOT DETECTED.  The SARS-CoV-2 RNA is generally detectable in upper and lower respiratory specimens during the acute phase of infection. The lowest concentration of SARS-CoV-2 viral copies this assay can detect is 250 copies / mL. A negative result does not preclude SARS-CoV-2 infection and should not be used as the sole basis for treatment or other patient management decisions.  A negative result may occur with improper specimen collection / handling, submission of specimen other than nasopharyngeal swab, presence of viral mutation(s) within the areas targeted by this assay, and inadequate number of viral copies (<250 copies / mL). A negative result must be combined with clinical observations, patient history, and epidemiological information.  Fact Sheet for Patients:     StrictlyIdeas.no  Fact Sheet for Healthcare Providers: BankingDealers.co.za  This test is not yet approved or  cleared by the [place] FDA and has been authorized for detection and/or diagnosis of SARS-CoV-2 by FDA under an Emergency Use Authorization (EUA).  This EUA will remain in effect (meaning this test can be used) for the duration of the COVID-19 declaration under Section 564(b)(1) of the Act, 21 U.S.C. section 360bbb-3(b)(1), unless the authorization is terminated or revoked sooner.  Performed at [facility], [place] [address], [place], [place] [address]     Procedures and diagnostic studies:  DG Chest 1 View  Result Date: 07/19/2020 CLINICAL DATA:  Status post fall. EXAM: CHEST  1 VIEW COMPARISON:  November 26, 2018 FINDINGS: Mild, chronic appearing diffusely increased lung markings are seen. There is no evidence of acute infiltrate, pleural effusion or pneumothorax. The heart size and mediastinal contours are within normal limits. There is marked severity calcification of the thoracic aorta. There is a chronic fracture deformity of the sixth right rib. An additional chronic deformity of the mid left clavicle is seen. Degenerative changes are noted throughout the thoracic spine. IMPRESSION: Chronic appearing interstitial lung disease without evidence of acute or active cardiopulmonary disease. Electronically Signed   By: Virgina Norfolk M.D.   On: 07/19/2020 16:28   DG Lumbar Spine 2-3 Views  Result Date: 07/19/2020 CLINICAL DATA:  Unwitnessed fall.  Low back pain. Unable to stand. EXAM: LUMBAR SPINE - 2-3 VIEW COMPARISON:  None. FINDINGS: No evidence of acute fracture. Vertebral body heights are preserved. There is 5 mm anterolisthesis of L4 on L5 that is likely degenerative. Disc space narrowing and endplate spurring most prominent at L4-L5 and L5-S1. bones are diffusely under mineralized. Multilevel facet hypertrophy. The  sacroiliac joints are congruent with mild degenerative change. Aortic atherosclerosis. IMPRESSION: 1. Multilevel degenerative disc disease and facet hypertrophy in the lumbar spine. No evidence of acute fracture. 2. Grade 1 anterolisthesis of L4 on L5, likely degenerative. Electronically Signed   By: Keith Rake M.D.   On: 07/19/2020 16:31   DG Pelvis 1-2 Views  Result Date: 07/19/2020 CLINICAL DATA:  Unwitnessed fall.  Low back pain.  Unable to stand. EXAM: PELVIS - 1-2 VIEW COMPARISON:  None. FINDINGS: The cortical margins of the bony pelvis are intact. No fracture. Pubic symphysis and sacroiliac joints are congruent. Portions of the pubic rami are partially obscured by overlying stool/bowel gas. Both femoral heads are well-seated in the respective acetabula. Moderate bilateral hip osteoarthritis with joint space narrowing. IMPRESSION: 1. No pelvic fracture. 2. Moderate bilateral hip osteoarthritis. Electronically Signed   By: Keith Rake M.D.   On: 07/19/2020 16:29   CT Head Wo Contrast  Result Date: 07/19/2020 CLINICAL DATA:  Unwitnessed fall.  On Eliquis. EXAM: CT HEAD WITHOUT CONTRAST CT CERVICAL SPINE WITHOUT CONTRAST TECHNIQUE: Multidetector CT imaging of the head and cervical spine was performed following the standard protocol without intravenous contrast. Multiplanar CT image reconstructions of the cervical spine were also generated. COMPARISON:  MRI brain dated May 28, 2017. CT head and cervical spine dated March 11, 2016. FINDINGS: CT HEAD FINDINGS Brain: No evidence of acute infarction, hemorrhage, hydrocephalus, extra-axial collection or mass lesion/mass effect. Stable atrophy and chronic microvascular ischemic changes. Slightly asymmetric hyperdensity near the left hippocampus and temporal horn is favored to represent choroid plexus. Vascular: Atherosclerotic vascular calcification of the carotid siphons. No hyperdense vessel. Skull: Normal. Negative for fracture or focal lesion.  Sinuses/Orbits: Small air-fluid level in the right sphenoid sinus. Frothy secretions a left posterior ethmoid air cell. The orbits are unremarkable. Other: None. CT CERVICAL SPINE FINDINGS Alignment: No traumatic malalignment. Unchanged trace anterolisthesis at C4-C5 and C7-T1. Skull base and vertebrae: No acute fracture. No primary bone lesion or focal pathologic process. Soft tissues and spinal canal: No prevertebral fluid or swelling. No visible canal hematoma. Disc levels: Multilevel disc height loss, severe at C4-C5 and C5-C6. Moderate to severe facet uncovertebral hypertrophy throughout the cervical spine. Findings are similar to prior study Upper chest: Biapical pleuroparenchymal scarring. Other: Secretions in the trachea. IMPRESSION: 1. No acute intracranial abnormality. Stable atrophy and chronic microvascular ischemic changes. 2. No acute  cervical spine fracture or traumatic malalignment. 3. Secretions in the trachea. Correlate for aspiration. Electronically Signed   By: Titus Dubin M.D.   On: 07/19/2020 16:17   CT Cervical Spine Wo Contrast  Result Date: 07/19/2020 CLINICAL DATA:  Unwitnessed fall.  On Eliquis. EXAM: CT HEAD WITHOUT CONTRAST CT CERVICAL SPINE WITHOUT CONTRAST TECHNIQUE: Multidetector CT imaging of the head and cervical spine was performed following the standard protocol without intravenous contrast. Multiplanar CT image reconstructions of the cervical spine were also generated. COMPARISON:  MRI brain dated May 28, 2017. CT head and cervical spine dated March 11, 2016. FINDINGS: CT HEAD FINDINGS Brain: No evidence of acute infarction, hemorrhage, hydrocephalus, extra-axial collection or mass lesion/mass effect. Stable atrophy and chronic microvascular ischemic changes. Slightly asymmetric hyperdensity near the left hippocampus and temporal horn is favored to represent choroid plexus. Vascular: Atherosclerotic vascular calcification of the carotid siphons. No hyperdense vessel.  Skull: Normal. Negative for fracture or focal lesion. Sinuses/Orbits: Small air-fluid level in the right sphenoid sinus. Frothy secretions a left posterior ethmoid air cell. The orbits are unremarkable. Other: None. CT CERVICAL SPINE FINDINGS Alignment: No traumatic malalignment. Unchanged trace anterolisthesis at C4-C5 and C7-T1. Skull base and vertebrae: No acute fracture. No primary bone lesion or focal pathologic process. Soft tissues and spinal canal: No prevertebral fluid or swelling. No visible canal hematoma. Disc levels: Multilevel disc height loss, severe at C4-C5 and C5-C6. Moderate to severe facet uncovertebral hypertrophy throughout the cervical spine. Findings are similar to prior study Upper chest: Biapical pleuroparenchymal scarring. Other: Secretions in the trachea. IMPRESSION: 1. No acute intracranial abnormality. Stable atrophy and chronic microvascular ischemic changes. 2. No acute cervical spine fracture or traumatic malalignment. 3. Secretions in the trachea. Correlate for aspiration. Electronically Signed   By: Titus Dubin M.D.   On: 07/19/2020 16:17   CT Lumbar Spine Wo Contrast  Result Date: 07/19/2020 CLINICAL DATA:  Fall.  Back pain. EXAM: CT LUMBAR SPINE WITHOUT CONTRAST TECHNIQUE: Multidetector CT imaging of the lumbar spine was performed without intravenous contrast administration. Multiplanar CT image reconstructions were also generated. COMPARISON:  Lumbar radiographs 07/19/2020 FINDINGS: Segmentation: Normal Alignment: Mild anterolisthesis L2-3, L3-4, L4-5 Vertebrae: Nondisplaced acute fractures of the L5 transverse process bilaterally. No vertebral body fracture. Paraspinal and other soft tissues: Atherosclerotic calcification aorta and iliac arteries without aneurysm. No paraspinous mass or adenopathy. Right renal cyst. Disc levels: T11-12: Moderate disc degeneration and spurring. Moderate foraminal narrowing bilaterally due to spurring T12-L1: Mild disc degeneration.   Negative for stenosis. L1-2: Disc degeneration and mild spurring. No significant stenosis. Mild facet degeneration. L2-3: Disc degeneration with diffuse endplate spurring. Bilateral facet degeneration. Mild spinal stenosis. Moderate subarticular stenosis on the left and mild subarticular stenosis on the right. L3-4: Disc degeneration with disc bulging. Bilateral facet hypertrophy. Moderate subarticular stenosis on the right. Spinal canal adequate in size L4-5: Broad-based central disc protrusion with diffuse endplate spurring and bilateral facet degeneration. Moderate subarticular stenosis bilaterally L5-S1: Mild disc and mild facet degeneration. No significant stenosis. IMPRESSION: Nondisplaced fractures of the L5 transverse process bilaterally. These appear acute. No fracture of the vertebral body. Multilevel degenerative change throughout the lumbar spine as above. Electronically Signed   By: Franchot Gallo M.D.   On: 07/19/2020 20:12    Medications:   . acetaminophen  1,000 mg Oral TID  . apixaban  2.5 mg Oral BID  . busPIRone  5 mg Oral Daily  . doxycycline  100 mg Oral Q12H  . folic acid  1  mg Oral Daily  . lidocaine  1 patch Transdermal Q24H  . memantine  10 mg Oral BID  . polyvinyl alcohol  1 drop Both Eyes BID  . prednisoLONE acetate  1 drop Right Eye Daily  . QUEtiapine  12.5 mg Oral QHS  . sertraline  50 mg Oral Daily  . sodium chloride  1 drop Both Eyes QID  . sodium chloride flush  3 mL Intravenous Q12H   Continuous Infusions: . sodium chloride 50 mL/hr at 07/19/20 2323  . sodium chloride    . cefTRIAXone (ROCEPHIN)  IV 1 g (07/19/20 2325)     LOS: 0 days   Geradine Girt  [facility]   How to contact the [facility] Attending or Consulting provider [place] or covering provider during after hours [place], for this patient?  1. Check the care team in [facility] and look for a) attending/consulting [facility] provider listed and b) the [facility] team listed 2. Log into [email or web address] and use  [place]'s universal password to access. If you do not have the password, please contact the hospital operator. 3. Locate the [facility] provider you are looking for under [facility] and page to a number that you can be directly reached. 4. If you still have difficulty reaching the provider, please page the [facility] (Director on Call) for the Hospitalists listed on amion for assistance.  07/20/2020, 10:32 AM

## 2020-07-20 NOTE — Discharge Instructions (Signed)

## 2020-07-20 NOTE — Evaluation (Signed)
Clinical/Bedside Swallow Evaluation Patient Details  Name: Adrienne [name] MRN: [id number] Date of Birth: 02-May-1922  Today's Date: 07/20/2020 Time: SLP Start Time (ACUTE ONLY): 1051 SLP Stop Time (ACUTE ONLY): 1105 SLP Time Calculation (min) (ACUTE ONLY): 14 min  Past Medical History:  Past Medical History:  Diagnosis Date  . Acid reflux disease   . Arthritis    rheumatoid  . Atrial fibrillation ([place])   . Back pain   . Bursitis of hip, right   . Cancer ([place])    skin  . Chest pain   . Confusion   . Constipation   . Fuchs' corneal dystrophy   . GERD (gastroesophageal reflux disease)   . Hard of hearing   . Knee pain   . Memory loss   . Paranoia ([place])   . Shoulder pain   . Venous insufficiency    Past Surgical History:  Past Surgical History:  Procedure Laterality Date  . CORNEAL TRANSPLANT  2012   Dr Adrienne [name]   . RECONSTRUCTION OF EYELID     eyelid cancer 10 years ago Dr Adrienne [name]   . RECONSTRUCTION OF NOSE     nose cancer/ Dr Adrienne [name] 10 years ago    HPI:  86 y.o. female with hx of GERD, HI, resident of [place], admitted after fall, sustaining L5 transverse process fx.    Assessment / Plan / Recommendation Clinical Impression  Pt presents with quite functional oropharyngeal swallow with adequate mastication, brisk swallow response, no s/s of aspiration, even when challenged with sequential sips and mixed solid/liquid consistencies.  Oral mechanism exam normal. She was able to feed herself after set-up.  No dysphagia identified.  May continue current diet with thin liquids.  RN reported no difficulty taking pills with water.  SLP service will sign off.  SLP Visit Diagnosis: Dysphagia, unspecified (R13.10)    Aspiration Risk  No limitations    Diet Recommendation   she is currently on soft diet, may be advanced to regulars per her preference; thin liquids  Medication Administration: Whole meds with liquid    Other  Recommendations Oral Care Recommendations: Oral  care BID   Follow up Recommendations None      Frequency and Duration            Prognosis        Swallow Study   General Date of Onset: 07/19/20 HPI: 86 y.o. female with hx of GERD, HI, resident of [place], admitted after fall, sustaining L5 transverse process fx.  Type of Study: Bedside Swallow Evaluation Previous Swallow Assessment: no Diet Prior to this Study: Thin liquids;Other (Comment) (soft) Temperature Spikes Noted: No Respiratory Status: Room air History of Recent Intubation: No Behavior/Cognition: Alert;Cooperative;Pleasant mood Oral Cavity Assessment: Within Functional Limits Oral Care Completed by SLP: Recent completion by staff Oral Cavity - Dentition: Adequate natural dentition Vision: Functional for self-feeding Self-Feeding Abilities: Able to feed self Patient Positioning: Upright in bed Baseline Vocal Quality: Normal Volitional Cough: Strong Volitional Swallow: Able to elicit    Oral/Motor/Sensory Function Overall Oral Motor/Sensory Function: Within functional limits   Ice Chips Ice chips: Within functional limits   Thin Liquid Thin Liquid: Within functional limits    Nectar Thick Nectar Thick Liquid: Not tested   Honey Thick Honey Thick Liquid: Not tested   Puree Puree: Within functional limits   Solid     Solid: Within functional limits      Adrienne [name] 07/20/2020,11:10 AM  Adrienne [name], [place] CCC/SLP Acute Rehabilitation Services  Office number [phone] Pager [phone]

## 2020-07-21 LAB — BASIC METABOLIC PANEL
Anion gap: 7 (ref 5–15)
BUN: 23 mg/dL (ref 8–23)
CO2: 25 mmol/L (ref 22–32)
Calcium: 9.2 mg/dL (ref 8.9–10.3)
Chloride: 108 mmol/L (ref 98–111)
Creatinine, Ser: 1.07 mg/dL — ABNORMAL HIGH (ref 0.44–1.00)
GFR calc Af Amer: 51 mL/min — ABNORMAL LOW (ref >60)
GFR calc non Af Amer: 44 mL/min — ABNORMAL LOW (ref >60)
Glucose, Bld: 102 mg/dL — ABNORMAL HIGH (ref 70–99)
Potassium: 4.1 mmol/L (ref 3.5–5.1)
Sodium: 140 mmol/L (ref 135–145)

## 2020-07-21 LAB — CBC
HCT: 34.7 % — ABNORMAL LOW (ref 36.0–46.0)
Hemoglobin: 11.2 g/dL — ABNORMAL LOW (ref 12.0–15.0)
MCH: 31.4 pg (ref 26.0–34.0)
MCHC: 32.3 g/dL (ref 30.0–36.0)
MCV: 97.2 fL (ref 80.0–100.0)
Platelets: 153 10*3/uL (ref 150–400)
RBC: 3.57 MIL/uL — ABNORMAL LOW (ref 3.87–5.11)
RDW: 14.6 % (ref 11.5–15.5)
WBC: 8 10*3/uL (ref 4.0–10.5)
nRBC: 0 % (ref 0.0–0.2)

## 2020-07-21 MED ORDER — WHITE PETROLATUM EX OINT
TOPICAL_OINTMENT | CUTANEOUS | Status: AC
  Filled 2020-07-21: qty 28.35

## 2020-07-21 MED ORDER — DOCUSATE SODIUM 100 MG PO CAPS
100.0000 mg | ORAL_CAPSULE | Freq: Two times a day (BID) | ORAL | Status: DC
  Administered 2020-07-21 – 2020-07-24 (×7): 100 mg via ORAL
  Filled 2020-07-21 (×7): qty 1

## 2020-07-21 NOTE — Plan of Care (Signed)

## 2020-07-21 NOTE — NC FL2 (Signed)
[place] MEDICAID FL2 LEVEL OF CARE SCREENING TOOL     IDENTIFICATION  Patient Name: Adrienne [name] Birthdate: 17-Dec-1921 Sex: female Admission Date (Current Location): 07/19/2020  [facility] and [place] Number:  Herbalist and Address:  [facility] [place], [place] [address] [place], [place] [address]      Provider Number: [id number]  Attending Physician Name and Address:  Geradine Girt, DO  Relative Name and Phone Number:       Current Level of Care: Hospital Recommended Level of Care: [place] Prior Approval Number:    Date Approved/Denied:   PASRR Number: [id number]  Discharge Plan: SNF    Current Diagnoses: Patient Active Problem List   Diagnosis Date Noted  . Pain 07/20/2020  . L5 vertebral fracture ([place]) 07/19/2020  . Intractable pain 07/19/2020  . Chronic anticoagulation 07/19/2020  . Gait abnormality 06/13/2020  . Right hip pain 06/13/2020  . Right inguinal hernia 02/20/2020  . Fall 10/16/2019  . Depression with anxiety 07/27/2019  . PAF (paroxysmal atrial fibrillation) ([place]) 06/03/2019  . Rheumatoid arthritis ([place]) 04/24/2019  . Vascular dementia ([place]) 04/24/2019  . Hyperlipidemia 04/24/2019  . Osteoporosis 04/24/2019    Orientation RESPIRATION BLADDER Height & Weight     Self (Hx of dementia, memory deficits)  Normal External catheter Weight: 70.1 kg Height:  5\' 5"  (165.1 cm)  BEHAVIORAL SYMPTOMS/MOOD NEUROLOGICAL BOWEL NUTRITION STATUS      Continent Diet (refer to d/c summary)  AMBULATORY STATUS COMMUNICATION OF NEEDS Skin   Extensive Assist Verbally Skin abrasions, Bruising (R arm,  L leg)                       Personal Care Assistance Level of Assistance  Bathing, Feeding, Dressing Bathing Assistance: Maximum assistance Feeding assistance: Limited assistance Dressing Assistance: Maximum assistance     Functional Limitations Info  Sight, Hearing, Speech Sight Info: Adequate Hearing Info:  Adequate Speech Info: Adequate    SPECIAL CARE FACTORS FREQUENCY  PT (By licensed PT), OT (By licensed OT)     PT Frequency: 5x/week, evaluate and treat OT Frequency: 5x/week, evaluate and treat            Contractures Contractures Info: Not present    Additional Factors Info  Code Status, Allergies, Psychotropic Code Status Info: Full Code Allergies Info: Novocain Procaine, Sulfamethoxazole-trimethoprim, Tape, Codeine, Neosporin Neomycin-bacitracin Zn-polymyx Psychotropic Info: Seroquel         Current Medications (07/21/2020):  This is the current hospital active medication list Current Facility-Administered Medications  Medication Dose Route Frequency Provider Last Rate Last Admin  . 0.9 %  sodium chloride infusion   Intravenous Continuous Quintella Baton, MD 50 mL/hr at 07/20/20 2042 New Bag at 07/20/20 2042  . 0.9 %  sodium chloride infusion  250 mL Intravenous PRN Crosley, Debby, MD      . acetaminophen (TYLENOL) tablet 1,000 mg  1,000 mg Oral TID Eulogio Bear U, DO   1,000 mg at 07/21/20 0905  . apixaban (ELIQUIS) tablet 2.5 mg  2.5 mg Oral BID Quintella Baton, MD   2.5 mg at 07/21/20 0909  . busPIRone (BUSPAR) tablet 5 mg  5 mg Oral Daily Crosley, Debby, MD   5 mg at 07/21/20 0909  . folic acid (FOLVITE) tablet 1 mg  1 mg Oral Daily Crosley, Debby, MD   1 mg at 07/21/20 0905  . lidocaine (LIDODERM) 5 % 1 patch  1 patch Transdermal Q24H Crosley, Debby,  MD   1 patch at 07/20/20 2151  . memantine (NAMENDA) tablet 10 mg  10 mg Oral BID Quintella Baton, MD   10 mg at 07/21/20 0905  . methocarbamol (ROBAXIN) tablet 500 mg  500 mg Oral Q6H PRN Eliseo Squires, Jessica U, DO      . morphine 2 MG/ML injection 1 mg  1 mg Intravenous Q4H PRN Crosley, Debby, MD      . ondansetron (ZOFRAN) injection 4 mg  4 mg Intravenous Q6H PRN Crosley, Debby, MD      . oxyCODONE (Oxy IR/ROXICODONE) immediate release tablet 5 mg  5 mg Oral Q4H PRN Vann, Jessica U, DO      . polyvinyl alcohol (LIQUIFILM TEARS)  1.4 % ophthalmic solution 1 drop  1 drop Both Eyes BID [place], Yujing Z, RPH   1 drop at 07/21/20 0906  . prednisoLONE acetate (PRED FORTE) 1 % ophthalmic suspension 1 drop  1 drop Right Eye Daily Irelynd, Zumstein U, DO   1 drop at 07/21/20 0906  . QUEtiapine (SEROQUEL) tablet 12.5 mg  12.5 mg Oral QHS Crosley, Debby, MD   12.5 mg at 07/20/20 2148  . sertraline (ZOLOFT) tablet 50 mg  50 mg Oral Daily Quintella Baton, MD   50 mg at 07/21/20 0905  . sodium chloride (MURO 128) 5 % ophthalmic solution 1 drop  1 drop Both Eyes QID Eulogio Bear U, DO   1 drop at 07/21/20 0906  . sodium chloride flush (NS) 0.9 % injection 3 mL  3 mL Intravenous Q12H Crosley, Debby, MD   3 mL at 07/21/20 0909  . sodium chloride flush (NS) 0.9 % injection 3 mL  3 mL Intravenous PRN Quintella Baton, MD         Discharge Medications: Please see discharge summary for a list of discharge medications.  Relevant Imaging Results:  Relevant Lab Results:   Additional Information SS# [id number]  Sharin Mons, RN

## 2020-07-21 NOTE — Progress Notes (Signed)
Progress Note    Garland Smouse Pressnell  QQI:[id number] DOB: 19-Dec-1921  DOA: 07/19/2020 PCP: Virgie Dad, MD    Brief Narrative:     Medical records reviewed and are as summarized below:  Adrienne [name] is an 86 y.o. female from [place], pt with an unwitnessed fall 8/28. Normally ambulatory with a walker, staff assisted pt to a chair after fall, pt unable to stand d/t new onset low back pain.  Found to have b/l L5 transverse process  Assessment/Plan:   Principal Problem:   Intractable pain Active Problems:   Rheumatoid arthritis (HCC)   Vascular dementia (HCC)   Hyperlipidemia   PAF (paroxysmal atrial fibrillation) (HCC)   Depression with anxiety   Fall   L5 vertebral fracture (HCC)   Chronic anticoagulation   Pain    L5 transverse process fracture with Intractable pain due to Fall -Lidoderm patch,Tylenol scheduled, PRN oxy and IV morphine for breakthrough -bowel regimen -no numbness or tingling in legs -PT/OT consult- SNF (will need to be pre-medicated with pain meds prior to PT evals) - Lumbar corset while up  Doubt Bilateral lower extremity cellulitis -d/c abx for now and monitor -? Venous insuff  Question aspiration per CT -no signs of aspiration with speech eval  Depression with anxiety -Stable, home meds resumed  Hyperlipidemia -Stable, home meds held  PAF (paroxysmal atrial fibrillation) (HCC) -continue eliquis  Rheumatoid arthritis ([place]) -hold home meds for now  Vascular dementia ([place]) -resume home meds  Hypercalcemia -hold PO calcium replacement -improved with hydration    Family Communication/Anticipated D/C date and plan/Code Status   DVT prophylaxis: eliquis Code Status: Full Code- will need to verify Family Communication: spoke with nephew 8/29 Disposition Plan: Status is: inpt    Dispo: The patient is from: ALF              Anticipated d/c is to: SNF              Anticipated d/c date is: 2 days               Patient currently is not medically stable to d/c.- pain control      Medical Consultants:    None.     Subjective:  Says her pain in better this AM when she moves Nursing reports some combativeness this AM with staff  Objective:    Vitals:   07/20/20 0755 07/20/20 1407 07/20/20 2048 07/21/20 0530  BP: (!) 143/70 140/66 (!) 148/56 (!) 182/65  Pulse: 81 77 78 90  Resp: 15 16 16 20   Temp: 98.7 F (37.1 C) 98.2 F (36.8 C) 98.2 F (36.8 C) (!) 97.3 F (36.3 C)  TempSrc:  Oral Oral Axillary  SpO2: 94% 91% 94% 97%  Weight:      Height:        Intake/Output Summary (Last 24 hours) at 07/21/2020 0925 Last data filed at 07/21/2020 0500 Gross per 24 hour  Intake 1198.33 ml  Output 850 ml  Net 348.33 ml   Filed Weights   07/19/20 2206  Weight: 70.1 kg    Exam:   General: Appearance:     Overweight female in no acute distress     Lungs:    respirations unlabored  Heart:    Normal heart rate.  MS:   All extremities are intact.   Neurologic:   Awake, alert    Data Reviewed:   I have personally reviewed following labs and imaging studies:  Labs:  Labs show the following:   Basic Metabolic Panel: Recent Labs  Lab 07/19/20 1900 07/20/20 0104 07/21/20 0342  NA 139  --  140  K 3.9  --  4.1  CL 103  --  108  CO2 25  --  25  GLUCOSE 127*  --  102*  BUN 17  --  23  CREATININE 0.99  --  1.07*  CALCIUM 10.6*  --  9.2  MG  --  2.2  --    GFR Estimated Creatinine Clearance: 30.9 mL/min (A) (by C-G formula based on SCr of 1.07 mg/dL (H)). Liver Function Tests: No results for input(s): AST, ALT, ALKPHOS, BILITOT, PROT, ALBUMIN in the last 168 hours. No results for input(s): LIPASE, AMYLASE in the last 168 hours. No results for input(s): AMMONIA in the last 168 hours. Coagulation profile No results for input(s): INR, PROTIME in the last 168 hours.  CBC: Recent Labs  Lab 07/19/20 1900 07/21/20 0342  WBC 11.5* 8.0  NEUTROABS 9.4*  --   HGB 12.5 11.2*    HCT 39.3 34.7*  MCV 94.9 97.2  PLT 176 153   Cardiac Enzymes: No results for input(s): CKTOTAL, CKMB, CKMBINDEX, TROPONINI in the last 168 hours. BNP (last 3 results) No results for input(s): PROBNP in the last 8760 hours. CBG: No results for input(s): GLUCAP in the last 168 hours. D-Dimer: No results for input(s): DDIMER in the last 72 hours. Hgb A1c: No results for input(s): HGBA1C in the last 72 hours. Lipid Profile: No results for input(s): CHOL, HDL, LDLCALC, TRIG, CHOLHDL, LDLDIRECT in the last 72 hours. Thyroid function studies: No results for input(s): TSH, T4TOTAL, T3FREE, THYROIDAB in the last 72 hours.  Invalid input(s): FREET3 Anemia work up: No results for input(s): VITAMINB12, FOLATE, FERRITIN, TIBC, IRON, RETICCTPCT in the last 72 hours. Sepsis Labs: Recent Labs  Lab 07/19/20 1900 07/21/20 0342  WBC 11.5* 8.0    Microbiology Recent Results (from the past 240 hour(s))  SARS Coronavirus 2 by RT PCR (hospital order, performed in [facility] hospital lab) Nasopharyngeal Nasopharyngeal Swab     Status: None   Collection Time: 07/19/20  6:46 PM   Specimen: Nasopharyngeal Swab  Result Value Ref Range Status   SARS Coronavirus 2 NEGATIVE NEGATIVE Final    Comment: (NOTE) SARS-CoV-2 target nucleic acids are NOT DETECTED.  The SARS-CoV-2 RNA is generally detectable in upper and lower respiratory specimens during the acute phase of infection. The lowest concentration of SARS-CoV-2 viral copies this assay can detect is 250 copies / mL. A negative result does not preclude SARS-CoV-2 infection and should not be used as the sole basis for treatment or other patient management decisions.  A negative result may occur with improper specimen collection / handling, submission of specimen other than nasopharyngeal swab, presence of viral mutation(s) within the areas targeted by this assay, and inadequate number of viral copies (<250 copies / mL). A negative result must be  combined with clinical observations, patient history, and epidemiological information.  Fact Sheet for Patients:   StrictlyIdeas.no  Fact Sheet for Healthcare Providers: BankingDealers.co.za  This test is not yet approved or  cleared by the [place] FDA and has been authorized for detection and/or diagnosis of SARS-CoV-2 by FDA under an Emergency Use Authorization (EUA).  This EUA will remain in effect (meaning this test can be used) for the duration of the COVID-19 declaration under Section 564(b)(1) of the Act, 21 U.S.C. section 360bbb-3(b)(1), unless the authorization is terminated or revoked  sooner.  Performed at [facility], [place] [address], [place], [place] [address]     Procedures and diagnostic studies:  DG Chest 1 View  Result Date: 07/19/2020 CLINICAL DATA:  Status post fall. EXAM: CHEST  1 VIEW COMPARISON:  November 26, 2018 FINDINGS: Mild, chronic appearing diffusely increased lung markings are seen. There is no evidence of acute infiltrate, pleural effusion or pneumothorax. The heart size and mediastinal contours are within normal limits. There is marked severity calcification of the thoracic aorta. There is a chronic fracture deformity of the sixth right rib. An additional chronic deformity of the mid left clavicle is seen. Degenerative changes are noted throughout the thoracic spine. IMPRESSION: Chronic appearing interstitial lung disease without evidence of acute or active cardiopulmonary disease. Electronically Signed   By: Virgina Norfolk M.D.   On: 07/19/2020 16:28   DG Lumbar Spine 2-3 Views  Result Date: 07/19/2020 CLINICAL DATA:  Unwitnessed fall.  Low back pain. Unable to stand. EXAM: LUMBAR SPINE - 2-3 VIEW COMPARISON:  None. FINDINGS: No evidence of acute fracture. Vertebral body heights are preserved. There is 5 mm anterolisthesis of L4 on L5 that is likely degenerative. Disc space narrowing and endplate  spurring most prominent at L4-L5 and L5-S1. bones are diffusely under mineralized. Multilevel facet hypertrophy. The sacroiliac joints are congruent with mild degenerative change. Aortic atherosclerosis. IMPRESSION: 1. Multilevel degenerative disc disease and facet hypertrophy in the lumbar spine. No evidence of acute fracture. 2. Grade 1 anterolisthesis of L4 on L5, likely degenerative. Electronically Signed   By: Keith Rake M.D.   On: 07/19/2020 16:31   DG Pelvis 1-2 Views  Result Date: 07/19/2020 CLINICAL DATA:  Unwitnessed fall.  Low back pain.  Unable to stand. EXAM: PELVIS - 1-2 VIEW COMPARISON:  None. FINDINGS: The cortical margins of the bony pelvis are intact. No fracture. Pubic symphysis and sacroiliac joints are congruent. Portions of the pubic rami are partially obscured by overlying stool/bowel gas. Both femoral heads are well-seated in the respective acetabula. Moderate bilateral hip osteoarthritis with joint space narrowing. IMPRESSION: 1. No pelvic fracture. 2. Moderate bilateral hip osteoarthritis. Electronically Signed   By: Keith Rake M.D.   On: 07/19/2020 16:29   CT Head Wo Contrast  Result Date: 07/19/2020 CLINICAL DATA:  Unwitnessed fall.  On Eliquis. EXAM: CT HEAD WITHOUT CONTRAST CT CERVICAL SPINE WITHOUT CONTRAST TECHNIQUE: Multidetector CT imaging of the head and cervical spine was performed following the standard protocol without intravenous contrast. Multiplanar CT image reconstructions of the cervical spine were also generated. COMPARISON:  MRI brain dated May 28, 2017. CT head and cervical spine dated March 11, 2016. FINDINGS: CT HEAD FINDINGS Brain: No evidence of acute infarction, hemorrhage, hydrocephalus, extra-axial collection or mass lesion/mass effect. Stable atrophy and chronic microvascular ischemic changes. Slightly asymmetric hyperdensity near the left hippocampus and temporal horn is favored to represent choroid plexus. Vascular: Atherosclerotic vascular  calcification of the carotid siphons. No hyperdense vessel. Skull: Normal. Negative for fracture or focal lesion. Sinuses/Orbits: Small air-fluid level in the right sphenoid sinus. Frothy secretions a left posterior ethmoid air cell. The orbits are unremarkable. Other: None. CT CERVICAL SPINE FINDINGS Alignment: No traumatic malalignment. Unchanged trace anterolisthesis at C4-C5 and C7-T1. Skull base and vertebrae: No acute fracture. No primary bone lesion or focal pathologic process. Soft tissues and spinal canal: No prevertebral fluid or swelling. No visible canal hematoma. Disc levels: Multilevel disc height loss, severe at C4-C5 and C5-C6. Moderate to severe facet uncovertebral hypertrophy throughout  the cervical spine. Findings are similar to prior study Upper chest: Biapical pleuroparenchymal scarring. Other: Secretions in the trachea. IMPRESSION: 1. No acute intracranial abnormality. Stable atrophy and chronic microvascular ischemic changes. 2. No acute cervical spine fracture or traumatic malalignment. 3. Secretions in the trachea. Correlate for aspiration. Electronically Signed   By: Titus Dubin M.D.   On: 07/19/2020 16:17   CT Cervical Spine Wo Contrast  Result Date: 07/19/2020 CLINICAL DATA:  Unwitnessed fall.  On Eliquis. EXAM: CT HEAD WITHOUT CONTRAST CT CERVICAL SPINE WITHOUT CONTRAST TECHNIQUE: Multidetector CT imaging of the head and cervical spine was performed following the standard protocol without intravenous contrast. Multiplanar CT image reconstructions of the cervical spine were also generated. COMPARISON:  MRI brain dated May 28, 2017. CT head and cervical spine dated March 11, 2016. FINDINGS: CT HEAD FINDINGS Brain: No evidence of acute infarction, hemorrhage, hydrocephalus, extra-axial collection or mass lesion/mass effect. Stable atrophy and chronic microvascular ischemic changes. Slightly asymmetric hyperdensity near the left hippocampus and temporal horn is favored to represent  choroid plexus. Vascular: Atherosclerotic vascular calcification of the carotid siphons. No hyperdense vessel. Skull: Normal. Negative for fracture or focal lesion. Sinuses/Orbits: Small air-fluid level in the right sphenoid sinus. Frothy secretions a left posterior ethmoid air cell. The orbits are unremarkable. Other: None. CT CERVICAL SPINE FINDINGS Alignment: No traumatic malalignment. Unchanged trace anterolisthesis at C4-C5 and C7-T1. Skull base and vertebrae: No acute fracture. No primary bone lesion or focal pathologic process. Soft tissues and spinal canal: No prevertebral fluid or swelling. No visible canal hematoma. Disc levels: Multilevel disc height loss, severe at C4-C5 and C5-C6. Moderate to severe facet uncovertebral hypertrophy throughout the cervical spine. Findings are similar to prior study Upper chest: Biapical pleuroparenchymal scarring. Other: Secretions in the trachea. IMPRESSION: 1. No acute intracranial abnormality. Stable atrophy and chronic microvascular ischemic changes. 2. No acute cervical spine fracture or traumatic malalignment. 3. Secretions in the trachea. Correlate for aspiration. Electronically Signed   By: Titus Dubin M.D.   On: 07/19/2020 16:17   CT Lumbar Spine Wo Contrast  Result Date: 07/19/2020 CLINICAL DATA:  Fall.  Back pain. EXAM: CT LUMBAR SPINE WITHOUT CONTRAST TECHNIQUE: Multidetector CT imaging of the lumbar spine was performed without intravenous contrast administration. Multiplanar CT image reconstructions were also generated. COMPARISON:  Lumbar radiographs 07/19/2020 FINDINGS: Segmentation: Normal Alignment: Mild anterolisthesis L2-3, L3-4, L4-5 Vertebrae: Nondisplaced acute fractures of the L5 transverse process bilaterally. No vertebral body fracture. Paraspinal and other soft tissues: Atherosclerotic calcification aorta and iliac arteries without aneurysm. No paraspinous mass or adenopathy. Right renal cyst. Disc levels: T11-12: Moderate disc  degeneration and spurring. Moderate foraminal narrowing bilaterally due to spurring T12-L1: Mild disc degeneration.  Negative for stenosis. L1-2: Disc degeneration and mild spurring. No significant stenosis. Mild facet degeneration. L2-3: Disc degeneration with diffuse endplate spurring. Bilateral facet degeneration. Mild spinal stenosis. Moderate subarticular stenosis on the left and mild subarticular stenosis on the right. L3-4: Disc degeneration with disc bulging. Bilateral facet hypertrophy. Moderate subarticular stenosis on the right. Spinal canal adequate in size L4-5: Broad-based central disc protrusion with diffuse endplate spurring and bilateral facet degeneration. Moderate subarticular stenosis bilaterally L5-S1: Mild disc and mild facet degeneration. No significant stenosis. IMPRESSION: Nondisplaced fractures of the L5 transverse process bilaterally. These appear acute. No fracture of the vertebral body. Multilevel degenerative change throughout the lumbar spine as above. Electronically Signed   By: Franchot Gallo M.D.   On: 07/19/2020 20:12    Medications:    acetaminophen  1,000 mg Oral TID   apixaban  2.5 mg Oral BID   busPIRone  5 mg Oral Daily   folic acid  1 mg Oral Daily   lidocaine  1 patch Transdermal Q24H   memantine  10 mg Oral BID   polyvinyl alcohol  1 drop Both Eyes BID   prednisoLONE acetate  1 drop Right Eye Daily   QUEtiapine  12.5 mg Oral QHS   sertraline  50 mg Oral Daily   sodium chloride  1 drop Both Eyes QID   sodium chloride flush  3 mL Intravenous Q12H   Continuous Infusions:  sodium chloride 50 mL/hr at 07/20/20 2042   sodium chloride       LOS: 1 day   Geradine Girt  [facility]   How to contact the [facility] Attending or Consulting provider [place] or covering provider during after hours [place], for this patient?  1. Check the care team in [facility] and look for a) attending/consulting TR[facility] provider listed and b) the [facility] team listed 2. Log  into [email or web address] and use [place]'s universal password to access. If you do not have the password, please contact the hospital operator. 3. Locate the [facility] provider you are looking for under [facility] and page to a number that you can be directly reached. 4. If you still have difficulty reaching the provider, please page the [facility] (Director on Call) for the Hospitalists listed on amion for assistance.  07/21/2020, 9:25 AM

## 2020-07-21 NOTE — TOC Initial Note (Addendum)
Transition of Care [facility]) - Initial/Assessment Note    Patient Details  Name: Adrienne [name] MRN: [id number] Date of Birth: 1922-06-03  Transition of Care [facility]) CM/SW Contact:    Adrienne Mons, RN Phone Number: 07/21/2020, 11:00 AM  Clinical Narrative:       Presented after a fall @ [facility]. Suffered L5 vertebral fracture. Hx of dementia.    RNCM received consult for possible SNF placement at time of discharge. RNCM spoke with patient's nephew Adrienne [name] [facility]) regarding PT recommendation of SNF placement at time of discharge. Adrienne [name] reported he understood given patient's current physical needs and fall risk. Adrienne [name] expressed understanding of PT's recommendation and is agreeable to SNF placement at the time of discharge for aunt. Adrienne [name] reports preference for  [facility]. RNCM discussed insurance authorization process and shared Medicare SNF ratings list.  No further questions reported at this time. RNCM to continue to follow and assist with discharge planning needs. Adrienne [name])     [phone]       No updated COVID needed per admission liaison @ [facility].  Expected Discharge Plan: Skilled Nursing Facility Barriers to Discharge: Continued Medical Work up   Patient Goals and CMS Choice        Expected Discharge Plan and Services Expected Discharge Plan: [place]                                              Prior Living Arrangements/Services                       Activities of Daily Living Home Assistive Devices/Equipment: Environmental consultant (specify type) ADL Screening (condition at time of admission) Patient's cognitive ability adequate to safely complete daily activities?: No Is the patient deaf or have difficulty hearing?: Yes Does the patient have difficulty seeing, even when wearing glasses/contacts?: Yes Does the patient have difficulty concentrating, remembering, or making decisions?: Yes Patient able  to express need for assistance with ADLs?: Yes Does the patient have difficulty dressing or bathing?: Yes Independently performs ADLs?: No Communication: Independent Dressing (OT): Dependent Is this a change from baseline?: Change from baseline, expected to last >3 days Grooming: Dependent Is this a change from baseline?: Change from baseline, expected to last >3 days Feeding: Independent with device (comment) Is this a change from baseline?: Change from baseline, expected to last >3 days Bathing: Dependent Is this a change from baseline?: Change from baseline, expected to last >3 days Toileting: Dependent Is this a change from baseline?: Change from baseline, expected to last >3days In/Out Bed: Dependent Is this a change from baseline?: Change from baseline, expected to last >3 days Walks in Home: Dependent Is this a change from baseline?: Change from baseline, expected to last >3 days Does the patient have difficulty walking or climbing stairs?: Yes Weakness of Legs: Both Weakness of Arms/Hands: Both  Permission Sought/Granted                  Emotional Assessment              Admission diagnosis:  Intractable pain [R52] Fall, initial encounter [W19.XXXA] Acute low back pain without sciatica, unspecified back pain laterality [M54.5] Pain [R52] Patient Active Problem List   Diagnosis Date Noted  . Pain 07/20/2020  . L5 vertebral fracture ([place])  07/19/2020  . Intractable pain 07/19/2020  . Chronic anticoagulation 07/19/2020  . Gait abnormality 06/13/2020  . Right hip pain 06/13/2020  . Right inguinal hernia 02/20/2020  . Fall 10/16/2019  . Depression with anxiety 07/27/2019  . PAF (paroxysmal atrial fibrillation) ([place]) 06/03/2019  . Rheumatoid arthritis ([place]) 04/24/2019  . Vascular dementia ([place]) 04/24/2019  . Hyperlipidemia 04/24/2019  . Osteoporosis 04/24/2019   PCP:  Virgie Dad, MD Pharmacy:  No Pharmacies Listed    Social Determinants of Health  (SDOH) Interventions    Readmission Risk Interventions No flowsheet data found.

## 2020-07-22 ENCOUNTER — Encounter: Payer: Self-pay | Admitting: Internal Medicine

## 2020-07-22 MED ORDER — METHOCARBAMOL 500 MG PO TABS
500.0000 mg | ORAL_TABLET | Freq: Three times a day (TID) | ORAL | Status: DC
  Administered 2020-07-22 – 2020-07-24 (×7): 500 mg via ORAL
  Filled 2020-07-22 (×7): qty 1

## 2020-07-22 NOTE — Progress Notes (Signed)
Physical Therapy Treatment Patient Details Name: Adrienne [name] MRN: [id number] DOB: 28-Feb-1922 Today's Date: 07/22/2020    History of Present Illness This is a 86 year old resident of an assisted nursing center.  She presents after she had a mechanical fall yesterday.  Since then she has been unable to ambulate. Pt found to have sustaining L5 transverse process fx. PMH includes: RA, HLD, dementia.    PT Comments    Patient received in bed, polite, very HOH. She is agreeable to PT session. Requires multimodal cues for assisting with bed mobility (rolling). She is able to roll with max assist, but due to pain becomes resistant. Attempted to assist her to sitting on edge of bed, but she is unable to tolerate due to pain. RN medicated her 30 minutes prior to session.  Patient will continue to benefit from skilled PT while here if tolerates to improve functional independence.        Follow Up Recommendations  SNF;Supervision/Assistance - 24 hour     Equipment Recommendations  None recommended by PT;Other (comment)    Recommendations for Other Services       Precautions / Restrictions Precautions Precautions: Fall;Back Precaution Booklet Issued: No Precaution Comments: back for comfort/pain management Required Braces or Orthoses: Spinal Brace Spinal Brace: Lumbar corset Restrictions Weight Bearing Restrictions: No    Mobility  Bed Mobility Overal bed mobility: Needs Assistance Bed Mobility: Rolling;Sidelying to Sit Rolling: Max assist;+2 for physical assistance Sidelying to sit: Max assist;+2 for physical assistance       General bed mobility comments: Multimodal cues for assisting with rolling, hand over hand to reach for rail. Unable to get fully sitting up on side of bed despite max assist due to resistance/pain.  Transfers                 General transfer comment: unable due to pain  Ambulation/Gait             General Gait Details: unable   Stairs              Wheelchair Mobility    Modified Rankin (Stroke Patients Only)       Balance                                            Cognition Arousal/Alertness: Awake/alert Behavior During Therapy: WFL for tasks assessed/performed Overall Cognitive Status: Within Functional Limits for tasks assessed                                        Exercises      General Comments        Pertinent Vitals/Pain Pain Assessment: Faces Faces Pain Scale: Hurts worst Pain Location: resists movement when attempting to roll or sit up due to severe pain Pain Descriptors / Indicators: Aching;Grimacing;Guarding;Discomfort Pain Intervention(s): Limited activity within patient's tolerance;Monitored during session;Repositioned;Premedicated before session    Home Living                      Prior Function            PT Goals (current goals can now be found in the care plan section) Acute Rehab PT Goals Patient Stated Goal: less pain PT Goal Formulation: Patient unable to participate in goal setting Time  For Goal Achievement: 08/03/20 Progress towards PT goals: Not progressing toward goals - comment (extremely pain limited)    Frequency    Min 3X/week      PT Plan Current plan remains appropriate    Co-evaluation              AM-PAC PT "6 Clicks" Mobility   Outcome Measure  Help needed turning from your back to your side while in a flat bed without using bedrails?: Total Help needed moving from lying on your back to sitting on the side of a flat bed without using bedrails?: Total Help needed moving to and from a bed to a chair (including a wheelchair)?: Total Help needed standing up from a chair using your arms (e.g., wheelchair or bedside chair)?: Total Help needed to walk in hospital room?: Total Help needed climbing 3-5 steps with a railing? : Total 6 Click Score: 6    End of Session   Activity Tolerance: Patient  limited by pain Patient left: in bed;with bed alarm set;with call bell/phone within reach;with SCD's reapplied Nurse Communication: Mobility status PT Visit Diagnosis: Other abnormalities of gait and mobility (R26.89);Pain Pain - part of body:  (back)     Time: 1694-5038 PT Time Calculation (min) (ACUTE ONLY): 15 min  Charges:  $Therapeutic Activity: 8-22 mins                     Taft Worthing, PT, GCS 07/22/20,11:29 AM

## 2020-07-22 NOTE — Plan of Care (Signed)

## 2020-07-22 NOTE — Progress Notes (Signed)
Opened in error

## 2020-07-22 NOTE — TOC CAGE-AID Note (Signed)
Transition of Care [facility]) - CAGE-AID Screening   Patient Details  Name: Adrienne [name] MRN: [id number] Date of Birth: 08-20-1922  Transition of Care [facility]) CM/SW Contact:    Emeterio Reeve, [place] Phone Number: 07/22/2020, 1:57 PM   Clinical Narrative: Pt is unable to participate in assessment due to only being oriented to self.    CAGE-AID Screening: Substance Abuse Screening unable to be completed due to: : Patient unable to participate               [place] [place] Clinical Social Worker [phone]

## 2020-07-22 NOTE — Progress Notes (Signed)
Progress Note    Adrienne [name]  EGB:[id number] DOB: [birth year]  DOA: 07/19/2020 PCP: Virgie Dad, MD    Brief Narrative:     Medical records reviewed and are as summarized below:  Adrienne [name] is an 86 y.o. female from [place], pt with an unwitnessed fall 8/28. Normally ambulatory with a walker, staff assisted pt to a chair after fall, pt unable to stand d/t new onset low back pain.  Found to have b/l L5 transverse process.    Assessment/Plan:   Principal Problem:   Intractable pain Active Problems:   Rheumatoid arthritis (HCC)   Vascular dementia (HCC)   Hyperlipidemia   PAF (paroxysmal atrial fibrillation) (HCC)   Depression with anxiety   Fall   L5 vertebral fracture (HCC)   Chronic anticoagulation   Pain    L5 transverse process fracture with Intractable pain due to Fall -Lidoderm patch,Tylenol scheduled, PRN oxy and IV morphine for breakthrough-- due to continued pain will schedule robaxin as well -bowel regimen -no numbness or tingling in legs -PT/OT consult- SNF (will need to be pre-medicated with pain meds prior to PT evals) - Lumbar corset while up -if pain continues to be an issue, can discuss with orthopedics any other option other than conservative management  Doubt Bilateral lower extremity cellulitis -d/c abx for now and monitor  Question aspiration per CT -no signs of aspiration with speech eval  Depression with anxiety -Stable, home meds resumed  Hyperlipidemia -Stable, home meds held  PAF (paroxysmal atrial fibrillation) (HCC) -continue eliquis  Rheumatoid arthritis ([place]) -hold home meds for now  Vascular dementia ([place]) -resume home meds  Hypercalcemia -hold PO calcium replacement -improved with hydration    Family Communication/Anticipated D/C date and plan/Code Status   DVT prophylaxis: eliquis Code Status: Full Code- will need to verify Family Communication: spoke with nephew 8/29 Disposition  Plan: Status is: inpt    Dispo: The patient is from: ALF              Anticipated d/c is to: SNF              Anticipated d/c date is: 2 days              Patient currently is not medically stable to d/c.- pain control      Medical Consultants:    None.     Subjective:   Pain increases with movement  Objective:    Vitals:   07/21/20 1300 07/21/20 2013 07/22/20 0506 07/22/20 0749  BP: 139/71 (!) 160/76 (!) 162/77 (!) 174/76  Pulse: 88 75 67 72  Resp: 16 18 18 16   Temp: 98.5 F (36.9 C) 98.3 F (36.8 C) 97.6 F (36.4 C) 98.1 F (36.7 C)  TempSrc:  Oral Oral Oral  SpO2: 95% 92% 93% 94%  Weight:      Height:        Intake/Output Summary (Last 24 hours) at 07/22/2020 1252 Last data filed at 07/22/2020 0900 Gross per 24 hour  Intake 240 ml  Output 2450 ml  Net -2210 ml   Filed Weights   07/19/20 2206  Weight: 70.1 kg    Exam:   General: Appearance:    Well developed, well nourished female in no acute distress     Lungs:      respirations unlabored  Heart:    Normal heart rate  MS:   All extremities are intact.   Neurologic:   Awake, alert, pleasant and  cooperative     Data Reviewed:   I have personally reviewed following labs and imaging studies:  Labs: Labs show the following:   Basic Metabolic Panel: Recent Labs  Lab 07/19/20 1900 07/20/20 0104 07/21/20 0342  NA 139  --  140  K 3.9  --  4.1  CL 103  --  108  CO2 25  --  25  GLUCOSE 127*  --  102*  BUN 17  --  23  CREATININE 0.99  --  1.07*  CALCIUM 10.6*  --  9.2  MG  --  2.2  --    GFR Estimated Creatinine Clearance: 30.9 mL/min (A) (by C-G formula based on SCr of 1.07 mg/dL (H)). Liver Function Tests: No results for input(s): AST, ALT, ALKPHOS, BILITOT, PROT, ALBUMIN in the last 168 hours. No results for input(s): LIPASE, AMYLASE in the last 168 hours. No results for input(s): AMMONIA in the last 168 hours. Coagulation profile No results for input(s): INR, PROTIME in the  last 168 hours.  CBC: Recent Labs  Lab 07/19/20 1900 07/21/20 0342  WBC 11.5* 8.0  NEUTROABS 9.4*  --   HGB 12.5 11.2*  HCT 39.3 34.7*  MCV 94.9 97.2  PLT 176 153   Cardiac Enzymes: No results for input(s): CKTOTAL, CKMB, CKMBINDEX, TROPONINI in the last 168 hours. BNP (last 3 results) No results for input(s): PROBNP in the last 8760 hours. CBG: No results for input(s): GLUCAP in the last 168 hours. D-Dimer: No results for input(s): DDIMER in the last 72 hours. Hgb A1c: No results for input(s): HGBA1C in the last 72 hours. Lipid Profile: No results for input(s): CHOL, HDL, LDLCALC, TRIG, CHOLHDL, LDLDIRECT in the last 72 hours. Thyroid function studies: No results for input(s): TSH, T4TOTAL, T3FREE, THYROIDAB in the last 72 hours.  Invalid input(s): FREET3 Anemia work up: No results for input(s): VITAMINB12, FOLATE, FERRITIN, TIBC, IRON, RETICCTPCT in the last 72 hours. Sepsis Labs: Recent Labs  Lab 07/19/20 1900 07/21/20 0342  WBC 11.5* 8.0    Microbiology Recent Results (from the past 240 hour(s))  SARS Coronavirus 2 by RT PCR (hospital order, performed in [facility] hospital lab) Nasopharyngeal Nasopharyngeal Swab     Status: None   Collection Time: 07/19/20  6:46 PM   Specimen: Nasopharyngeal Swab  Result Value Ref Range Status   SARS Coronavirus 2 NEGATIVE NEGATIVE Final    Comment: (NOTE) SARS-CoV-2 target nucleic acids are NOT DETECTED.  The SARS-CoV-2 RNA is generally detectable in upper and lower respiratory specimens during the acute phase of infection. The lowest concentration of SARS-CoV-2 viral copies this assay can detect is 250 copies / mL. A negative result does not preclude SARS-CoV-2 infection and should not be used as the sole basis for treatment or other patient management decisions.  A negative result may occur with improper specimen collection / handling, submission of specimen other than nasopharyngeal swab, presence of viral  mutation(s) within the areas targeted by this assay, and inadequate number of viral copies (<250 copies / mL). A negative result must be combined with clinical observations, patient history, and epidemiological information.  Fact Sheet for Patients:   StrictlyIdeas.no  Fact Sheet for Healthcare Providers: BankingDealers.co.za  This test is not yet approved or  cleared by the [place] FDA and has been authorized for detection and/or diagnosis of SARS-CoV-2 by FDA under an Emergency Use Authorization (EUA).  This EUA will remain in effect (meaning this test can be used) for the duration of the  COVID-19 declaration under Section 564(b)(1) of the Act, 21 U.S.C. section 360bbb-3(b)(1), unless the authorization is terminated or revoked sooner.  Performed at [facility], [place] [address], [place], [place] [address]     Procedures and diagnostic studies:  No results found.  Medications:   . acetaminophen  1,000 mg Oral TID  . apixaban  2.5 mg Oral BID  . busPIRone  5 mg Oral Daily  . docusate sodium  100 mg Oral BID  . folic acid  1 mg Oral Daily  . lidocaine  1 patch Transdermal Q24H  . memantine  10 mg Oral BID  . methocarbamol  500 mg Oral TID  . polyvinyl alcohol  1 drop Both Eyes BID  . prednisoLONE acetate  1 drop Right Eye Daily  . QUEtiapine  12.5 mg Oral QHS  . sertraline  50 mg Oral Daily  . sodium chloride  1 drop Both Eyes QID  . sodium chloride flush  3 mL Intravenous Q12H   Continuous Infusions: . sodium chloride       LOS: 2 days   Geradine Girt  [facility]   How to contact the [facility] Attending or Consulting provider [place] or covering provider during after hours [place], for this patient?  1. Check the care team in [facility] and look for a) attending/consulting TRH provider listed and b) the [facility] team listed 2. Log into [email or web address] and use [place]'s universal password to access. If you do not  have the password, please contact the hospital operator. 3. Locate the [facility] provider you are looking for under [facility] and page to a number that you can be directly reached. 4. If you still have difficulty reaching the provider, please page the [facility] (Director on Call) for the Hospitalists listed on amion for assistance.  07/22/2020, 12:52 PM

## 2020-07-23 DIAGNOSIS — S32051A Stable burst fracture of fifth lumbar vertebra, initial encounter for closed fracture: Secondary | ICD-10-CM

## 2020-07-23 DIAGNOSIS — W19XXXA Unspecified fall, initial encounter: Secondary | ICD-10-CM

## 2020-07-23 DIAGNOSIS — Z7901 Long term (current) use of anticoagulants: Secondary | ICD-10-CM

## 2020-07-23 DIAGNOSIS — F418 Other specified anxiety disorders: Secondary | ICD-10-CM

## 2020-07-23 MED ORDER — DICLOFENAC SODIUM 1 % EX GEL
2.0000 g | Freq: Four times a day (QID) | CUTANEOUS | Status: DC
  Administered 2020-07-23 – 2020-07-24 (×4): 2 g via TOPICAL
  Filled 2020-07-23: qty 100

## 2020-07-23 NOTE — Progress Notes (Signed)
Physical Therapy Treatment Patient Details Name: Adrienne [name] MRN: [id number] DOB: 08-21-1922 Today's Date: 07/23/2020    History of Present Illness This is a 86 year old resident of an assisted nursing center.  She presents after she had a mechanical fall yesterday.  Since then she has been unable to ambulate. Pt found to have sustaining L5 transverse process fx. PMH includes: RA, HLD, dementia.    PT Comments    Pt progressing slowly towards her physical therapy goals as pain in back and LLE remain a barrier. Requiring two person maximal assist for bed mobility. Once sitting edge of bed, pt heavily resistive due to increase in pain and requesting to lie back down. Returned to supine where pain seemed to subside. Placed pt at ~40 degrees bed elevation and pt able to participate in bed level BUE/BLE exercises. Will continue efforts.     Follow Up Recommendations  SNF;Supervision/Assistance - 24 hour     Equipment Recommendations  None recommended by PT    Recommendations for Other Services       Precautions / Restrictions Precautions Precautions: Fall;Back Precaution Booklet Issued: No Precaution Comments: back for comfort Required Braces or Orthoses: Spinal Brace Spinal Brace: Lumbar corset;Applied in sitting position Restrictions Weight Bearing Restrictions: No    Mobility  Bed Mobility Overal bed mobility: Needs Assistance Bed Mobility: Supine to Sit;Sit to Supine     Supine to sit: Max assist;+2 for physical assistance Sit to supine: Max assist;+2 for physical assistance   General bed mobility comments: MaxA + 2 for progression to sitting, assist for BLE initiation to edge of bed, trunk to upright. Pt somewhat resistive. Assist for trunk guidance back to supine and BLE elevation back into bed  Transfers                 General transfer comment: pt unable  Ambulation/Gait                 Stairs             Wheelchair Mobility     Modified Rankin (Stroke Patients Only)       Balance Overall balance assessment: Needs assistance Sitting-balance support: Feet supported;Bilateral upper extremity supported Sitting balance-Leahy Scale: Poor Sitting balance - Comments: Pt requiring up to maxA, heavily resistive, often pushing back into therapist Postural control: Posterior lean                                  Cognition Arousal/Alertness: Awake/alert Behavior During Therapy: WFL for tasks assessed/performed Overall Cognitive Status: History of cognitive impairments - at baseline                                 General Comments: Pt pleasantly confused      Exercises General Exercises - Lower Extremity Short Arc Quad: AROM;AAROM;Both;10 reps;Supine Heel Slides: Both;5 reps;Seated Straight Leg Raises: Right;20 reps;Supine    General Comments        Pertinent Vitals/Pain Pain Assessment: Faces Faces Pain Scale: Hurts worst Pain Location: back/LLE with movement Pain Descriptors / Indicators: Grimacing;Guarding;Discomfort;Sharp;Moaning Pain Intervention(s): Limited activity within patient's tolerance;Monitored during session;Premedicated before session;Repositioned    Home Living                      Prior Function            PT  Goals (current goals can now be found in the care plan section) Acute Rehab PT Goals Patient Stated Goal: "do more exercises." PT Goal Formulation: Patient unable to participate in goal setting Time For Goal Achievement: 08/03/20 Progress towards PT goals: Not progressing toward goals - comment (limited by pain)    Frequency    Min 3X/week      PT Plan Current plan remains appropriate    Co-evaluation PT/OT/SLP Co-Evaluation/Treatment: Yes Reason for Co-Treatment: Necessary to address cognition/behavior during functional activity;For patient/therapist safety;To address functional/ADL transfers;Other (comment) (poor  activity/pain tolerance) PT goals addressed during session: Mobility/safety with mobility;Balance;Strengthening/ROM        AM-PAC PT "6 Clicks" Mobility   Outcome Measure  Help needed turning from your back to your side while in a flat bed without using bedrails?: Total Help needed moving from lying on your back to sitting on the side of a flat bed without using bedrails?: Total Help needed moving to and from a bed to a chair (including a wheelchair)?: Total Help needed standing up from a chair using your arms (e.g., wheelchair or bedside chair)?: Total Help needed to walk in hospital room?: Total Help needed climbing 3-5 steps with a railing? : Total 6 Click Score: 6    End of Session   Activity Tolerance: Patient limited by pain Patient left: in bed;with call bell/phone within reach;with bed alarm set Nurse Communication: Mobility status PT Visit Diagnosis: Other abnormalities of gait and mobility (R26.89);Pain     Time: 2956-2130 PT Time Calculation (min) (ACUTE ONLY): 30 min  Charges:  $Therapeutic Activity: 8-22 mins                       Wyona Almas, PT, DPT Acute Rehabilitation Services Pager [phone] Office [phone]    Deno Etienne 07/23/2020, 4:06 PM

## 2020-07-23 NOTE — Progress Notes (Signed)
Occupational Therapy Treatment Patient Details Name: Adrienne [name] MRN: [id number] DOB: [birth year] Today's Date: 07/23/2020    History of present illness This is a 86 year old resident of an assisted nursing center.  She presents after she had a mechanical fall yesterday.  Since then she has been unable to ambulate. Pt found to have sustaining L5 transverse process fx. PMH includes: RA, HLD, dementia.   OT comments  Pt seen for OT follow up session with focus on BADL mobility progression. Pt required max A +2 for bed mobility and was only able to stay seated EOB ~3-5 minutes. Pt highly resistive and pushing back into therapist, crying out in pain and pleading to lay back down. Attempted multiple forms of distraction with little success. Attempted to place pt in chair position in bed with similar reaction. She was able to maintain partial chair position to rub lotion on UB/LB. Continues to be limited by pain. D/c recs appropriate, will continue to follow.   Follow Up Recommendations  SNF    Equipment Recommendations  Wheelchair (measurements OT);Wheelchair cushion (measurements OT);Hospital bed    Recommendations for Other Services      Precautions / Restrictions Precautions Precautions: Fall;Back Precaution Booklet Issued: No Precaution Comments: back for comfort Required Braces or Orthoses: Spinal Brace Spinal Brace: Lumbar corset;Applied in sitting position Restrictions Weight Bearing Restrictions: No       Mobility Bed Mobility Overal bed mobility: Needs Assistance Bed Mobility: Supine to Sit;Sit to Supine     Supine to sit: Max assist;+2 for physical assistance Sit to supine: Max assist;+2 for physical assistance   General bed mobility comments: MaxA + 2 for progression to sitting, assist for BLE initiation to edge of bed, trunk to upright. Pt somewhat resistive. Assist for trunk guidance back to supine and BLE elevation back into bed  Transfers                  General transfer comment: pt unable    Balance Overall balance assessment: Needs assistance Sitting-balance support: Feet supported;Bilateral upper extremity supported Sitting balance-Leahy Scale: Poor Sitting balance - Comments: Pt requiring up to maxA, heavily resistive, often pushing back into therapist Postural control: Posterior lean                                 ADL either performed or assessed with clinical judgement   ADL Overall ADL's : Needs assistance/impaired Eating/Feeding: Set up;Bed level   Grooming: Set up;Bed level Grooming Details (indicate cue type and reason): unable to release UE to perform task while sitting EOB due to pain, completed after return to supine  Upper Body Bathing: Minimal assistance;Bed level Upper Body Bathing Details (indicate cue type and reason): sinmulated with lotion application on UB; assist to reach back and for more dynamic reach due to pain Lower Body Bathing: Maximal assistance;Bed level Lower Body Bathing Details (indicate cue type and reason): simulated with lotion application. Pt able to rub lotion on tops of thighs, but without moving trunk or reaching out of supported bed level position (partial chair position)                       General ADL Comments: pt continues to be limited by back pain with OOB mobility. Able to tolerate EOB briefly but very resistive and fighting therapist to lay back down     Vision Patient Visual Report: No change from baseline Additional  Comments: continuously closing R eye throughout session   Perception     Praxis      Cognition Arousal/Alertness: Awake/alert Behavior During Therapy: WFL for tasks assessed/performed Overall Cognitive Status: History of cognitive impairments - at baseline Area of Impairment: Memory;Safety/judgement;Problem solving                     Memory: Decreased short-term memory;Decreased recall of precautions   Safety/Judgement:  Decreased awareness of deficits;Decreased awareness of safety   Problem Solving: Slow processing;Requires verbal cues;Requires tactile cues General Comments: hx of dementia at baseline- mostly pleasantly confused until experiencing pain.        Exercises Shoulder Flexion AROM x10 in bed   Shoulder Instructions       General Comments      Pertinent Vitals/ Pain       Pain Assessment: Faces Faces Pain Scale: Hurts worst Pain Location: back/LLE with movement Pain Descriptors / Indicators: Grimacing;Guarding;Discomfort;Sharp;Moaning Pain Intervention(s): Limited activity within patient's tolerance;Monitored during session;Premedicated before session;Repositioned  Home Living                                          Prior Functioning/Environment              Frequency  Min 2X/week        Progress Toward Goals  OT Goals(current goals can now be found in the care plan section)  Progress towards OT goals: Progressing toward goals  Acute Rehab OT Goals Patient Stated Goal: "do more exercises." OT Goal Formulation: With patient Time For Goal Achievement: 08/03/20 Potential to Achieve Goals: [place] Discharge plan remains appropriate    Co-evaluation      Reason for Co-Treatment: Necessary to address cognition/behavior during functional activity;For patient/therapist safety;To address functional/ADL transfers PT goals addressed during session: Mobility/safety with mobility;Balance;Strengthening/ROM OT goals addressed during session: ADL's and self-care;Strengthening/ROM      AM-PAC OT "6 Clicks" Daily Activity     Outcome Measure   Help from another person eating meals?: A Little Help from another person taking care of personal grooming?: A Little Help from another person toileting, which includes using toliet, bedpan, or urinal?: Total Help from another person bathing (including washing, rinsing, drying)?: A Lot Help from another person to  put on and taking off regular upper body clothing?: A Lot Help from another person to put on and taking off regular lower body clothing?: Total 6 Click Score: 12    End of Session    OT Visit Diagnosis: Other abnormalities of gait and mobility (R26.89);Pain;Other symptoms and signs involving cognitive function Pain - part of body:  (back)   Activity Tolerance Patient limited by pain   Patient Left in bed;with call bell/phone within reach;with bed alarm set   Nurse Communication Mobility status        Time: 1459-1530 OT Time Calculation (min): 31 min  Charges: OT General Charges $OT Visit: 1 Visit OT Treatments $Self Care/Home Management : 8-22 mins  Zenovia Jarred, MSOT, OTR/L [facility] Office Number: [phone] Pager: [phone]  Zenovia Jarred 07/23/2020, 5:52 PM

## 2020-07-23 NOTE — Progress Notes (Signed)
PROGRESS NOTE    Adrienne [name]  NLZ:[id number] DOB: 14-Jan-1922 DOA: 07/19/2020 PCP: Virgie Dad, MD    Brief Narrative:  Patient admitted to the hospital with the working diagnosis of acute fracture of L5 vertebral bilateral transverse processes .  86 year old female, nursing home resident, who sustained a mechanical fall 2020 4 hours prior to hospitalization.  She showed severe pain that prevented her from ambulating.  On her initial physical examination blood pressure 179/79, heart rate 83, respirate 15, temperature 98, oxygen saturation 90%, she was awake and alert, her lungs are clear to auscultation bilaterally, heart S1-S2, present rhythm, soft abdomen, no lower extremity edema.  Lumbar CT scan showed nondisplaced fractures of the L5 transverse process bilaterally.  Patient has been admitted to the medical ward, she was placed on analgesics and DVT prophylaxis.  Physical therapy/occupational therapy have recommended skilled nursing facility at discharge to continue her recovery.  Assessment & Plan:   Principal Problem:   Intractable pain Active Problems:   Rheumatoid arthritis (HCC)   Vascular dementia (HCC)   Hyperlipidemia   PAF (paroxysmal atrial fibrillation) (HCC)   Depression with anxiety   Fall   L5 vertebral fracture (HCC)   Chronic anticoagulation   Pain   1. Acute L5 transverse process fracture bilaterally. Patient continue to have pain and limited mobility.   Continue pain control with scheduled acetaminophen 1000 mg tid, prn methocarbamol, oxycodone and morphine. Will add topical diclofenac to L5 region,   Follow with physical and occupational therapy recommendations, patient will need SNF.   2. Depression and anxiety/ vascular dementia. Continue with buspirone, memantine, sertraline and quetiapine.   3. Dyslipidemia. Continue with statin therapy.  4. Paroxysmal atrial fibrillation. Rate controlled, continue anticoagulation with apixaban.   5.  Hypercalcemia. Continue to hold on ca supplementation.     Status is: Inpatient  Remains inpatient appropriate because:Inpatient level of care appropriate due to severity of illness   Dispo: The patient is from: Home              Anticipated d/c is to: SNF              Anticipated d/c date is: 1 day              Patient currently is not medically stable to d/c.    DVT prophylaxis:  apixaban   Code Status:    full  Family Communication:  I spoke with patient's nephew at the bedside, we talked in detail about patient's condition, plan of care and prognosis and all questions were addressed.     Subjective: Continue to have back pain, worse with movement, no radiation, no nausea or vomiting,   Objective: Vitals:   07/22/20 1503 07/22/20 2033 07/23/20 0421 07/23/20 0736  BP: 125/61 123/84 (!) 150/89 (!) 146/74  Pulse: 71 83 82 72  Resp: 17  17 16   Temp: 98.1 F (36.7 C) 98.3 F (36.8 C) 97.8 F (36.6 C) 98.5 F (36.9 C)  TempSrc: Oral Oral    SpO2: 93% 92% 96% 96%  Weight:      Height:        Intake/Output Summary (Last 24 hours) at 07/23/2020 1337 Last data filed at 07/23/2020 0900 Gross per 24 hour  Intake 1080 ml  Output 500 ml  Net 580 ml   Filed Weights   07/19/20 2206  Weight: 70.1 kg    Examination:   General: Not in pain or dyspnea, deconditioned  Neurology: Awake and alert, non  focal  E ENT: no pallor, no icterus, oral mucosa moist Cardiovascular: No JVD. S1-S2 present, rhythmic, no gallops, rubs, or murmurs. No lower extremity edema. Pulmonary: positive breath sounds bilaterally, adequate air movement, no wheezing, rhonchi or rales. Gastrointestinal. Abdomen soft and non tender Skin. No rashes Musculoskeletal: no joint deformities     Data Reviewed: I have personally reviewed following labs and imaging studies  CBC: Recent Labs  Lab 07/19/20 1900 07/21/20 0342  WBC 11.5* 8.0  NEUTROABS 9.4*  --   HGB 12.5 11.2*  HCT 39.3 34.7*  MCV 94.9  97.2  PLT 176 177   Basic Metabolic Panel: Recent Labs  Lab 07/19/20 1900 07/20/20 0104 07/21/20 0342  NA 139  --  140  K 3.9  --  4.1  CL 103  --  108  CO2 25  --  25  GLUCOSE 127*  --  102*  BUN 17  --  23  CREATININE 0.99  --  1.07*  CALCIUM 10.6*  --  9.2  MG  --  2.2  --    GFR: Estimated Creatinine Clearance: 30.9 mL/min (A) (by C-G formula based on SCr of 1.07 mg/dL (H)). Liver Function Tests: No results for input(s): AST, ALT, ALKPHOS, BILITOT, PROT, ALBUMIN in the last 168 hours. No results for input(s): LIPASE, AMYLASE in the last 168 hours. No results for input(s): AMMONIA in the last 168 hours. Coagulation Profile: No results for input(s): INR, PROTIME in the last 168 hours. Cardiac Enzymes: No results for input(s): CKTOTAL, CKMB, CKMBINDEX, TROPONINI in the last 168 hours. BNP (last 3 results) No results for input(s): PROBNP in the last 8760 hours. HbA1C: No results for input(s): HGBA1C in the last 72 hours. CBG: No results for input(s): GLUCAP in the last 168 hours. Lipid Profile: No results for input(s): CHOL, HDL, LDLCALC, TRIG, CHOLHDL, LDLDIRECT in the last 72 hours. Thyroid Function Tests: No results for input(s): TSH, T4TOTAL, FREET4, T3FREE, THYROIDAB in the last 72 hours. Anemia Panel: No results for input(s): VITAMINB12, FOLATE, FERRITIN, TIBC, IRON, RETICCTPCT in the last 72 hours.    Radiology Studies: I have reviewed all of the imaging during this hospital visit personally     Scheduled Meds: . acetaminophen  1,000 mg Oral TID  . apixaban  2.5 mg Oral BID  . busPIRone  5 mg Oral Daily  . docusate sodium  100 mg Oral BID  . folic acid  1 mg Oral Daily  . lidocaine  1 patch Transdermal Q24H  . memantine  10 mg Oral BID  . methocarbamol  500 mg Oral TID  . polyvinyl alcohol  1 drop Both Eyes BID  . prednisoLONE acetate  1 drop Right Eye Daily  . QUEtiapine  12.5 mg Oral QHS  . sertraline  50 mg Oral Daily  . sodium chloride  1 drop  Both Eyes QID  . sodium chloride flush  3 mL Intravenous Q12H   Continuous Infusions: . sodium chloride       LOS: 3 days        Margrette Wynia Gerome Apley, MD

## 2020-07-23 NOTE — Plan of Care (Signed)

## 2020-07-24 DIAGNOSIS — I480 Paroxysmal atrial fibrillation: Secondary | ICD-10-CM

## 2020-07-24 LAB — SARS CORONAVIRUS 2 BY RT PCR (HOSPITAL ORDER, PERFORMED IN ~~LOC~~ HOSPITAL LAB): SARS Coronavirus 2: NEGATIVE

## 2020-07-24 MED ORDER — ACETAMINOPHEN 500 MG PO TABS: 500.0000 mg | ORAL_TABLET | Freq: Four times a day (QID) | ORAL | 0 refills | Status: DC | PRN

## 2020-07-24 MED ORDER — OXYCODONE HCL 5 MG PO TABS
5.0000 mg | ORAL_TABLET | Freq: Four times a day (QID) | ORAL | 0 refills | Status: DC | PRN
Start: 2020-07-24 — End: 2020-07-25

## 2020-07-24 MED ORDER — METHOCARBAMOL 500 MG PO TABS
500.0000 mg | ORAL_TABLET | Freq: Three times a day (TID) | ORAL | 0 refills | Status: DC | PRN
Start: 2020-07-24 — End: 2020-10-09

## 2020-07-24 MED ORDER — DICLOFENAC SODIUM 1 % EX GEL: 2.0000 g | Freq: Four times a day (QID) | CUTANEOUS | 0 refills | Status: DC

## 2020-07-24 NOTE — TOC Transition Note (Signed)
Transition of Care [facility]) - CM/SW Discharge Note   Patient Details  Name: Adrienne [name] MRN: [id number] Date of Birth: 1922/07/23  Transition of Care [facility]) CM/SW Contact:  Sharin Mons, RN Phone Number:  [phone] 07/24/2020, 12:23 PM   Clinical Narrative:    Patient will DC to: [facility] Anticipated DC date: 07/24/2020 Family notified: Caren Griffins ( niece) Transport by: Corey Harold   Per MD patient ready for DC today . RN, patient, patient's family, and facility notified of DC. Discharge Summary and FL2 sent to facility. RN to call report prior to discharge [phone]). DC packet on chart. Ambulance transport will be requested for patient once updated COVID results.   RNCM will sign off for now as intervention is no longer needed. Please consult [place] again if new needs arise.    Final next level of care: [facility] ([place]) Barriers to Discharge: No Barriers Identified   Patient Goals and CMS Choice   CMS Medicare.gov Compare Post Acute Care list provided to:: Patient    Discharge Placement                       Discharge Plan and Services                                     Social Determinants of Health (SDOH) Interventions     Readmission Risk Interventions No flowsheet data found.

## 2020-07-24 NOTE — Progress Notes (Signed)
[place] at [phone] x2. unable to talked to nurse. Left me messages to call back for report.

## 2020-07-24 NOTE — Discharge Summary (Signed)
Physician Discharge Summary  Yuri Flener Muha JGG:[id number] DOB: 23-Oct-1922 DOA: 07/19/2020  PCP: Virgie Dad, MD  Admit date: 07/19/2020 Discharge date: 07/24/2020  Admitted From: SNF  Disposition:   SNF  Recommendations for Outpatient Follow-up and new medication changes:  1. Follow up with Dr. Lyndel Safe in 7 days. 2. Continue pain control with acetaminophen, topical diclofenac, as needed methocarbamol and oxycodone.  3. Holding calcium supplementation for now, please follow on calcium levels in 7 days.   Home Health: na   Equipment/Devices: na   Discharge Condition: stable CODE STATUS: full  Diet recommendation:  Heart healthy   Brief/Interim Summary: Patient admitted to the hospital withtheworking diagnosis of acute fracture of L5 vertebral bilateral transverse processes .  86 year old female, nursing home resident, who sustained a mechanical fall 24 hours prior to hospitalization. She showed severe pain that prevented her from ambulating. On her initial physical examination blood pressure 179/79, heart rate 83, respiratory rate 15, temperature 98, oxygen saturation 90%, she was awake and alert, her lungs were clear to auscultation bilaterally, heart S1-S2, present rhythm, soft abdomen, no lower extremity edema. Lumbar CT scan showed nondisplaced fractures of the L5 transverse process bilaterally. Head and cervical spine CT no acute changes. Sodium 139, potassium 3.9, chloride 103, bicarb 25, glucose 127, BUN 17, creatinine 0.99, white count 11.5, hemoglobin 12.5, hematocrit 39.3, platelets 176.  SARS COVID-19 was negative.  Chest radiograph with no infiltrates, positive for ascending aortic calcification.  Patient was admitted to the medical ward, she was placed on analgesics and DVT prophylaxis.  Physical therapy/occupational therapy have recommended skilled nursing facility at discharge to continue her recovery.  1.  Acute L5 transverse process fracture bilaterally/   osteoporosis.  Patient received analgesics with acetaminophen, methocarbamol, oxycodone, morphine and topical diclofenac.  Pain improved, patient was seen by physical therapy, recommendations to continue care at the skilled nursing facility.  Continue with denosumab.   2.  Depression/anxiety, vascular dementia.  Mild confusion but not agitation, continue buspirone, memantine, sertraline and quetiapine.  3.  Paroxysmal atrial fibrillation.  Patient continue rate control, continue anticoagulation with apixaban.  4.  Dyslipidemia.  Continue with statin therapy.  5.  Mild hypercalcemia.  Calcium supplementation was discontinued. Follow calcium levels in 7 days.   Discharge Diagnoses:  Principal Problem:   Intractable pain Active Problems:   Rheumatoid arthritis ([place])   Vascular dementia ([place])   Hyperlipidemia   PAF (paroxysmal atrial fibrillation) ([place])   Depression with anxiety   Fall   L5 vertebral fracture ([facility])   Chronic anticoagulation   Pain    Discharge Instructions   Allergies as of 07/24/2020      Reactions   Novocain [procaine] Other (See Comments)   Unknown reaction   Sulfamethoxazole-trimethoprim Other (See Comments)   Unknown reaction   Tape Other (See Comments)   Adhesive tape - unknown reaction   Codeine Other (See Comments)   Unknown reaction   Neosporin [neomycin-bacitracin Zn-polymyx] Other (See Comments)   Unknown reaction      Medication List    STOP taking these medications   Calcium 1200 1200-1000 MG-UNIT Chew     TAKE these medications   acetaminophen 500 MG tablet Commonly known as: TYLENOL Take 1 tablet (500 mg total) by mouth every 6 (six) hours as needed for moderate pain.   busPIRone 5 MG tablet Commonly known as: BUSPAR Take 5 mg by mouth daily.   denosumab 60 MG/ML Sosy injection Commonly known as: PROLIA Inject 60 mg into the  skin every 6 (six) months. On the 22nd of reach 6th month   diclofenac Sodium 1 % Gel Commonly known  as: VOLTAREN Apply 2 g topically 4 (four) times daily. Apply to lower back   Eliquis 2.5 MG Tabs tablet Generic drug: apixaban Take 2.5 mg by mouth 2 (two) times daily.   fluorouracil 5 % cream Commonly known as: EFUDEX Apply 1 application topically See admin instructions. Apply peasized amount topically to right temple, nose and right upper lip twice daily for 3 weeks   folic acid 1 MG tablet Commonly known as: FOLVITE Take 1 mg by mouth daily.   memantine 10 MG tablet Commonly known as: NAMENDA Take 10 mg by mouth 2 (two) times daily.   methocarbamol 500 MG tablet Commonly known as: ROBAXIN Take 1 tablet (500 mg total) by mouth every 8 (eight) hours as needed for muscle spasms.   methotrexate 2.5 MG tablet Commonly known as: RHEUMATREX Take 15 mg by mouth every Friday. Caution:Chemotherapy. Protect from light.   mineral oil-hydrophilic petrolatum ointment Apply 1 application topically 2 (two) times daily as needed for dry skin. Apply to forehead, legs, and right mid back   oxyCODONE 5 MG immediate release tablet Commonly known as: Oxy IR/ROXICODONE Take 1 tablet (5 mg total) by mouth every 6 (six) hours as needed for severe pain.   pravastatin 20 MG tablet Commonly known as: PRAVACHOL Take 20 mg by mouth at bedtime.   prednisoLONE acetate 1 % ophthalmic suspension Commonly known as: PRED FORTE Place 1 drop into the right eye daily.   QUEtiapine 25 MG tablet Commonly known as: SEROQUEL Take 12.5 mg by mouth 2 (two) times daily.   sertraline 50 MG tablet Commonly known as: ZOLOFT Take 50 mg by mouth daily.   sodium chloride 5 % ophthalmic solution Commonly known as: MURO 128 Place 1 drop into both eyes 4 (four) times daily.   Systane 0.4-0.3 % Soln Generic drug: Polyethyl Glycol-Propyl Glycol Place 1 drop into both eyes in the morning and at bedtime.       Allergies  Allergen Reactions  . Novocain [Procaine] Other (See Comments)    Unknown reaction  .  Sulfamethoxazole-Trimethoprim Other (See Comments)    Unknown reaction  . Tape Other (See Comments)    Adhesive tape - unknown reaction  . Codeine Other (See Comments)    Unknown reaction  . Neosporin [Neomycin-Bacitracin Zn-Polymyx] Other (See Comments)    Unknown reaction       Procedures/Studies: DG Chest 1 View  Result Date: 07/19/2020 CLINICAL DATA:  Status post fall. EXAM: CHEST  1 VIEW COMPARISON:  November 26, 2018 FINDINGS: Mild, chronic appearing diffusely increased lung markings are seen. There is no evidence of acute infiltrate, pleural effusion or pneumothorax. The heart size and mediastinal contours are within normal limits. There is marked severity calcification of the thoracic aorta. There is a chronic fracture deformity of the sixth right rib. An additional chronic deformity of the mid left clavicle is seen. Degenerative changes are noted throughout the thoracic spine. IMPRESSION: Chronic appearing interstitial lung disease without evidence of acute or active cardiopulmonary disease. Electronically Signed   By: Virgina Norfolk M.D.   On: 07/19/2020 16:28   DG Lumbar Spine 2-3 Views  Result Date: 07/19/2020 CLINICAL DATA:  Unwitnessed fall.  Low back pain. Unable to stand. EXAM: LUMBAR SPINE - 2-3 VIEW COMPARISON:  None. FINDINGS: No evidence of acute fracture. Vertebral body heights are preserved. There is 5 mm anterolisthesis of L4  on L5 that is likely degenerative. Disc space narrowing and endplate spurring most prominent at L4-L5 and L5-S1. bones are diffusely under mineralized. Multilevel facet hypertrophy. The sacroiliac joints are congruent with mild degenerative change. Aortic atherosclerosis. IMPRESSION: 1. Multilevel degenerative disc disease and facet hypertrophy in the lumbar spine. No evidence of acute fracture. 2. Grade 1 anterolisthesis of L4 on L5, likely degenerative. Electronically Signed   By: Keith Rake M.D.   On: 07/19/2020 16:31   DG Pelvis 1-2  Views  Result Date: 07/19/2020 CLINICAL DATA:  Unwitnessed fall.  Low back pain.  Unable to stand. EXAM: PELVIS - 1-2 VIEW COMPARISON:  None. FINDINGS: The cortical margins of the bony pelvis are intact. No fracture. Pubic symphysis and sacroiliac joints are congruent. Portions of the pubic rami are partially obscured by overlying stool/bowel gas. Both femoral heads are well-seated in the respective acetabula. Moderate bilateral hip osteoarthritis with joint space narrowing. IMPRESSION: 1. No pelvic fracture. 2. Moderate bilateral hip osteoarthritis. Electronically Signed   By: Keith Rake M.D.   On: 07/19/2020 16:29   CT Head Wo Contrast  Result Date: 07/19/2020 CLINICAL DATA:  Unwitnessed fall.  On Eliquis. EXAM: CT HEAD WITHOUT CONTRAST CT CERVICAL SPINE WITHOUT CONTRAST TECHNIQUE: Multidetector CT imaging of the head and cervical spine was performed following the standard protocol without intravenous contrast. Multiplanar CT image reconstructions of the cervical spine were also generated. COMPARISON:  MRI brain dated May 28, 2017. CT head and cervical spine dated March 11, 2016. FINDINGS: CT HEAD FINDINGS Brain: No evidence of acute infarction, hemorrhage, hydrocephalus, extra-axial collection or mass lesion/mass effect. Stable atrophy and chronic microvascular ischemic changes. Slightly asymmetric hyperdensity near the left hippocampus and temporal horn is favored to represent choroid plexus. Vascular: Atherosclerotic vascular calcification of the carotid siphons. No hyperdense vessel. Skull: Normal. Negative for fracture or focal lesion. Sinuses/Orbits: Small air-fluid level in the right sphenoid sinus. Frothy secretions a left posterior ethmoid air cell. The orbits are unremarkable. Other: None. CT CERVICAL SPINE FINDINGS Alignment: No traumatic malalignment. Unchanged trace anterolisthesis at C4-C5 and C7-T1. Skull base and vertebrae: No acute fracture. No primary bone lesion or focal pathologic  process. Soft tissues and spinal canal: No prevertebral fluid or swelling. No visible canal hematoma. Disc levels: Multilevel disc height loss, severe at C4-C5 and C5-C6. Moderate to severe facet uncovertebral hypertrophy throughout the cervical spine. Findings are similar to prior study Upper chest: Biapical pleuroparenchymal scarring. Other: Secretions in the trachea. IMPRESSION: 1. No acute intracranial abnormality. Stable atrophy and chronic microvascular ischemic changes. 2. No acute cervical spine fracture or traumatic malalignment. 3. Secretions in the trachea. Correlate for aspiration. Electronically Signed   By: Titus Dubin M.D.   On: 07/19/2020 16:17   CT Cervical Spine Wo Contrast  Result Date: 07/19/2020 CLINICAL DATA:  Unwitnessed fall.  On Eliquis. EXAM: CT HEAD WITHOUT CONTRAST CT CERVICAL SPINE WITHOUT CONTRAST TECHNIQUE: Multidetector CT imaging of the head and cervical spine was performed following the standard protocol without intravenous contrast. Multiplanar CT image reconstructions of the cervical spine were also generated. COMPARISON:  MRI brain dated May 28, 2017. CT head and cervical spine dated March 11, 2016. FINDINGS: CT HEAD FINDINGS Brain: No evidence of acute infarction, hemorrhage, hydrocephalus, extra-axial collection or mass lesion/mass effect. Stable atrophy and chronic microvascular ischemic changes. Slightly asymmetric hyperdensity near the left hippocampus and temporal horn is favored to represent choroid plexus. Vascular: Atherosclerotic vascular calcification of the carotid siphons. No hyperdense vessel. Skull: Normal. Negative for fracture  or focal lesion. Sinuses/Orbits: Small air-fluid level in the right sphenoid sinus. Frothy secretions a left posterior ethmoid air cell. The orbits are unremarkable. Other: None. CT CERVICAL SPINE FINDINGS Alignment: No traumatic malalignment. Unchanged trace anterolisthesis at C4-C5 and C7-T1. Skull base and vertebrae: No acute  fracture. No primary bone lesion or focal pathologic process. Soft tissues and spinal canal: No prevertebral fluid or swelling. No visible canal hematoma. Disc levels: Multilevel disc height loss, severe at C4-C5 and C5-C6. Moderate to severe facet uncovertebral hypertrophy throughout the cervical spine. Findings are similar to prior study Upper chest: Biapical pleuroparenchymal scarring. Other: Secretions in the trachea. IMPRESSION: 1. No acute intracranial abnormality. Stable atrophy and chronic microvascular ischemic changes. 2. No acute cervical spine fracture or traumatic malalignment. 3. Secretions in the trachea. Correlate for aspiration. Electronically Signed   By: Titus Dubin M.D.   On: 07/19/2020 16:17   CT Lumbar Spine Wo Contrast  Result Date: 07/19/2020 CLINICAL DATA:  Fall.  Back pain. EXAM: CT LUMBAR SPINE WITHOUT CONTRAST TECHNIQUE: Multidetector CT imaging of the lumbar spine was performed without intravenous contrast administration. Multiplanar CT image reconstructions were also generated. COMPARISON:  Lumbar radiographs 07/19/2020 FINDINGS: Segmentation: Normal Alignment: Mild anterolisthesis L2-3, L3-4, L4-5 Vertebrae: Nondisplaced acute fractures of the L5 transverse process bilaterally. No vertebral body fracture. Paraspinal and other soft tissues: Atherosclerotic calcification aorta and iliac arteries without aneurysm. No paraspinous mass or adenopathy. Right renal cyst. Disc levels: T11-12: Moderate disc degeneration and spurring. Moderate foraminal narrowing bilaterally due to spurring T12-L1: Mild disc degeneration.  Negative for stenosis. L1-2: Disc degeneration and mild spurring. No significant stenosis. Mild facet degeneration. L2-3: Disc degeneration with diffuse endplate spurring. Bilateral facet degeneration. Mild spinal stenosis. Moderate subarticular stenosis on the left and mild subarticular stenosis on the right. L3-4: Disc degeneration with disc bulging. Bilateral facet  hypertrophy. Moderate subarticular stenosis on the right. Spinal canal adequate in size L4-5: Broad-based central disc protrusion with diffuse endplate spurring and bilateral facet degeneration. Moderate subarticular stenosis bilaterally L5-S1: Mild disc and mild facet degeneration. No significant stenosis. IMPRESSION: Nondisplaced fractures of the L5 transverse process bilaterally. These appear acute. No fracture of the vertebral body. Multilevel degenerative change throughout the lumbar spine as above. Electronically Signed   By: Franchot Gallo M.D.   On: 07/19/2020 20:12      Subjective: Patient with improved back pain, no nausea or vomiting, no chest pain or dyspnea.   Discharge Exam: Vitals:   07/23/20 2001 07/24/20 0321  BP: (!) 147/72 (!) 145/55  Pulse: 83 69  Resp: 18 19  Temp: 98.4 F (36.9 C) 97.8 F (36.6 C)  SpO2: 95% 94%   Vitals:   07/23/20 0736 07/23/20 1500 07/23/20 2001 07/24/20 0321  BP: (!) 146/74 (!) 144/70 (!) 147/72 (!) 145/55  Pulse: 72 78 83 69  Resp: 16 17 18 19   Temp: 98.5 F (36.9 C) 98.4 F (36.9 C) 98.4 F (36.9 C) 97.8 F (36.6 C)  TempSrc:  Oral Oral Oral  SpO2: 96% 98% 95% 94%  Weight:      Height:        General: Not in pain or dyspnea,  Neurology: Awake and alert, non focal. Mild confusion, not agitation.  E ENT: no pallor, no icterus, oral mucosa moist Cardiovascular: No JVD. S1-S2 present, rhythmic, no gallops, rubs, or murmurs. No lower extremity edema. Pulmonary: positive breath sounds bilaterally, adequate air movement, no wheezing, rhonchi or rales. Gastrointestinal. Abdomen soft and non tender Skin. No rashes Musculoskeletal:  no joint deformities   The results of significant diagnostics from this hospitalization (including imaging, microbiology, ancillary and laboratory) are listed below for reference.     Microbiology: Recent Results (from the past 240 hour(s))  SARS Coronavirus 2 by RT PCR (hospital order, performed in [facility] hospital lab) Nasopharyngeal Nasopharyngeal Swab     Status: None   Collection Time: 07/19/20  6:46 PM   Specimen: Nasopharyngeal Swab  Result Value Ref Range Status   SARS Coronavirus 2 NEGATIVE NEGATIVE Final    Comment: (NOTE) SARS-CoV-2 target nucleic acids are NOT DETECTED.  The SARS-CoV-2 RNA is generally detectable in upper and lower respiratory specimens during the acute phase of infection. The lowest concentration of SARS-CoV-2 viral copies this assay can detect is 250 copies / mL. A negative result does not preclude SARS-CoV-2 infection and should not be used as the sole basis for treatment or other patient management decisions.  A negative result may occur with improper specimen collection / handling, submission of specimen other than nasopharyngeal swab, presence of viral mutation(s) within the areas targeted by this assay, and inadequate number of viral copies (<250 copies / mL). A negative result must be combined with clinical observations, patient history, and epidemiological information.  Fact Sheet for Patients:   StrictlyIdeas.no  Fact Sheet for Healthcare Providers: BankingDealers.co.za  This test is not yet approved or  cleared by the [place] FDA and has been authorized for detection and/or diagnosis of SARS-CoV-2 by FDA under an Emergency Use Authorization (EUA).  This EUA will remain in effect (meaning this test can be used) for the duration of the COVID-19 declaration under Section 564(b)(1) of the Act, 21 U.S.C. section 360bbb-3(b)(1), unless the authorization is terminated or revoked sooner.  Performed at [facility], [place] [address], [place], [place] [address]      Labs: BNP (last 3 results) No results for input(s): BNP in the last 8760 hours. Basic Metabolic Panel: Recent Labs  Lab 07/19/20 1900 07/20/20 0104 07/21/20 0342  NA 139  --  140  K 3.9  --  4.1  CL 103  --  108   CO2 25  --  25  GLUCOSE 127*  --  102*  BUN 17  --  23  CREATININE 0.99  --  1.07*  CALCIUM 10.6*  --  9.2  MG  --  2.2  --    Liver Function Tests: No results for input(s): AST, ALT, ALKPHOS, BILITOT, PROT, ALBUMIN in the last 168 hours. No results for input(s): LIPASE, AMYLASE in the last 168 hours. No results for input(s): AMMONIA in the last 168 hours. CBC: Recent Labs  Lab 07/19/20 1900 07/21/20 0342  WBC 11.5* 8.0  NEUTROABS 9.4*  --   HGB 12.5 11.2*  HCT 39.3 34.7*  MCV 94.9 97.2  PLT 176 153   Cardiac Enzymes: No results for input(s): CKTOTAL, CKMB, CKMBINDEX, TROPONINI in the last 168 hours. BNP: Invalid input(s): POCBNP CBG: No results for input(s): GLUCAP in the last 168 hours. D-Dimer No results for input(s): DDIMER in the last 72 hours. Hgb A1c No results for input(s): HGBA1C in the last 72 hours. Lipid Profile No results for input(s): CHOL, HDL, LDLCALC, TRIG, CHOLHDL, LDLDIRECT in the last 72 hours. Thyroid function studies No results for input(s): TSH, T4TOTAL, T3FREE, THYROIDAB in the last 72 hours.  Invalid input(s): FREET3 Anemia work up No results for input(s): VITAMINB12, FOLATE, FERRITIN, TIBC, IRON, RETICCTPCT in the last 72 hours. Urinalysis No  results found for: COLORURINE, APPEARANCEUR, [place], [place], GLUCOSEU, [place], BILIRUBINUR, [place], PROTEINUR, UROBILINOGEN, NITRITE, LEUKOCYTESUR Sepsis Labs Invalid input(s): PROCALCITONIN,  WBC,  LACTICIDVEN Microbiology Recent Results (from the past 240 hour(s))  SARS Coronavirus 2 by RT PCR (hospital order, performed in [facility] hospital lab) Nasopharyngeal Nasopharyngeal Swab     Status: None   Collection Time: 07/19/20  6:46 PM   Specimen: Nasopharyngeal Swab  Result Value Ref Range Status   SARS Coronavirus 2 NEGATIVE NEGATIVE Final    Comment: (NOTE) SARS-CoV-2 target nucleic acids are NOT DETECTED.  The SARS-CoV-2 RNA is generally detectable in upper and lower respiratory  specimens during the acute phase of infection. The lowest concentration of SARS-CoV-2 viral copies this assay can detect is 250 copies / mL. A negative result does not preclude SARS-CoV-2 infection and should not be used as the sole basis for treatment or other patient management decisions.  A negative result may occur with improper specimen collection / handling, submission of specimen other than nasopharyngeal swab, presence of viral mutation(s) within the areas targeted by this assay, and inadequate number of viral copies (<250 copies / mL). A negative result must be combined with clinical observations, patient history, and epidemiological information.  Fact Sheet for Patients:   StrictlyIdeas.no  Fact Sheet for Healthcare Providers: BankingDealers.co.za  This test is not yet approved or  cleared by the [place] FDA and has been authorized for detection and/or diagnosis of SARS-CoV-2 by FDA under an Emergency Use Authorization (EUA).  This EUA will remain in effect (meaning this test can be used) for the duration of the COVID-19 declaration under Section 564(b)(1) of the Act, 21 U.S.C. section 360bbb-3(b)(1), unless the authorization is terminated or revoked sooner.  Performed at [facility], [place] [address], [place], [place] [address]      Time coordinating discharge: 45 minutes  SIGNED:   Tawni Millers, MD  [facility] 07/24/2020, 12:07 PM

## 2020-07-25 ENCOUNTER — Encounter: Payer: Self-pay | Admitting: Internal Medicine

## 2020-07-25 ENCOUNTER — Non-Acute Institutional Stay (SKILLED_NURSING_FACILITY): Payer: Medicare Other | Admitting: Internal Medicine

## 2020-07-25 DIAGNOSIS — F418 Other specified anxiety disorders: Secondary | ICD-10-CM

## 2020-07-25 DIAGNOSIS — M069 Rheumatoid arthritis, unspecified: Secondary | ICD-10-CM | POA: Diagnosis not present

## 2020-07-25 DIAGNOSIS — I480 Paroxysmal atrial fibrillation: Secondary | ICD-10-CM

## 2020-07-25 DIAGNOSIS — S32051S Stable burst fracture of fifth lumbar vertebra, sequela: Secondary | ICD-10-CM | POA: Diagnosis not present

## 2020-07-25 DIAGNOSIS — F01518 Vascular dementia, unspecified severity, with other behavioral disturbance: Secondary | ICD-10-CM

## 2020-07-25 DIAGNOSIS — F0151 Vascular dementia with behavioral disturbance: Secondary | ICD-10-CM

## 2020-07-25 DIAGNOSIS — M810 Age-related osteoporosis without current pathological fracture: Secondary | ICD-10-CM | POA: Diagnosis not present

## 2020-07-25 MED ORDER — OXYCODONE HCL 5 MG PO CAPS
5.0000 mg | ORAL_CAPSULE | ORAL | 0 refills | Status: DC | PRN
Start: 2020-07-25 — End: 2020-08-04

## 2020-07-25 NOTE — Progress Notes (Signed)
Provider:  Veleta Miners MD Location:   [facility] Room Number: 50 Place of Service:  SNF ([phone])  PCP: Virgie Dad, MD Patient Care Team: Virgie Dad, MD as PCP - General (Internal Medicine) Pichardo-Geisinger, Mila Palmer, MD as Referring Physician Nestor Ramp, Effie Shy, MD as Referring Physician (Ophthalmology)  Extended Emergency Contact Information Primary Emergency Contact: Einar Grad Phone: [phone] Relation: Nephew Secondary Emergency Contact: Rhoderick Moody Mobile Phone: [phone] Relation: Niece  Code Status: Full Code Goals of Care: Advanced Directive information Advanced Directives 07/22/2020  Does Patient Have a Medical Advance Directive? Yes  Type of Advance Directive Living will  Does patient want to make changes to medical advance directive? No - Patient declined  Copy of [place] in Chart? -  Would patient like information on creating a medical advance directive? -  Pre-existing out of facility DNR order (yellow form or pink MOST form) -      Chief Complaint  Patient presents with  . New Admit To SNF    Admission    HPI: Patient is a 86 y.o. female seen today for admission to SNF for Therapy  Patient has a history ofPAF on Eliquis, GERD, Rheumatoid arthritis, Hyperlipidemia, Osteoporosis and Dementiawith Anxiety and Behavior issues Patient was living in [place] Was admitted in the hospital from 8/28-9/2 for nondisplaced fracture of L5 with after sustaining a mechanical fall.  Patient had a mechanical fall in the facility.  She had a severe pain in her lumbar area.  X-ray showed a nondisplaced fracture of L5.  The CT of head and cervical spines were negative.  She received therapy and pain was controlled with Roxanol oxycodone. She is now back in the facility  Per nurses patient is still having severe pain not controlled with every 6 hours oxycodone.  They want to changed the duration to every  4 Patient did not have any acute complaints.  So far has not had any behavioral problems.   Past Medical History:  Diagnosis Date  . Acid reflux disease   . Arthritis    rheumatoid  . Atrial fibrillation ([place])   . Back pain   . Bursitis of hip, right   . Cancer ([place])    skin  . Chest pain   . Confusion   . Constipation   . Fuchs' corneal dystrophy   . GERD (gastroesophageal reflux disease)   . Hard of hearing   . Knee pain   . Memory loss   . Paranoia ([place])   . Shoulder pain   . Venous insufficiency    Past Surgical History:  Procedure Laterality Date  . CORNEAL TRANSPLANT  2012   Dr Maudie Mercury   . RECONSTRUCTION OF EYELID     eyelid cancer 10 years ago Dr Victorino Dike   . RECONSTRUCTION OF NOSE     nose cancer/ Dr Nyoka Cowden 10 years ago     reports that she has never smoked. She has never used smokeless tobacco. She reports previous alcohol use. She reports previous drug use. Social History   Socioeconomic History  . Marital status: Widowed    Spouse name: Not on file  . Number of children: Not on file  . Years of education: Not on file  . Highest education level: Not on file  Occupational History  . Not on file  Tobacco Use  . Smoking status: Never Smoker  . Smokeless tobacco: Never Used  Vaping Use  . Vaping Use: Never used  Substance and Sexual Activity  . Alcohol use: Not Currently  . Drug use: Not Currently  . Sexual activity: Not on file  Other Topics Concern  . Not on file  Social History Narrative   Social History      Diet? No restrictions       Do you drink/eat things with caffeine? Sometimes       Marital status?  Widow                                  What year were you married? 1943      Do you live in a house, apartment, assisted living, condo, trailer, etc.? Assisted Living      Is it one or more stories? YES       How many persons live in your home? 1      Do you have any pets in your home? (please list) NO       Highest level of  education completed?high school       Current or past profession: Clerical       Do you exercise? Little                                      Type & how often? Walking        Advanced Directive       Do you have a living will?YES      Do you have a DNR form? NO                                 If not, do you want to discuss one?      Do you have signed POA/HPOA for forms? YES      Functional Status      Do you have difficulty bathing or dressing yourself?YES      Do you have difficulty preparing food or eating? NO      Do you have difficulty managing your medications?YES      Do you have difficulty managing your finances?YES      Do you have difficulty affording your medications?NO      Social Determinants of Health   Financial Resource Strain:   . Difficulty of Paying Living Expenses: Not on file  Food Insecurity:   . Worried About Charity fundraiser in the Last Year: Not on file  . Ran Out of Food in the Last Year: Not on file  Transportation Needs:   . Lack of Transportation (Medical): Not on file  . Lack of Transportation (Non-Medical): Not on file  Physical Activity:   . Days of Exercise per Week: Not on file  . Minutes of Exercise per Session: Not on file  Stress:   . Feeling of Stress : Not on file  Social Connections:   . Frequency of Communication with Friends and Family: Not on file  . Frequency of Social Gatherings with Friends and Family: Not on file  . Attends Religious Services: Not on file  . Active Member of Clubs or Organizations: Not on file  . Attends Archivist Meetings: Not on file  . Marital Status: Not on file  Intimate Partner Violence:   . Fear of Current or Ex-Partner: Not on file  .  Emotionally Abused: Not on file  . Physically Abused: Not on file  . Sexually Abused: Not on file    Functional Status Survey:    Family History  Problem Relation Age of Onset  . Heart disease Father   . Alzheimer's disease Sister   .  Parkinson's disease Sister     Health Maintenance  Topic Date Due  . DEXA SCAN  Never done  . INFLUENZA VACCINE  06/22/2020  . TETANUS/TDAP  04/19/2023  . COVID-19 Vaccine  Completed  . PNA vac Low Risk Adult  Completed    Allergies  Allergen Reactions  . Novocain [Procaine] Other (See Comments)    Unknown reaction  . Sulfamethoxazole-Trimethoprim Other (See Comments)    Unknown reaction  . Tape Other (See Comments)    Adhesive tape - unknown reaction  . Codeine Other (See Comments)    Unknown reaction  . Neosporin [Neomycin-Bacitracin Zn-Polymyx] Other (See Comments)    Unknown reaction    Allergies as of 07/25/2020      Reactions   Novocain [procaine] Other (See Comments)   Unknown reaction   Sulfamethoxazole-trimethoprim Other (See Comments)   Unknown reaction   Tape Other (See Comments)   Adhesive tape - unknown reaction   Codeine Other (See Comments)   Unknown reaction   Neosporin [neomycin-bacitracin Zn-polymyx] Other (See Comments)   Unknown reaction      Medication List       Accurate as of July 25, 2020  1:54 PM. If you have any questions, ask your nurse or doctor.        acetaminophen 500 MG tablet Commonly known as: TYLENOL Take 1 tablet (500 mg total) by mouth every 6 (six) hours as needed for moderate pain.   busPIRone 5 MG tablet Commonly known as: BUSPAR Take 5 mg by mouth daily.   denosumab 60 MG/ML Sosy injection Commonly known as: PROLIA Inject 60 mg into the skin every 6 (six) months. On the 22nd of reach 6th month   diclofenac Sodium 1 % Gel Commonly known as: VOLTAREN Apply 2 g topically 4 (four) times daily. Apply to lower back   Eliquis 2.5 MG Tabs tablet Generic drug: apixaban Take 2.5 mg by mouth 2 (two) times daily.   fluorouracil 5 % cream Commonly known as: EFUDEX Apply 1 application topically See admin instructions. Apply peasized amount topically to right temple, nose and right upper lip twice daily for 3 weeks    folic acid 1 MG tablet Commonly known as: FOLVITE Take 1 mg by mouth daily.   memantine 10 MG tablet Commonly known as: NAMENDA Take 10 mg by mouth 2 (two) times daily.   methocarbamol 500 MG tablet Commonly known as: ROBAXIN Take 1 tablet (500 mg total) by mouth every 8 (eight) hours as needed for muscle spasms.   methotrexate 2.5 MG tablet Commonly known as: RHEUMATREX Take 15 mg by mouth every Friday. Caution:Chemotherapy. Protect from light.   mineral oil-hydrophilic petrolatum ointment Apply 1 application topically 2 (two) times daily as needed for dry skin. Apply to forehead, legs, and right mid back   oxyCODONE 5 MG immediate release tablet Commonly known as: Oxy IR/ROXICODONE Take 1 tablet (5 mg total) by mouth every 6 (six) hours as needed for severe pain.   pravastatin 20 MG tablet Commonly known as: PRAVACHOL Take 20 mg by mouth at bedtime.   prednisoLONE acetate 1 % ophthalmic suspension Commonly known as: PRED FORTE Place 1 drop into the right eye daily.  QUEtiapine 25 MG tablet Commonly known as: SEROQUEL Take 12.5 mg by mouth 2 (two) times daily.   Senexon-S 8.6-50 MG tablet Generic drug: senna-docusate Take 1 tablet by mouth daily as needed for mild constipation.   sertraline 50 MG tablet Commonly known as: ZOLOFT Take 50 mg by mouth daily.   sodium chloride 5 % ophthalmic solution Commonly known as: MURO 128 Place 1 drop into both eyes 4 (four) times daily.   Systane 0.4-0.3 % Soln Generic drug: Polyethyl Glycol-Propyl Glycol Place 1 drop into both eyes in the morning and at bedtime.       Review of Systems  Review of Systems  Constitutional: Negative for activity change, appetite change, chills, diaphoresis, fatigue and fever.  HENT: Negative for mouth sores, postnasal drip, rhinorrhea, sinus pain and sore throat.   Respiratory: Negative for apnea, cough, chest tightness, shortness of breath and wheezing.   Cardiovascular: Negative for  chest pain, palpitations and leg swelling.  Gastrointestinal: Negative for abdominal distention, abdominal pain, constipation, diarrhea, nausea and vomiting.  Genitourinary: Negative for dysuria and frequency.  Musculoskeletal: Negative for arthralgias, joint swelling and myalgias.  Skin: Negative for rash.  Neurological: Negative for dizziness, syncope, weakness, light-headedness and numbness.  Psychiatric/Behavioral: Negative for behavioral problems, confusion and sleep disturbance.     Vitals:   07/25/20 1041  BP: (!) 150/86  Pulse: 76  Temp: 98.5 F (36.9 C)  SpO2: 96%  Weight: 153 lb (69.4 kg)  Height: 5\' 6"  (1.676 m)   Body mass index is 24.69 kg/m. Physical Exam  Constitutional: . Well-developed and well-nourished.  HENT:  Head: Normocephalic.  Mouth/Throat: Oropharynx is clear and moist.  Eyes: Pupils are equal, round, and reactive to light.  Neck: Neck supple.  Cardiovascular: Normal rate and normal heart sounds.  No murmur heard. Pulmonary/Chest: Effort normal and breath sounds normal. No respiratory distress. No wheezes. She has no rales.  Abdominal: Soft. Bowel sounds are normal. No distension. There is no tenderness. There is no rebound.  Musculoskeletal: No edema. With Chronic Venous Changes Lymphadenopathy: none Neurological: NO Focal Deficits  Skin: Skin is warm and dry.  Psychiatric: Normal mood and affect. Behavior is normal. Thought content normal.    Labs reviewed: Basic Metabolic Panel: Recent Labs    04/24/20 0000 07/19/20 1900 07/20/20 0104 07/21/20 0342  NA 142 139  --  140  K 4.2 3.9  --  4.1  CL 108 103  --  108  CO2 29* 25  --  25  GLUCOSE  --  127*  --  102*  BUN 13 17  --  23  CREATININE 1.0 0.99  --  1.07*  CALCIUM 9.5 10.6*  --  9.2  MG  --   --  2.2  --    Liver Function Tests: Recent Labs    12/08/19 0000 04/24/20 0000  AST 19 17  ALT 11 11  ALKPHOS 41 41   No results for input(s): LIPASE, AMYLASE in the last 8760  hours. No results for input(s): AMMONIA in the last 8760 hours. CBC: Recent Labs    04/24/20 0000 07/19/20 1900 07/21/20 0342  WBC 4.6 11.5* 8.0  NEUTROABS  --  9.4*  --   HGB 11.3* 12.5 11.2*  HCT 34* 39.3 34.7*  MCV  --  94.9 97.2  PLT 186 176 153   Cardiac Enzymes: No results for input(s): CKTOTAL, CKMB, CKMBINDEX, TROPONINI in the last 8760 hours. BNP: Invalid input(s): POCBNP No results found for: HGBA1C Lab  Results  Component Value Date   TSH 2.45 04/24/2020   No results found for: VITAMINB12 No results found for: FOLATE No results found for: IRON, TIBC, FERRITIN  Imaging and Procedures obtained prior to SNF admission: DG Chest 1 View   Result Date: 07/19/2020 CLINICAL DATA:  Status post fall. EXAM: CHEST  1 VIEW COMPARISON:  November 26, 2018 FINDINGS: Mild, chronic appearing diffusely increased lung markings are seen. There is no evidence of acute infiltrate, pleural effusion or pneumothorax. The heart size and mediastinal contours are within normal limits. There is marked severity calcification of the thoracic aorta. There is a chronic fracture deformity of the sixth right rib. An additional chronic deformity of the mid left clavicle is seen. Degenerative changes are noted throughout the thoracic spine. IMPRESSION: Chronic appearing interstitial lung disease without evidence of acute or active cardiopulmonary disease. Electronically Signed   By: Virgina Norfolk M.D.   On: 07/19/2020 16:28   DG Lumbar Spine 2-3 Views  Result Date: 07/19/2020 CLINICAL DATA:  Unwitnessed fall.  Low back pain. Unable to stand. EXAM: LUMBAR SPINE - 2-3 VIEW COMPARISON:  None. FINDINGS: No evidence of acute fracture. Vertebral body heights are preserved. There is 5 mm anterolisthesis of L4 on L5 that is likely degenerative. Disc space narrowing and endplate spurring most prominent at L4-L5 and L5-S1. bones are diffusely under mineralized. Multilevel facet hypertrophy. The sacroiliac joints are  congruent with mild degenerative change. Aortic atherosclerosis. IMPRESSION: 1. Multilevel degenerative disc disease and facet hypertrophy in the lumbar spine. No evidence of acute fracture. 2. Grade 1 anterolisthesis of L4 on L5, likely degenerative. Electronically Signed   By: Keith Rake M.D.   On: 07/19/2020 16:31   DG Pelvis 1-2 Views  Result Date: 07/19/2020 CLINICAL DATA:  Unwitnessed fall.  Low back pain.  Unable to stand. EXAM: PELVIS - 1-2 VIEW COMPARISON:  None. FINDINGS: The cortical margins of the bony pelvis are intact. No fracture. Pubic symphysis and sacroiliac joints are congruent. Portions of the pubic rami are partially obscured by overlying stool/bowel gas. Both femoral heads are well-seated in the respective acetabula. Moderate bilateral hip osteoarthritis with joint space narrowing. IMPRESSION: 1. No pelvic fracture. 2. Moderate bilateral hip osteoarthritis. Electronically Signed   By: Keith Rake M.D.   On: 07/19/2020 16:29   CT Head Wo Contrast  Result Date: 07/19/2020 CLINICAL DATA:  Unwitnessed fall.  On Eliquis. EXAM: CT HEAD WITHOUT CONTRAST CT CERVICAL SPINE WITHOUT CONTRAST TECHNIQUE: Multidetector CT imaging of the head and cervical spine was performed following the standard protocol without intravenous contrast. Multiplanar CT image reconstructions of the cervical spine were also generated. COMPARISON:  MRI brain dated May 28, 2017. CT head and cervical spine dated March 11, 2016. FINDINGS: CT HEAD FINDINGS Brain: No evidence of acute infarction, hemorrhage, hydrocephalus, extra-axial collection or mass lesion/mass effect. Stable atrophy and chronic microvascular ischemic changes. Slightly asymmetric hyperdensity near the left hippocampus and temporal horn is favored to represent choroid plexus. Vascular: Atherosclerotic vascular calcification of the carotid siphons. No hyperdense vessel. Skull: Normal. Negative for fracture or focal lesion. Sinuses/Orbits: Small  air-fluid level in the right sphenoid sinus. Frothy secretions a left posterior ethmoid air cell. The orbits are unremarkable. Other: None. CT CERVICAL SPINE FINDINGS Alignment: No traumatic malalignment. Unchanged trace anterolisthesis at C4-C5 and C7-T1. Skull base and vertebrae: No acute fracture. No primary bone lesion or focal pathologic process. Soft tissues and spinal canal: No prevertebral fluid or swelling. No visible canal hematoma. Disc levels: Multilevel  disc height loss, severe at C4-C5 and C5-C6. Moderate to severe facet uncovertebral hypertrophy throughout the cervical spine. Findings are similar to prior study Upper chest: Biapical pleuroparenchymal scarring. Other: Secretions in the trachea. IMPRESSION: 1. No acute intracranial abnormality. Stable atrophy and chronic microvascular ischemic changes. 2. No acute cervical spine fracture or traumatic malalignment. 3. Secretions in the trachea. Correlate for aspiration. Electronically Signed   By: Titus Dubin M.D.   On: 07/19/2020 16:17   CT Cervical Spine Wo Contrast  Result Date: 07/19/2020 CLINICAL DATA:  Unwitnessed fall.  On Eliquis. EXAM: CT HEAD WITHOUT CONTRAST CT CERVICAL SPINE WITHOUT CONTRAST TECHNIQUE: Multidetector CT imaging of the head and cervical spine was performed following the standard protocol without intravenous contrast. Multiplanar CT image reconstructions of the cervical spine were also generated. COMPARISON:  MRI brain dated May 28, 2017. CT head and cervical spine dated March 11, 2016. FINDINGS: CT HEAD FINDINGS Brain: No evidence of acute infarction, hemorrhage, hydrocephalus, extra-axial collection or mass lesion/mass effect. Stable atrophy and chronic microvascular ischemic changes. Slightly asymmetric hyperdensity near the left hippocampus and temporal horn is favored to represent choroid plexus. Vascular: Atherosclerotic vascular calcification of the carotid siphons. No hyperdense vessel. Skull: Normal. Negative  for fracture or focal lesion. Sinuses/Orbits: Small air-fluid level in the right sphenoid sinus. Frothy secretions a left posterior ethmoid air cell. The orbits are unremarkable. Other: None. CT CERVICAL SPINE FINDINGS Alignment: No traumatic malalignment. Unchanged trace anterolisthesis at C4-C5 and C7-T1. Skull base and vertebrae: No acute fracture. No primary bone lesion or focal pathologic process. Soft tissues and spinal canal: No prevertebral fluid or swelling. No visible canal hematoma. Disc levels: Multilevel disc height loss, severe at C4-C5 and C5-C6. Moderate to severe facet uncovertebral hypertrophy throughout the cervical spine. Findings are similar to prior study Upper chest: Biapical pleuroparenchymal scarring. Other: Secretions in the trachea. IMPRESSION: 1. No acute intracranial abnormality. Stable atrophy and chronic microvascular ischemic changes. 2. No acute cervical spine fracture or traumatic malalignment. 3. Secretions in the trachea. Correlate for aspiration. Electronically Signed   By: Titus Dubin M.D.   On: 07/19/2020 16:17   CT Lumbar Spine Wo Contrast  Result Date: 07/19/2020 CLINICAL DATA:  Fall.  Back pain. EXAM: CT LUMBAR SPINE WITHOUT CONTRAST TECHNIQUE: Multidetector CT imaging of the lumbar spine was performed without intravenous contrast administration. Multiplanar CT image reconstructions were also generated. COMPARISON:  Lumbar radiographs 07/19/2020 FINDINGS: Segmentation: Normal Alignment: Mild anterolisthesis L2-3, L3-4, L4-5 Vertebrae: Nondisplaced acute fractures of the L5 transverse process bilaterally. No vertebral body fracture. Paraspinal and other soft tissues: Atherosclerotic calcification aorta and iliac arteries without aneurysm. No paraspinous mass or adenopathy. Right renal cyst. Disc levels: T11-12: Moderate disc degeneration and spurring. Moderate foraminal narrowing bilaterally due to spurring T12-L1: Mild disc degeneration.  Negative for stenosis. L1-2:  Disc degeneration and mild spurring. No significant stenosis. Mild facet degeneration. L2-3: Disc degeneration with diffuse endplate spurring. Bilateral facet degeneration. Mild spinal stenosis. Moderate subarticular stenosis on the left and mild subarticular stenosis on the right. L3-4: Disc degeneration with disc bulging. Bilateral facet hypertrophy. Moderate subarticular stenosis on the right. Spinal canal adequate in size L4-5: Broad-based central disc protrusion with diffuse endplate spurring and bilateral facet degeneration. Moderate subarticular stenosis bilaterally L5-S1: Mild disc and mild facet degeneration. No significant stenosis. IMPRESSION: Nondisplaced fractures of the L5 transverse process bilaterally. These appear acute. No fracture of the vertebral body. Multilevel degenerative change throughout the lumbar spine as above. Electronically Signed   By: Juanda Crumble  Carlis Abbott M.D.   On: 07/19/2020 20:12    Assessment/Plan Closed stable burst fracture of fifth lumbar vertebra, sequela Increase the Oxycodone to 5 mg Q4 hours Continue Robaxin  Also Start on Tyelnol 500 mg QID Getting therapy  PAF (paroxysmal atrial fibrillation) (HCC) Rate controlled On Eliquis Age-related osteoporosis without current pathological fracture Prolia Rheumatoid arthritis, involving unspecified site, unspecified whether rheumatoid factor present ([place]) Methotrexate Depression with anxiety Doing well with Zoloft Vascular dementia with behavior disturbance (HCC) Namenda, Seroquel and Buspar for behaviors  Hyperlipidemia, unspecified hyperlipidemia type On statin LDL 78  Family/ staff Communication:   Labs/tests ordered:

## 2020-07-31 LAB — COMPREHENSIVE METABOLIC PANEL
Albumin: 3.7 (ref 3.5–5.0)
Calcium: 8.7 (ref 8.7–10.7)
Globulin: 2.2

## 2020-07-31 LAB — HEPATIC FUNCTION PANEL
ALT: 14 (ref 7–35)
AST: 19 (ref 13–35)
Alkaline Phosphatase: 81 (ref 25–125)
Bilirubin, Total: 0.8

## 2020-07-31 LAB — BASIC METABOLIC PANEL
BUN: 19 (ref 4–21)
CO2: 25 — AB (ref 13–22)
Chloride: 106 (ref 99–108)
Creatinine: 0.9 (ref 0.5–1.1)
Glucose: 80
Potassium: 4.2 (ref 3.4–5.3)
Sodium: 139 (ref 137–147)

## 2020-07-31 LAB — CBC AND DIFFERENTIAL
HCT: 35 — AB (ref 36–46)
Hemoglobin: 11.6 — AB (ref 12.0–16.0)
Neutrophils Absolute: 5672
Platelets: 281 (ref 150–399)
WBC: 8.7

## 2020-07-31 LAB — CBC: RBC: 3.72 — AB (ref 3.87–5.11)

## 2020-08-01 ENCOUNTER — Encounter: Payer: Self-pay | Admitting: Internal Medicine

## 2020-08-01 ENCOUNTER — Non-Acute Institutional Stay (SKILLED_NURSING_FACILITY): Payer: Medicare Other | Admitting: Internal Medicine

## 2020-08-01 DIAGNOSIS — S32051S Stable burst fracture of fifth lumbar vertebra, sequela: Secondary | ICD-10-CM | POA: Diagnosis not present

## 2020-08-01 DIAGNOSIS — M069 Rheumatoid arthritis, unspecified: Secondary | ICD-10-CM

## 2020-08-01 DIAGNOSIS — F0151 Vascular dementia with behavioral disturbance: Secondary | ICD-10-CM

## 2020-08-01 DIAGNOSIS — I480 Paroxysmal atrial fibrillation: Secondary | ICD-10-CM | POA: Diagnosis not present

## 2020-08-01 DIAGNOSIS — M810 Age-related osteoporosis without current pathological fracture: Secondary | ICD-10-CM

## 2020-08-01 DIAGNOSIS — F01518 Vascular dementia, unspecified severity, with other behavioral disturbance: Secondary | ICD-10-CM

## 2020-08-01 DIAGNOSIS — F418 Other specified anxiety disorders: Secondary | ICD-10-CM

## 2020-08-01 NOTE — Progress Notes (Signed)
Location:   [facility] Room Number: 50 Place of Service:  SNF [phone]) Provider:  Veleta Miners MD  Virgie Dad, MD  Patient Care Team: Virgie Dad, MD as PCP - General (Internal Medicine) Pichardo-Geisinger, Mila Palmer, MD as Referring Physician Nestor Ramp Effie Shy, MD as Referring Physician (Ophthalmology)  Extended Emergency Contact Information Primary Emergency Contact: Key, Tim Address: [address]          [place], [place] [address] [name] of [place] Phone: [phone] Relation: [name] Secondary Emergency Contact: Owens Shark Address: [address]          [place], [place] [address] [name] of [place] Phone: [phone] Mobile Phone: [phone] Relation: Other  Code Status: Full Code Goals of care: Advanced Directive information Advanced Directives 07/22/2020  Does Patient Have a Medical Advance Directive? Yes  Type of Advance Directive Living will  Does patient want to make changes to medical advance directive? No - Patient declined  Copy of [place] in Chart? -  Would patient like information on creating a medical advance directive? -  Pre-existing out of facility DNR order (yellow form or pink MOST form) -     Chief Complaint  Patient presents with  . Acute Visit    Pain and Behavior Issues     HPI:  Pt is a 86 y.o. female seen today for an acute visit for Pain control and Behavior issues  Patient has a history ofPAF on Eliquis, GERD, Rheumatoid arthritis, Hyperlipidemia, Osteoporosis and Dementiawith Anxiety and Behavior issues Patient was living in [place] Was admitted in the hospital from 8/28-9/2 for nondisplaced fracture of L5 with after sustaining a mechanical fall.  Since being back patient has been having issues with uncontrolled back pain.  She was unable to give me much history.  But per nurses and therapy she is not participating much as she keeps screaming that her  back hurts. Not sure if it is on the pain or her cognition. The therapy did try the brace but does not look like it has helped.  Past Medical History:  Diagnosis Date  . Acid reflux disease   . Arthritis    rheumatoid  . Atrial fibrillation ([place])   . Back pain   . Bursitis of hip, right   . Cancer ([place])    skin  . Chest pain   . Confusion   . Constipation   . Fuchs' corneal dystrophy   . GERD (gastroesophageal reflux disease)   . Hard of hearing   . Knee pain   . Memory loss   . Paranoia ([place])   . Shoulder pain   . Venous insufficiency    Past Surgical History:  Procedure Laterality Date  . CORNEAL TRANSPLANT  2012   Dr Maudie Mercury   . RECONSTRUCTION OF EYELID     eyelid cancer 10 years ago Dr Victorino Dike   . RECONSTRUCTION OF NOSE     nose cancer/ Dr Nyoka Cowden 10 years ago     Allergies  Allergen Reactions  . Novocain [Procaine] Other (See Comments)    Unknown reaction  . Sulfamethoxazole-Trimethoprim Other (See Comments)    Unknown reaction  . Tape Other (See Comments)    Adhesive tape - unknown reaction  . Codeine Other (See Comments)    Unknown reaction  . Neosporin [Neomycin-Bacitracin Zn-Polymyx] Other (See Comments)    Unknown reaction    Allergies as of 08/01/2020      Reactions   Novocain [  procaine] Other (See Comments)   Unknown reaction   Sulfamethoxazole-trimethoprim Other (See Comments)   Unknown reaction   Tape Other (See Comments)   Adhesive tape - unknown reaction   Codeine Other (See Comments)   Unknown reaction   Neosporin [neomycin-bacitracin Zn-polymyx] Other (See Comments)   Unknown reaction      Medication List       Accurate as of August 01, 2020  2:32 PM. If you have any questions, ask your nurse or doctor.        acetaminophen 325 MG tablet Commonly known as: TYLENOL Take 650 mg by mouth in the morning, at noon, in the evening, and at bedtime. What changed: Another medication with the same name was removed. Continue taking this  medication, and follow the directions you see here. Changed by: Virgie Dad, MD   busPIRone 5 MG tablet Commonly known as: BUSPAR Take 5 mg by mouth daily.   denosumab 60 MG/ML Sosy injection Commonly known as: PROLIA Inject 60 mg into the skin every 6 (six) months. On the 22nd of reach 6th month   diclofenac Sodium 1 % Gel Commonly known as: VOLTAREN Apply 2 g topically 4 (four) times daily. Apply to lower back   Eliquis 2.5 MG Tabs tablet Generic drug: apixaban Take 2.5 mg by mouth 2 (two) times daily.   fluorouracil 5 % cream Commonly known as: EFUDEX Apply 1 application topically See admin instructions. Apply peasized amount topically to right temple, nose and right upper lip twice daily for 3 weeks   folic acid 1 MG tablet Commonly known as: FOLVITE Take 1 mg by mouth daily.   magnesium hydroxide 400 MG/5ML suspension Commonly known as: MILK OF MAGNESIA Take 30 mLs by mouth daily as needed for mild constipation.   memantine 10 MG tablet Commonly known as: NAMENDA Take 10 mg by mouth 2 (two) times daily.   methocarbamol 500 MG tablet Commonly known as: ROBAXIN Take 1 tablet (500 mg total) by mouth every 8 (eight) hours as needed for muscle spasms.   methotrexate 2.5 MG tablet Commonly known as: RHEUMATREX Take 15 mg by mouth every Friday. Caution:Chemotherapy. Protect from light.   mineral oil-hydrophilic petrolatum ointment Apply 1 application topically 2 (two) times daily as needed for dry skin. Apply to forehead, legs, and right mid back   oxycodone 5 MG capsule Commonly known as: OXY-IR Take 1 capsule (5 mg total) by mouth every 4 (four) hours as needed for up to 14 days.   pravastatin 20 MG tablet Commonly known as: PRAVACHOL Take 20 mg by mouth at bedtime.   prednisoLONE acetate 1 % ophthalmic suspension Commonly known as: PRED FORTE Place 1 drop into the right eye daily.   QUEtiapine 25 MG tablet Commonly known as: SEROQUEL Take 12.5 mg by  mouth 2 (two) times daily.   Senexon-S 8.6-50 MG tablet Generic drug: senna-docusate Take 1 tablet by mouth daily as needed for mild constipation.   sertraline 50 MG tablet Commonly known as: ZOLOFT Take 50 mg by mouth daily.   sodium chloride 5 % ophthalmic solution Commonly known as: MURO 128 Place 1 drop into both eyes 4 (four) times daily.   Systane 0.4-0.3 % Soln Generic drug: Polyethyl Glycol-Propyl Glycol Place 1 drop into both eyes in the morning and at bedtime.       Review of Systems  Unable to perform ROS: Dementia    Immunization History  Administered Date(s) Administered  . Influenza, High Dose Seasonal PF 08/25/2019  .  Influenza-Unspecified 10/01/2014  . Moderna SARS-COVID-2 Vaccination 11/24/2019, 12/22/2019  . Pneumococcal Conjugate-13 10/21/2014  . Pneumococcal Polysaccharide-23 01/23/2018  . Td 04/18/2013  . Zoster 09/04/2012   Pertinent  Health Maintenance Due  Topic Date Due  . DEXA SCAN  Never done  . INFLUENZA VACCINE  06/22/2020  . PNA vac Low Risk Adult  Completed   No flowsheet data found. Functional Status Survey:    Vitals:   08/01/20 1418  BP: 128/74  Pulse: 84  Resp: 18  Temp: 98.5 F (36.9 C)  SpO2: 94%  Weight: 153 lb (69.4 kg)  Height: 5\' 6"  (1.676 m)   Body mass index is 24.69 kg/m. Physical Exam Vitals reviewed.  Constitutional:      Appearance: Normal appearance.  HENT:     Head: Normocephalic.     Nose: Nose normal.     Mouth/Throat:     Mouth: Mucous membranes are moist.     Pharynx: Oropharynx is clear.  Eyes:     Pupils: Pupils are equal, round, and reactive to light.  Cardiovascular:     Rate and Rhythm: Normal rate and regular rhythm.     Pulses: Normal pulses.     Heart sounds: Normal heart sounds.  Pulmonary:     Effort: Pulmonary effort is normal.     Breath sounds: Normal breath sounds.  Abdominal:     General: Abdomen is flat. Bowel sounds are normal.     Palpations: Abdomen is soft.    Musculoskeletal:        General: No swelling.     Cervical back: Neck supple.  Skin:    General: Skin is warm.  Neurological:     General: No focal deficit present.     Mental Status: She is alert.  Psychiatric:        Mood and Affect: Mood normal.        Thought Content: Thought content normal.     Labs reviewed: Recent Labs    07/19/20 1900 07/20/20 0104 07/21/20 0342 07/31/20 0000  NA 139  --  140 139  K 3.9  --  4.1 4.2  CL 103  --  108 106  CO2 25  --  25 25*  GLUCOSE 127*  --  102*  --   BUN 17  --  23 19  CREATININE 0.99  --  1.07* 0.9  CALCIUM 10.6*  --  9.2 8.7  MG  --  2.2  --   --    Recent Labs    12/08/19 0000 04/24/20 0000 07/31/20 0000  AST 19 17 19   ALT 11 11 14   ALKPHOS 41 41 81  ALBUMIN  --   --  3.7   Recent Labs    07/19/20 1900 07/21/20 0342 07/31/20 0000  WBC 11.5* 8.0 8.7  NEUTROABS 9.4*  --  5,672  HGB 12.5 11.2* 11.6*  HCT 39.3 34.7* 35*  MCV 94.9 97.2  --   PLT 176 153 281   Lab Results  Component Value Date   TSH 2.45 04/24/2020   No results found for: HGBA1C Lab Results  Component Value Date   CHOL 141 12/08/2019   HDL 49 12/08/2019   LDLCALC 78 12/08/2019   TRIG 59 12/08/2019    Significant Diagnostic Results in last 30 days:  DG Chest 1 View  Result Date: 07/19/2020 CLINICAL DATA:  Status post fall. EXAM: CHEST  1 VIEW COMPARISON:  November 26, 2018 FINDINGS: Mild, chronic appearing diffusely increased lung markings  are seen. There is no evidence of acute infiltrate, pleural effusion or pneumothorax. The heart size and mediastinal contours are within normal limits. There is marked severity calcification of the thoracic aorta. There is a chronic fracture deformity of the sixth right rib. An additional chronic deformity of the mid left clavicle is seen. Degenerative changes are noted throughout the thoracic spine. IMPRESSION: Chronic appearing interstitial lung disease without evidence of acute or active cardiopulmonary  disease. Electronically Signed   By: Virgina Norfolk M.D.   On: 07/19/2020 16:28   DG Lumbar Spine 2-3 Views  Result Date: 07/19/2020 CLINICAL DATA:  Unwitnessed fall.  Low back pain. Unable to stand. EXAM: LUMBAR SPINE - 2-3 VIEW COMPARISON:  None. FINDINGS: No evidence of acute fracture. Vertebral body heights are preserved. There is 5 mm anterolisthesis of L4 on L5 that is likely degenerative. Disc space narrowing and endplate spurring most prominent at L4-L5 and L5-S1. bones are diffusely under mineralized. Multilevel facet hypertrophy. The sacroiliac joints are congruent with mild degenerative change. Aortic atherosclerosis. IMPRESSION: 1. Multilevel degenerative disc disease and facet hypertrophy in the lumbar spine. No evidence of acute fracture. 2. Grade 1 anterolisthesis of L4 on L5, likely degenerative. Electronically Signed   By: Keith Rake M.D.   On: 07/19/2020 16:31   DG Pelvis 1-2 Views  Result Date: 07/19/2020 CLINICAL DATA:  Unwitnessed fall.  Low back pain.  Unable to stand. EXAM: PELVIS - 1-2 VIEW COMPARISON:  None. FINDINGS: The cortical margins of the bony pelvis are intact. No fracture. Pubic symphysis and sacroiliac joints are congruent. Portions of the pubic rami are partially obscured by overlying stool/bowel gas. Both femoral heads are well-seated in the respective acetabula. Moderate bilateral hip osteoarthritis with joint space narrowing. IMPRESSION: 1. No pelvic fracture. 2. Moderate bilateral hip osteoarthritis. Electronically Signed   By: Keith Rake M.D.   On: 07/19/2020 16:29   CT Head Wo Contrast  Result Date: 07/19/2020 CLINICAL DATA:  Unwitnessed fall.  On Eliquis. EXAM: CT HEAD WITHOUT CONTRAST CT CERVICAL SPINE WITHOUT CONTRAST TECHNIQUE: Multidetector CT imaging of the head and cervical spine was performed following the standard protocol without intravenous contrast. Multiplanar CT image reconstructions of the cervical spine were also generated.  COMPARISON:  MRI brain dated May 28, 2017. CT head and cervical spine dated March 11, 2016. FINDINGS: CT HEAD FINDINGS Brain: No evidence of acute infarction, hemorrhage, hydrocephalus, extra-axial collection or mass lesion/mass effect. Stable atrophy and chronic microvascular ischemic changes. Slightly asymmetric hyperdensity near the left hippocampus and temporal horn is favored to represent choroid plexus. Vascular: Atherosclerotic vascular calcification of the carotid siphons. No hyperdense vessel. Skull: Normal. Negative for fracture or focal lesion. Sinuses/Orbits: Small air-fluid level in the right sphenoid sinus. Frothy secretions a left posterior ethmoid air cell. The orbits are unremarkable. Other: None. CT CERVICAL SPINE FINDINGS Alignment: No traumatic malalignment. Unchanged trace anterolisthesis at C4-C5 and C7-T1. Skull base and vertebrae: No acute fracture. No primary bone lesion or focal pathologic process. Soft tissues and spinal canal: No prevertebral fluid or swelling. No visible canal hematoma. Disc levels: Multilevel disc height loss, severe at C4-C5 and C5-C6. Moderate to severe facet uncovertebral hypertrophy throughout the cervical spine. Findings are similar to prior study Upper chest: Biapical pleuroparenchymal scarring. Other: Secretions in the trachea. IMPRESSION: 1. No acute intracranial abnormality. Stable atrophy and chronic microvascular ischemic changes. 2. No acute cervical spine fracture or traumatic malalignment. 3. Secretions in the trachea. Correlate for aspiration. Electronically Signed   By: Huntley Dec  Derry M.D.   On: 07/19/2020 16:17   CT Cervical Spine Wo Contrast  Result Date: 07/19/2020 CLINICAL DATA:  Unwitnessed fall.  On Eliquis. EXAM: CT HEAD WITHOUT CONTRAST CT CERVICAL SPINE WITHOUT CONTRAST TECHNIQUE: Multidetector CT imaging of the head and cervical spine was performed following the standard protocol without intravenous contrast. Multiplanar CT image  reconstructions of the cervical spine were also generated. COMPARISON:  MRI brain dated May 28, 2017. CT head and cervical spine dated March 11, 2016. FINDINGS: CT HEAD FINDINGS Brain: No evidence of acute infarction, hemorrhage, hydrocephalus, extra-axial collection or mass lesion/mass effect. Stable atrophy and chronic microvascular ischemic changes. Slightly asymmetric hyperdensity near the left hippocampus and temporal horn is favored to represent choroid plexus. Vascular: Atherosclerotic vascular calcification of the carotid siphons. No hyperdense vessel. Skull: Normal. Negative for fracture or focal lesion. Sinuses/Orbits: Small air-fluid level in the right sphenoid sinus. Frothy secretions a left posterior ethmoid air cell. The orbits are unremarkable. Other: None. CT CERVICAL SPINE FINDINGS Alignment: No traumatic malalignment. Unchanged trace anterolisthesis at C4-C5 and C7-T1. Skull base and vertebrae: No acute fracture. No primary bone lesion or focal pathologic process. Soft tissues and spinal canal: No prevertebral fluid or swelling. No visible canal hematoma. Disc levels: Multilevel disc height loss, severe at C4-C5 and C5-C6. Moderate to severe facet uncovertebral hypertrophy throughout the cervical spine. Findings are similar to prior study Upper chest: Biapical pleuroparenchymal scarring. Other: Secretions in the trachea. IMPRESSION: 1. No acute intracranial abnormality. Stable atrophy and chronic microvascular ischemic changes. 2. No acute cervical spine fracture or traumatic malalignment. 3. Secretions in the trachea. Correlate for aspiration. Electronically Signed   By: Titus Dubin M.D.   On: 07/19/2020 16:17   CT Lumbar Spine Wo Contrast  Result Date: 07/19/2020 CLINICAL DATA:  Fall.  Back pain. EXAM: CT LUMBAR SPINE WITHOUT CONTRAST TECHNIQUE: Multidetector CT imaging of the lumbar spine was performed without intravenous contrast administration. Multiplanar CT image reconstructions were  also generated. COMPARISON:  Lumbar radiographs 07/19/2020 FINDINGS: Segmentation: Normal Alignment: Mild anterolisthesis L2-3, L3-4, L4-5 Vertebrae: Nondisplaced acute fractures of the L5 transverse process bilaterally. No vertebral body fracture. Paraspinal and other soft tissues: Atherosclerotic calcification aorta and iliac arteries without aneurysm. No paraspinous mass or adenopathy. Right renal cyst. Disc levels: T11-12: Moderate disc degeneration and spurring. Moderate foraminal narrowing bilaterally due to spurring T12-L1: Mild disc degeneration.  Negative for stenosis. L1-2: Disc degeneration and mild spurring. No significant stenosis. Mild facet degeneration. L2-3: Disc degeneration with diffuse endplate spurring. Bilateral facet degeneration. Mild spinal stenosis. Moderate subarticular stenosis on the left and mild subarticular stenosis on the right. L3-4: Disc degeneration with disc bulging. Bilateral facet hypertrophy. Moderate subarticular stenosis on the right. Spinal canal adequate in size L4-5: Broad-based central disc protrusion with diffuse endplate spurring and bilateral facet degeneration. Moderate subarticular stenosis bilaterally L5-S1: Mild disc and mild facet degeneration. No significant stenosis. IMPRESSION: Nondisplaced fractures of the L5 transverse process bilaterally. These appear acute. No fracture of the vertebral body. Multilevel degenerative change throughout the lumbar spine as above. Electronically Signed   By: Franchot Gallo M.D.   On: 07/19/2020 20:12    Assessment/Plan Closed stable burst fracture of fifth lumbar vertebra, sequela Will increase her oxycodone to 7.5 mg every 4 hours as needed.  She will get 10 mg every night so that she can sleep well at night and then work with therapy in the morning. We will continue Tylenol and Robaxin PAF (paroxysmal atrial fibrillation) (HCC) Rate controlled on Eliquis  Age-related osteoporosis without current pathological  fracture Continue on Prolia Rheumatoid arthritis, involving unspecified site, unspecified whether rheumatoid factor present ([place]) On methotrexate Depression with anxiety Doing well on Zoloft Vascular dementia with behavior disturbance ([facility]) Continue Aricept Namenda and BuSpar  Hyperlipidemia, unspecified hyperlipidemia type On statin LDL 78   Family/ staff Communication:   Labs/tests ordered:

## 2020-08-04 ENCOUNTER — Other Ambulatory Visit: Payer: Self-pay | Admitting: Internal Medicine

## 2020-08-04 MED ORDER — OXYCODONE HCL 5 MG PO TABS
5.0000 mg | ORAL_TABLET | ORAL | 0 refills | Status: DC | PRN
Start: 2020-08-04 — End: 2020-08-14

## 2020-08-14 ENCOUNTER — Encounter: Payer: Self-pay | Admitting: Internal Medicine

## 2020-08-14 ENCOUNTER — Non-Acute Institutional Stay (SKILLED_NURSING_FACILITY): Payer: Medicare Other | Admitting: Internal Medicine

## 2020-08-14 DIAGNOSIS — F01518 Vascular dementia, unspecified severity, with other behavioral disturbance: Secondary | ICD-10-CM

## 2020-08-14 DIAGNOSIS — S32051S Stable burst fracture of fifth lumbar vertebra, sequela: Secondary | ICD-10-CM | POA: Diagnosis not present

## 2020-08-14 DIAGNOSIS — I480 Paroxysmal atrial fibrillation: Secondary | ICD-10-CM | POA: Diagnosis not present

## 2020-08-14 DIAGNOSIS — M810 Age-related osteoporosis without current pathological fracture: Secondary | ICD-10-CM

## 2020-08-14 DIAGNOSIS — F0151 Vascular dementia with behavioral disturbance: Secondary | ICD-10-CM

## 2020-08-14 DIAGNOSIS — F418 Other specified anxiety disorders: Secondary | ICD-10-CM

## 2020-08-14 DIAGNOSIS — M069 Rheumatoid arthritis, unspecified: Secondary | ICD-10-CM

## 2020-08-14 NOTE — Progress Notes (Signed)
Location:   [facility] Room Number: 50 Place of Service:  SNF [phone]) Provider:  Veleta Miners MD  Virgie Dad, MD  Patient Care Team: Virgie Dad, MD as PCP - General (Internal Medicine) Pichardo-Geisinger, Mila Palmer, MD as Referring Physician Nestor Ramp Effie Shy, MD as Referring Physician (Ophthalmology)  Extended Emergency Contact Information Primary Emergency Contact: Key, Tim Address: [address]          [place], [place] [address] [name] of [place] Phone: [phone] Relation: [name] Secondary Emergency Contact: Owens Shark Address: [address]          [place], [place] [address] [name] of [place] Phone: [phone] Mobile Phone: [phone] Relation: Other  Code Status:  Full Code Goals of care: Advanced Directive information Advanced Directives 07/22/2020  Does Patient Have a Medical Advance Directive? Yes  Type of Advance Directive Living will  Does patient want to make changes to medical advance directive? No - Patient declined  Copy of [place] in Chart? -  Would patient like information on creating a medical advance directive? -  Pre-existing out of facility DNR order (yellow form or pink MOST form) -     Chief Complaint  Patient presents with  . Acute Visit    Pain control and behavior    HPI:  Pt is a 86 y.o. female seen today for an acute visit for Pain Control and Screaming  Patient has a history ofPAF on Eliquis, GERD, Rheumatoid arthritis, Hyperlipidemia, Osteoporosis and Dementiawith Anxietyand Behavior issues Patient[place] living in [place] Was admitted in the hospital from 8/28-9/2 for nondisplaced fracture of L5 with after sustaining a mechanical fall  Since she has been here she has been screams even with little bit of movement. Per her niece and nurses it is hard to know if it is anxiety or pain but oxycodone does help her for some time but then she starts  screaming again. Niece also said that she has not been eating well.  She lays in the bed and does not want to get due to pain. Her weight seems to be stable so far. Patient is not able to give me much history.    Past Medical History:  Diagnosis Date  . Acid reflux disease   . Arthritis    rheumatoid  . Atrial fibrillation ([place])   . Back pain   . Bursitis of hip, right   . Cancer ([place])    skin  . Chest pain   . Confusion   . Constipation   . Fuchs' corneal dystrophy   . GERD (gastroesophageal reflux disease)   . Hard of hearing   . Knee pain   . Memory loss   . Paranoia ([place])   . Shoulder pain   . Venous insufficiency    Past Surgical History:  Procedure Laterality Date  . CORNEAL TRANSPLANT  2012   Dr Maudie Mercury   . RECONSTRUCTION OF EYELID     eyelid cancer 10 years ago Dr Victorino Dike   . RECONSTRUCTION OF NOSE     nose cancer/ Dr Nyoka Cowden 10 years ago     Allergies  Allergen Reactions  . Novocain [Procaine] Other (See Comments)    Unknown reaction  . Sulfamethoxazole-Trimethoprim Other (See Comments)    Unknown reaction  . Tape Other (See Comments)    Adhesive tape - unknown reaction  . Codeine Other (See Comments)    Unknown reaction  . Neosporin [Neomycin-Bacitracin Zn-Polymyx] Other (See Comments)  Unknown reaction    Allergies as of 08/14/2020      Reactions   Novocain [procaine] Other (See Comments)   Unknown reaction   Sulfamethoxazole-trimethoprim Other (See Comments)   Unknown reaction   Tape Other (See Comments)   Adhesive tape - unknown reaction   Codeine Other (See Comments)   Unknown reaction   Neosporin [neomycin-bacitracin Zn-polymyx] Other (See Comments)   Unknown reaction      Medication List       Accurate as of August 14, 2020  3:03 PM. If you have any questions, ask your nurse or doctor.        STOP taking these medications   diclofenac Sodium 1 % Gel Commonly known as: VOLTAREN Stopped by: Virgie Dad, MD     TAKE these  medications   acetaminophen 325 MG tablet Commonly known as: TYLENOL Take 650 mg by mouth in the morning, at noon, in the evening, and at bedtime.   busPIRone 5 MG tablet Commonly known as: BUSPAR Take 5 mg by mouth daily.   denosumab 60 MG/ML Sosy injection Commonly known as: PROLIA Inject 60 mg into the skin every 6 (six) months. On the 22nd of reach 6th month   Eliquis 2.5 MG Tabs tablet Generic drug: apixaban Take 2.5 mg by mouth 2 (two) times daily.   fluorouracil 5 % cream Commonly known as: EFUDEX Apply 1 application topically See admin instructions. Apply peasized amount topically to right temple, nose and right upper lip twice daily for 3 weeks   folic acid 1 MG tablet Commonly known as: FOLVITE Take 1 mg by mouth daily.   lidocaine 5 % Commonly known as: LIDODERM Place 1 patch onto the skin daily. Remove & Discard patch within 12 hours or as directed by MD   memantine 10 MG tablet Commonly known as: NAMENDA Take 10 mg by mouth 2 (two) times daily.   methocarbamol 500 MG tablet Commonly known as: ROBAXIN Take 1 tablet (500 mg total) by mouth every 8 (eight) hours as needed for muscle spasms.   methotrexate 2.5 MG tablet Commonly known as: RHEUMATREX Take 15 mg by mouth every Friday. Caution:Chemotherapy. Protect from light.   mineral oil-hydrophilic petrolatum ointment Apply 1 application topically 2 (two) times daily as needed for dry skin. Apply to forehead, legs, and right mid back   Oxycodone HCl 10 MG Tabs Take 10 mg by mouth at bedtime. What changed: Another medication with the same name was removed. Continue taking this medication, and follow the directions you see here. Changed by: Virgie Dad, MD   oxyCODONE 15 MG immediate release tablet Commonly known as: ROXICODONE Take 7.5 mg by mouth every 4 (four) hours as needed for pain. 1/2 tab What changed: Another medication with the same name was removed. Continue taking this medication, and follow  the directions you see here. Changed by: Virgie Dad, MD   pravastatin 20 MG tablet Commonly known as: PRAVACHOL Take 20 mg by mouth at bedtime.   prednisoLONE acetate 1 % ophthalmic suspension Commonly known as: PRED FORTE Place 1 drop into the right eye daily.   QUEtiapine 25 MG tablet Commonly known as: SEROQUEL Take 12.5 mg by mouth 2 (two) times daily.   Senexon-S 8.6-50 MG tablet Generic drug: senna-docusate Take 1 tablet by mouth daily as needed for mild constipation.   sertraline 50 MG tablet Commonly known as: ZOLOFT Take 50 mg by mouth daily.   sodium chloride 5 % ophthalmic solution Commonly known  as: MURO 128 Place 1 drop into both eyes 4 (four) times daily.   Systane 0.4-0.3 % Soln Generic drug: Polyethyl Glycol-Propyl Glycol Place 1 drop into both eyes in the morning and at bedtime.       Review of Systems  Unable to perform ROS: Dementia    Immunization History  Administered Date(s) Administered  . Influenza, High Dose Seasonal PF 08/25/2019  . Influenza-Unspecified 10/01/2014  . Moderna SARS-COVID-2 Vaccination 11/24/2019, 12/22/2019  . Pneumococcal Conjugate-13 10/21/2014  . Pneumococcal Polysaccharide-23 01/23/2018  . Td 04/18/2013  . Zoster 09/04/2012   Pertinent  Health Maintenance Due  Topic Date Due  . DEXA SCAN  Never done  . INFLUENZA VACCINE  06/22/2020  . PNA vac Low Risk Adult  Completed   No flowsheet data found. Functional Status Survey:    Vitals:   08/14/20 1417  BP: 128/70  Pulse: 69  Resp: 20  Temp: 98.4 F (36.9 C)  SpO2: 96%  Weight: 152 lb 8 oz (69.2 kg)  Height: 5\' 6"  (1.676 m)   Body mass index is 24.61 kg/m. Physical Exam Vitals reviewed.  Constitutional:      Appearance: Normal appearance.  HENT:     Head: Normocephalic.     Nose: Nose normal.     Mouth/Throat:     Mouth: Mucous membranes are moist.  Cardiovascular:     Rate and Rhythm: Normal rate and regular rhythm.     Pulses: Normal  pulses.  Pulmonary:     Effort: Pulmonary effort is normal.     Breath sounds: Normal breath sounds.  Abdominal:     General: Abdomen is flat. Bowel sounds are normal.     Palpations: Abdomen is soft.  Musculoskeletal:        General: No swelling.     Cervical back: Neck supple.  Skin:    General: Skin is warm.  Neurological:     General: No focal deficit present.     Mental Status: She is alert.  Psychiatric:        Mood and Affect: Mood normal.     Labs reviewed: Recent Labs    07/19/20 1900 07/20/20 0104 07/21/20 0342 07/31/20 0000  NA 139  --  140 139  K 3.9  --  4.1 4.2  CL 103  --  108 106  CO2 25  --  25 25*  GLUCOSE 127*  --  102*  --   BUN 17  --  23 19  CREATININE 0.99  --  1.07* 0.9  CALCIUM 10.6*  --  9.2 8.7  MG  --  2.2  --   --    Recent Labs    12/08/19 0000 04/24/20 0000 07/31/20 0000  AST 19 17 19   ALT 11 11 14   ALKPHOS 41 41 81  ALBUMIN  --   --  3.7   Recent Labs    07/19/20 1900 07/21/20 0342 07/31/20 0000  WBC 11.5* 8.0 8.7  NEUTROABS 9.4*  --  5,672  HGB 12.5 11.2* 11.6*  HCT 39.3 34.7* 35*  MCV 94.9 97.2  --   PLT 176 153 281   Lab Results  Component Value Date   TSH 2.45 04/24/2020   No results found for: HGBA1C Lab Results  Component Value Date   CHOL 141 12/08/2019   HDL 49 12/08/2019   LDLCALC 78 12/08/2019   TRIG 59 12/08/2019    Significant Diagnostic Results in last 30 days:  DG Chest 1 View  Result Date: 07/19/2020 CLINICAL DATA:  Status post fall. EXAM: CHEST  1 VIEW COMPARISON:  November 26, 2018 FINDINGS: Mild, chronic appearing diffusely increased lung markings are seen. There is no evidence of acute infiltrate, pleural effusion or pneumothorax. The heart size and mediastinal contours are within normal limits. There is marked severity calcification of the thoracic aorta. There is a chronic fracture deformity of the sixth right rib. An additional chronic deformity of the mid left clavicle is seen. Degenerative  changes are noted throughout the thoracic spine. IMPRESSION: Chronic appearing interstitial lung disease without evidence of acute or active cardiopulmonary disease. Electronically Signed   By: Virgina Norfolk M.D.   On: 07/19/2020 16:28   DG Lumbar Spine 2-3 Views  Result Date: 07/19/2020 CLINICAL DATA:  Unwitnessed fall.  Low back pain. Unable to stand. EXAM: LUMBAR SPINE - 2-3 VIEW COMPARISON:  None. FINDINGS: No evidence of acute fracture. Vertebral body heights are preserved. There is 5 mm anterolisthesis of L4 on L5 that is likely degenerative. Disc space narrowing and endplate spurring most prominent at L4-L5 and L5-S1. bones are diffusely under mineralized. Multilevel facet hypertrophy. The sacroiliac joints are congruent with mild degenerative change. Aortic atherosclerosis. IMPRESSION: 1. Multilevel degenerative disc disease and facet hypertrophy in the lumbar spine. No evidence of acute fracture. 2. Grade 1 anterolisthesis of L4 on L5, likely degenerative. Electronically Signed   By: Keith Rake M.D.   On: 07/19/2020 16:31   DG Pelvis 1-2 Views  Result Date: 07/19/2020 CLINICAL DATA:  Unwitnessed fall.  Low back pain.  Unable to stand. EXAM: PELVIS - 1-2 VIEW COMPARISON:  None. FINDINGS: The cortical margins of the bony pelvis are intact. No fracture. Pubic symphysis and sacroiliac joints are congruent. Portions of the pubic rami are partially obscured by overlying stool/bowel gas. Both femoral heads are well-seated in the respective acetabula. Moderate bilateral hip osteoarthritis with joint space narrowing. IMPRESSION: 1. No pelvic fracture. 2. Moderate bilateral hip osteoarthritis. Electronically Signed   By: Keith Rake M.D.   On: 07/19/2020 16:29   CT Head Wo Contrast  Result Date: 07/19/2020 CLINICAL DATA:  Unwitnessed fall.  On Eliquis. EXAM: CT HEAD WITHOUT CONTRAST CT CERVICAL SPINE WITHOUT CONTRAST TECHNIQUE: Multidetector CT imaging of the head and cervical spine was  performed following the standard protocol without intravenous contrast. Multiplanar CT image reconstructions of the cervical spine were also generated. COMPARISON:  MRI brain dated May 28, 2017. CT head and cervical spine dated March 11, 2016. FINDINGS: CT HEAD FINDINGS Brain: No evidence of acute infarction, hemorrhage, hydrocephalus, extra-axial collection or mass lesion/mass effect. Stable atrophy and chronic microvascular ischemic changes. Slightly asymmetric hyperdensity near the left hippocampus and temporal horn is favored to represent choroid plexus. Vascular: Atherosclerotic vascular calcification of the carotid siphons. No hyperdense vessel. Skull: Normal. Negative for fracture or focal lesion. Sinuses/Orbits: Small air-fluid level in the right sphenoid sinus. Frothy secretions a left posterior ethmoid air cell. The orbits are unremarkable. Other: None. CT CERVICAL SPINE FINDINGS Alignment: No traumatic malalignment. Unchanged trace anterolisthesis at C4-C5 and C7-T1. Skull base and vertebrae: No acute fracture. No primary bone lesion or focal pathologic process. Soft tissues and spinal canal: No prevertebral fluid or swelling. No visible canal hematoma. Disc levels: Multilevel disc height loss, severe at C4-C5 and C5-C6. Moderate to severe facet uncovertebral hypertrophy throughout the cervical spine. Findings are similar to prior study Upper chest: Biapical pleuroparenchymal scarring. Other: Secretions in the trachea. IMPRESSION: 1. No acute intracranial abnormality. Stable atrophy and chronic  microvascular ischemic changes. 2. No acute cervical spine fracture or traumatic malalignment. 3. Secretions in the trachea. Correlate for aspiration. Electronically Signed   By: Titus Dubin M.D.   On: 07/19/2020 16:17   CT Cervical Spine Wo Contrast  Result Date: 07/19/2020 CLINICAL DATA:  Unwitnessed fall.  On Eliquis. EXAM: CT HEAD WITHOUT CONTRAST CT CERVICAL SPINE WITHOUT CONTRAST TECHNIQUE:  Multidetector CT imaging of the head and cervical spine was performed following the standard protocol without intravenous contrast. Multiplanar CT image reconstructions of the cervical spine were also generated. COMPARISON:  MRI brain dated May 28, 2017. CT head and cervical spine dated March 11, 2016. FINDINGS: CT HEAD FINDINGS Brain: No evidence of acute infarction, hemorrhage, hydrocephalus, extra-axial collection or mass lesion/mass effect. Stable atrophy and chronic microvascular ischemic changes. Slightly asymmetric hyperdensity near the left hippocampus and temporal horn is favored to represent choroid plexus. Vascular: Atherosclerotic vascular calcification of the carotid siphons. No hyperdense vessel. Skull: Normal. Negative for fracture or focal lesion. Sinuses/Orbits: Small air-fluid level in the right sphenoid sinus. Frothy secretions a left posterior ethmoid air cell. The orbits are unremarkable. Other: None. CT CERVICAL SPINE FINDINGS Alignment: No traumatic malalignment. Unchanged trace anterolisthesis at C4-C5 and C7-T1. Skull base and vertebrae: No acute fracture. No primary bone lesion or focal pathologic process. Soft tissues and spinal canal: No prevertebral fluid or swelling. No visible canal hematoma. Disc levels: Multilevel disc height loss, severe at C4-C5 and C5-C6. Moderate to severe facet uncovertebral hypertrophy throughout the cervical spine. Findings are similar to prior study Upper chest: Biapical pleuroparenchymal scarring. Other: Secretions in the trachea. IMPRESSION: 1. No acute intracranial abnormality. Stable atrophy and chronic microvascular ischemic changes. 2. No acute cervical spine fracture or traumatic malalignment. 3. Secretions in the trachea. Correlate for aspiration. Electronically Signed   By: Titus Dubin M.D.   On: 07/19/2020 16:17   CT Lumbar Spine Wo Contrast  Result Date: 07/19/2020 CLINICAL DATA:  Fall.  Back pain. EXAM: CT LUMBAR SPINE WITHOUT CONTRAST  TECHNIQUE: Multidetector CT imaging of the lumbar spine was performed without intravenous contrast administration. Multiplanar CT image reconstructions were also generated. COMPARISON:  Lumbar radiographs 07/19/2020 FINDINGS: Segmentation: Normal Alignment: Mild anterolisthesis L2-3, L3-4, L4-5 Vertebrae: Nondisplaced acute fractures of the L5 transverse process bilaterally. No vertebral body fracture. Paraspinal and other soft tissues: Atherosclerotic calcification aorta and iliac arteries without aneurysm. No paraspinous mass or adenopathy. Right renal cyst. Disc levels: T11-12: Moderate disc degeneration and spurring. Moderate foraminal narrowing bilaterally due to spurring T12-L1: Mild disc degeneration.  Negative for stenosis. L1-2: Disc degeneration and mild spurring. No significant stenosis. Mild facet degeneration. L2-3: Disc degeneration with diffuse endplate spurring. Bilateral facet degeneration. Mild spinal stenosis. Moderate subarticular stenosis on the left and mild subarticular stenosis on the right. L3-4: Disc degeneration with disc bulging. Bilateral facet hypertrophy. Moderate subarticular stenosis on the right. Spinal canal adequate in size L4-5: Broad-based central disc protrusion with diffuse endplate spurring and bilateral facet degeneration. Moderate subarticular stenosis bilaterally L5-S1: Mild disc and mild facet degeneration. No significant stenosis. IMPRESSION: Nondisplaced fractures of the L5 transverse process bilaterally. These appear acute. No fracture of the vertebral body. Multilevel degenerative change throughout the lumbar spine as above. Electronically Signed   By: Franchot Gallo M.D.   On: 07/19/2020 20:12    Assessment/Plan Closed stable burst fracture of fifth lumbar vertebra, sequela Increased Oxycodone to 10 mg BID and Q 4 PRN for Pain Continue Robaxin PRN Poor appetite ? Constipation has not had Bowel movement  for past few days Will start on Senna and Miralax If no  improvement will need imaging Though Exam is Benign right now  Other issues  PAF (paroxysmal atrial fibrillation) ([facility]) Rate controlled on Eliquis Age-related osteoporosis without current pathological fracture Continue on Prolia Rheumatoid arthritis, involving unspecified site, unspecified whether rheumatoid factor present ([place]) On methotrexate Depression with anxiety Doing well on Zoloft Vascular dementia with behavior disturbance ([facility]) Continue Aricept Namenda and BuSpar  Hyperlipidemia, unspecified hyperlipidemia type On statin LDL 78       Family/ staff Communication:   Labs/tests ordered:

## 2020-08-15 ENCOUNTER — Other Ambulatory Visit: Payer: Self-pay | Admitting: Internal Medicine

## 2020-08-15 MED ORDER — OXYCODONE HCL 10 MG PO TABS
10.0000 mg | ORAL_TABLET | ORAL | 0 refills | Status: DC | PRN
Start: 2020-08-15 — End: 2020-08-28

## 2020-08-15 NOTE — Progress Notes (Unsigned)
Location:     Place of Service:     Provider:   Code Status: *** Goals of Care:  Advanced Directives 07/22/2020  Does Patient Have a Medical Advance Directive? Yes  Type of Advance Directive Living will  Does patient want to make changes to medical advance directive? No - Patient declined  Copy of Hydro in Chart? -  Would patient like information on creating a medical advance directive? -  Pre-existing out of facility DNR order (yellow form or pink MOST form) -     No chief complaint on file.   HPI: Patient is a 86 y.o. female seen today for an acute visit for  Past Medical History:  Diagnosis Date  . Acid reflux disease   . Arthritis    rheumatoid  . Atrial fibrillation ([place])   . Back pain   . Bursitis of hip, right   . Cancer ([place])    skin  . Chest pain   . Confusion   . Constipation   . Fuchs' corneal dystrophy   . GERD (gastroesophageal reflux disease)   . Hard of hearing   . Knee pain   . Memory loss   . Paranoia ([place])   . Shoulder pain   . Venous insufficiency     Past Surgical History:  Procedure Laterality Date  . CORNEAL TRANSPLANT  2012   Dr Maudie Mercury   . RECONSTRUCTION OF EYELID     eyelid cancer 10 years ago Dr Victorino Dike   . RECONSTRUCTION OF NOSE     nose cancer/ Dr Nyoka Cowden 10 years ago     Allergies  Allergen Reactions  . Novocain [Procaine] Other (See Comments)    Unknown reaction  . Sulfamethoxazole-Trimethoprim Other (See Comments)    Unknown reaction  . Tape Other (See Comments)    Adhesive tape - unknown reaction  . Codeine Other (See Comments)    Unknown reaction  . Neosporin [Neomycin-Bacitracin Zn-Polymyx] Other (See Comments)    Unknown reaction    Outpatient Encounter Medications as of 08/15/2020  Medication Sig  . acetaminophen (TYLENOL) 325 MG tablet Take 650 mg by mouth in the morning, at noon, in the evening, and at bedtime.  Marland Kitchen apixaban (ELIQUIS) 2.5 MG TABS tablet Take 2.5 mg by mouth 2 (two)  times daily.  . busPIRone (BUSPAR) 5 MG tablet Take 5 mg by mouth daily.   Marland Kitchen denosumab (PROLIA) 60 MG/ML SOSY injection Inject 60 mg into the skin every 6 (six) months. On the 22nd of reach 6th month  . fluorouracil (EFUDEX) 5 % cream Apply 1 application topically See admin instructions. Apply peasized amount topically to right temple, nose and right upper lip twice daily for 3 weeks  . folic acid (FOLVITE) 1 MG tablet Take 1 mg by mouth daily.  Marland Kitchen lidocaine (LIDODERM) 5 % Place 1 patch onto the skin daily. Remove & Discard patch within 12 hours or as directed by MD  . memantine (NAMENDA) 10 MG tablet Take 10 mg by mouth 2 (two) times daily.   . methocarbamol (ROBAXIN) 500 MG tablet Take 1 tablet (500 mg total) by mouth every 8 (eight) hours as needed for muscle spasms.  . methotrexate (RHEUMATREX) 2.5 MG tablet Take 15 mg by mouth every Friday. Caution:Chemotherapy. Protect from light.  . mineral oil-hydrophilic petrolatum (AQUAPHOR) ointment Apply 1 application topically 2 (two) times daily as needed for dry skin. Apply to forehead, legs, and right mid back  . Oxycodone HCl 10 MG TABS  Take 10 mg by mouth at bedtime.  . Oxycodone HCl 10 MG TABS Take 1 tablet (10 mg total) by mouth every 4 (four) hours as needed.  Vladimir Faster Glycol-Propyl Glycol (SYSTANE) 0.4-0.3 % SOLN Place 1 drop into both eyes in the morning and at bedtime.   . pravastatin (PRAVACHOL) 20 MG tablet Take 20 mg by mouth at bedtime.   . prednisoLONE acetate (PRED FORTE) 1 % ophthalmic suspension Place 1 drop into the right eye daily.  . QUEtiapine (SEROQUEL) 25 MG tablet Take 12.5 mg by mouth 2 (two) times daily.  Marland Kitchen senna-docusate (SENEXON-S) 8.6-50 MG tablet Take 1 tablet by mouth daily as needed for mild constipation.  . sertraline (ZOLOFT) 50 MG tablet Take 50 mg by mouth daily.   . sodium chloride (MURO 128) 5 % ophthalmic solution Place 1 drop into both eyes 4 (four) times daily.  . [DISCONTINUED] oxyCODONE (ROXICODONE) 15 MG  immediate release tablet Take 7.5 mg by mouth every 4 (four) hours as needed for pain. 1/2 tab   No facility-administered encounter medications on file as of 08/15/2020.    Review of Systems:  Review of Systems  Health Maintenance  Topic Date Due  . DEXA SCAN  Never done  . INFLUENZA VACCINE  06/22/2020  . TETANUS/TDAP  04/19/2023  . COVID-19 Vaccine  Completed  . PNA vac Low Risk Adult  Completed    Physical Exam: There were no vitals filed for this visit. There is no height or weight on file to calculate BMI. Physical Exam  Labs reviewed: Basic Metabolic Panel: Recent Labs    04/24/20 0000 04/24/20 0000 07/19/20 1900 07/20/20 0104 07/21/20 0342 07/31/20 0000  NA 142  --  139  --  140 139  K 4.2   < > 3.9  --  4.1 4.2  CL 108   < > 103  --  108 106  CO2 29*   < > 25  --  25 25*  GLUCOSE  --   --  127*  --  102*  --   BUN 13  --  17  --  23 19  CREATININE 1.0  --  0.99  --  1.07* 0.9  CALCIUM 9.5   < > 10.6*  --  9.2 8.7  MG  --   --   --  2.2  --   --   TSH 2.45  --   --   --   --   --    < > = values in this interval not displayed.   Liver Function Tests: Recent Labs    12/08/19 0000 04/24/20 0000 07/31/20 0000  AST 19 17 19   ALT 11 11 14   ALKPHOS 41 41 81  ALBUMIN  --   --  3.7   No results for input(s): LIPASE, AMYLASE in the last 8760 hours. No results for input(s): AMMONIA in the last 8760 hours. CBC: Recent Labs    07/19/20 1900 07/21/20 0342 07/31/20 0000  WBC 11.5* 8.0 8.7  NEUTROABS 9.4*  --  5,672  HGB 12.5 11.2* 11.6*  HCT 39.3 34.7* 35*  MCV 94.9 97.2  --   PLT 176 153 281   Lipid Panel: Recent Labs    12/08/19 0000  CHOL 141  HDL 49  LDLCALC 78  TRIG 59   No results found for: HGBA1C  Procedures since last visit: DG Chest 1 View  Result Date: 07/19/2020 CLINICAL DATA:  Status post fall. EXAM: CHEST  1  VIEW COMPARISON:  November 26, 2018 FINDINGS: Mild, chronic appearing diffusely increased lung markings are seen. There is  no evidence of acute infiltrate, pleural effusion or pneumothorax. The heart size and mediastinal contours are within normal limits. There is marked severity calcification of the thoracic aorta. There is a chronic fracture deformity of the sixth right rib. An additional chronic deformity of the mid left clavicle is seen. Degenerative changes are noted throughout the thoracic spine. IMPRESSION: Chronic appearing interstitial lung disease without evidence of acute or active cardiopulmonary disease. Electronically Signed   By: Virgina Norfolk M.D.   On: 07/19/2020 16:28   DG Lumbar Spine 2-3 Views  Result Date: 07/19/2020 CLINICAL DATA:  Unwitnessed fall.  Low back pain. Unable to stand. EXAM: LUMBAR SPINE - 2-3 VIEW COMPARISON:  None. FINDINGS: No evidence of acute fracture. Vertebral body heights are preserved. There is 5 mm anterolisthesis of L4 on L5 that is likely degenerative. Disc space narrowing and endplate spurring most prominent at L4-L5 and L5-S1. bones are diffusely under mineralized. Multilevel facet hypertrophy. The sacroiliac joints are congruent with mild degenerative change. Aortic atherosclerosis. IMPRESSION: 1. Multilevel degenerative disc disease and facet hypertrophy in the lumbar spine. No evidence of acute fracture. 2. Grade 1 anterolisthesis of L4 on L5, likely degenerative. Electronically Signed   By: Keith Rake M.D.   On: 07/19/2020 16:31   DG Pelvis 1-2 Views  Result Date: 07/19/2020 CLINICAL DATA:  Unwitnessed fall.  Low back pain.  Unable to stand. EXAM: PELVIS - 1-2 VIEW COMPARISON:  None. FINDINGS: The cortical margins of the bony pelvis are intact. No fracture. Pubic symphysis and sacroiliac joints are congruent. Portions of the pubic rami are partially obscured by overlying stool/bowel gas. Both femoral heads are well-seated in the respective acetabula. Moderate bilateral hip osteoarthritis with joint space narrowing. IMPRESSION: 1. No pelvic fracture. 2. Moderate  bilateral hip osteoarthritis. Electronically Signed   By: Keith Rake M.D.   On: 07/19/2020 16:29   CT Head Wo Contrast  Result Date: 07/19/2020 CLINICAL DATA:  Unwitnessed fall.  On Eliquis. EXAM: CT HEAD WITHOUT CONTRAST CT CERVICAL SPINE WITHOUT CONTRAST TECHNIQUE: Multidetector CT imaging of the head and cervical spine was performed following the standard protocol without intravenous contrast. Multiplanar CT image reconstructions of the cervical spine were also generated. COMPARISON:  MRI brain dated May 28, 2017. CT head and cervical spine dated March 11, 2016. FINDINGS: CT HEAD FINDINGS Brain: No evidence of acute infarction, hemorrhage, hydrocephalus, extra-axial collection or mass lesion/mass effect. Stable atrophy and chronic microvascular ischemic changes. Slightly asymmetric hyperdensity near the left hippocampus and temporal horn is favored to represent choroid plexus. Vascular: Atherosclerotic vascular calcification of the carotid siphons. No hyperdense vessel. Skull: Normal. Negative for fracture or focal lesion. Sinuses/Orbits: Small air-fluid level in the right sphenoid sinus. Frothy secretions a left posterior ethmoid air cell. The orbits are unremarkable. Other: None. CT CERVICAL SPINE FINDINGS Alignment: No traumatic malalignment. Unchanged trace anterolisthesis at C4-C5 and C7-T1. Skull base and vertebrae: No acute fracture. No primary bone lesion or focal pathologic process. Soft tissues and spinal canal: No prevertebral fluid or swelling. No visible canal hematoma. Disc levels: Multilevel disc height loss, severe at C4-C5 and C5-C6. Moderate to severe facet uncovertebral hypertrophy throughout the cervical spine. Findings are similar to prior study Upper chest: Biapical pleuroparenchymal scarring. Other: Secretions in the trachea. IMPRESSION: 1. No acute intracranial abnormality. Stable atrophy and chronic microvascular ischemic changes. 2. No acute cervical spine fracture or traumatic  malalignment.  3. Secretions in the trachea. Correlate for aspiration. Electronically Signed   By: Titus Dubin M.D.   On: 07/19/2020 16:17   CT Cervical Spine Wo Contrast  Result Date: 07/19/2020 CLINICAL DATA:  Unwitnessed fall.  On Eliquis. EXAM: CT HEAD WITHOUT CONTRAST CT CERVICAL SPINE WITHOUT CONTRAST TECHNIQUE: Multidetector CT imaging of the head and cervical spine was performed following the standard protocol without intravenous contrast. Multiplanar CT image reconstructions of the cervical spine were also generated. COMPARISON:  MRI brain dated May 28, 2017. CT head and cervical spine dated March 11, 2016. FINDINGS: CT HEAD FINDINGS Brain: No evidence of acute infarction, hemorrhage, hydrocephalus, extra-axial collection or mass lesion/mass effect. Stable atrophy and chronic microvascular ischemic changes. Slightly asymmetric hyperdensity near the left hippocampus and temporal horn is favored to represent choroid plexus. Vascular: Atherosclerotic vascular calcification of the carotid siphons. No hyperdense vessel. Skull: Normal. Negative for fracture or focal lesion. Sinuses/Orbits: Small air-fluid level in the right sphenoid sinus. Frothy secretions a left posterior ethmoid air cell. The orbits are unremarkable. Other: None. CT CERVICAL SPINE FINDINGS Alignment: No traumatic malalignment. Unchanged trace anterolisthesis at C4-C5 and C7-T1. Skull base and vertebrae: No acute fracture. No primary bone lesion or focal pathologic process. Soft tissues and spinal canal: No prevertebral fluid or swelling. No visible canal hematoma. Disc levels: Multilevel disc height loss, severe at C4-C5 and C5-C6. Moderate to severe facet uncovertebral hypertrophy throughout the cervical spine. Findings are similar to prior study Upper chest: Biapical pleuroparenchymal scarring. Other: Secretions in the trachea. IMPRESSION: 1. No acute intracranial abnormality. Stable atrophy and chronic microvascular ischemic changes.  2. No acute cervical spine fracture or traumatic malalignment. 3. Secretions in the trachea. Correlate for aspiration. Electronically Signed   By: Titus Dubin M.D.   On: 07/19/2020 16:17   CT Lumbar Spine Wo Contrast  Result Date: 07/19/2020 CLINICAL DATA:  Fall.  Back pain. EXAM: CT LUMBAR SPINE WITHOUT CONTRAST TECHNIQUE: Multidetector CT imaging of the lumbar spine was performed without intravenous contrast administration. Multiplanar CT image reconstructions were also generated. COMPARISON:  Lumbar radiographs 07/19/2020 FINDINGS: Segmentation: Normal Alignment: Mild anterolisthesis L2-3, L3-4, L4-5 Vertebrae: Nondisplaced acute fractures of the L5 transverse process bilaterally. No vertebral body fracture. Paraspinal and other soft tissues: Atherosclerotic calcification aorta and iliac arteries without aneurysm. No paraspinous mass or adenopathy. Right renal cyst. Disc levels: T11-12: Moderate disc degeneration and spurring. Moderate foraminal narrowing bilaterally due to spurring T12-L1: Mild disc degeneration.  Negative for stenosis. L1-2: Disc degeneration and mild spurring. No significant stenosis. Mild facet degeneration. L2-3: Disc degeneration with diffuse endplate spurring. Bilateral facet degeneration. Mild spinal stenosis. Moderate subarticular stenosis on the left and mild subarticular stenosis on the right. L3-4: Disc degeneration with disc bulging. Bilateral facet hypertrophy. Moderate subarticular stenosis on the right. Spinal canal adequate in size L4-5: Broad-based central disc protrusion with diffuse endplate spurring and bilateral facet degeneration. Moderate subarticular stenosis bilaterally L5-S1: Mild disc and mild facet degeneration. No significant stenosis. IMPRESSION: Nondisplaced fractures of the L5 transverse process bilaterally. These appear acute. No fracture of the vertebral body. Multilevel degenerative change throughout the lumbar spine as above. Electronically Signed    By: Franchot Gallo M.D.   On: 07/19/2020 20:12    Assessment/Plan There are no diagnoses linked to this encounter.   Labs/tests ordered:  * No order type specified * Next appt:  Visit date not found

## 2020-08-28 ENCOUNTER — Encounter: Payer: Self-pay | Admitting: Nurse Practitioner

## 2020-08-28 ENCOUNTER — Non-Acute Institutional Stay (SKILLED_NURSING_FACILITY): Payer: Medicare Other | Admitting: Nurse Practitioner

## 2020-08-28 DIAGNOSIS — S32051S Stable burst fracture of fifth lumbar vertebra, sequela: Secondary | ICD-10-CM

## 2020-08-28 DIAGNOSIS — K5901 Slow transit constipation: Secondary | ICD-10-CM | POA: Diagnosis not present

## 2020-08-28 DIAGNOSIS — F418 Other specified anxiety disorders: Secondary | ICD-10-CM

## 2020-08-28 DIAGNOSIS — F0150 Vascular dementia without behavioral disturbance: Secondary | ICD-10-CM | POA: Diagnosis not present

## 2020-08-28 DIAGNOSIS — K4090 Unilateral inguinal hernia, without obstruction or gangrene, not specified as recurrent: Secondary | ICD-10-CM

## 2020-08-28 DIAGNOSIS — I480 Paroxysmal atrial fibrillation: Secondary | ICD-10-CM

## 2020-08-28 DIAGNOSIS — M069 Rheumatoid arthritis, unspecified: Secondary | ICD-10-CM

## 2020-08-28 DIAGNOSIS — M810 Age-related osteoporosis without current pathological fracture: Secondary | ICD-10-CM

## 2020-08-28 NOTE — Assessment & Plan Note (Signed)
Hospitalized 07/19/20-07/24/20 for non displaced fx of L5 sustained from fall, takes Oxycodone 10mg  bid/prn q4h, prn Robaxin

## 2020-08-28 NOTE — Assessment & Plan Note (Signed)
Constipation, takes Senna, MiraLax °

## 2020-08-28 NOTE — Assessment & Plan Note (Addendum)
reported a bulge on top of R vaginal area, no pain or bruise noted. 02/21/20 HPOA declined surgical consultation.

## 2020-08-28 NOTE — Assessment & Plan Note (Signed)
Afib, takes Eliquis 2.5mg bid  

## 2020-08-28 NOTE — Assessment & Plan Note (Signed)
OP, takes Prolia, Ca, Vit D   

## 2020-08-28 NOTE — Assessment & Plan Note (Signed)
RA, takes Methotrexate 15mg wkly 

## 2020-08-28 NOTE — Assessment & Plan Note (Signed)
Depression/anxiety, takes Sertraline 25mg qd, Buspar  

## 2020-08-28 NOTE — Progress Notes (Signed)
Location:   [facility] Room Number: 50 Place of Service:  SNF (31) Provider: Lennie Odor Tiyah Zelenak NP  Virgie Dad, MD  Patient Care Team: Virgie Dad, MD as PCP - General (Internal Medicine) Pichardo-Geisinger, Mila Palmer, MD as Referring Physician Nestor Ramp Effie Shy, MD as Referring Physician (Ophthalmology)  Extended Emergency Contact Information Primary Emergency Contact: Key, Tim Address: [address]          [place], [place] [address] [name] of [place] Phone: [phone] Relation: [name] Secondary Emergency Contact: Owens Shark Address: [address]          [place], [place] [address] [name] of [place] Phone: [phone] Mobile Phone: [phone] Relation: Other  Code Status: DNR Goals of care: Advanced Directive information Advanced Directives 07/22/2020  Does Patient Have a Medical Advance Directive? Yes  Type of Advance Directive Living will  Does patient want to make changes to medical advance directive? No - Patient declined  Copy of [place] in Chart? -  Would patient like information on creating a medical advance directive? -  Pre-existing out of facility DNR order (yellow form or pink MOST form) -     Chief Complaint  Patient presents with   Acute Visit    Bulge on top R vaginal area    HPI:  Pt is a 86 y.o. female seen today for an acute visit for reported a bulge on top of R vaginal area, no pain or bruise noted.   Hospitalized 07/19/20-07/24/20 for non displaced fx of L5 sustained from fall, takes Oxycodone 10mg  bid/prn q4h, prn Robaxin  Constipation, takes Senna, MiraLax   Dementia,  takes Memantine 10mg  bid                Afib, takes Eliquis 2.5mg  bid             Depression/anxiety, takes Sertraline 25mg  qd, Buspar             RA, takes Methotrexate 15mg  wkly             OP, takes Prolia, Ca, Vit D  Past Medical History:  Diagnosis Date   Acid reflux disease     Arthritis    rheumatoid   Atrial fibrillation (HCC)    Back pain    Bursitis of hip, right    Cancer (HCC)    skin   Chest pain    Confusion    Constipation    Fuchs' corneal dystrophy    GERD (gastroesophageal reflux disease)    Hard of hearing    Knee pain    Memory loss    Paranoia (HCC)    Shoulder pain    Venous insufficiency    Past Surgical History:  Procedure Laterality Date   CORNEAL TRANSPLANT  2012   Dr Maudie Mercury    RECONSTRUCTION OF EYELID     eyelid cancer 10 years ago Dr Victorino Dike    RECONSTRUCTION OF NOSE     nose cancer/ Dr Nyoka Cowden 10 years ago     Allergies  Allergen Reactions   Novocain [Procaine] Other (See Comments)    Unknown reaction   Sulfamethoxazole-Trimethoprim Other (See Comments)    Unknown reaction   Tape Other (See Comments)    Adhesive tape - unknown reaction   Codeine Other (See Comments)    Unknown reaction   Neosporin [Neomycin-Bacitracin Zn-Polymyx] Other (See Comments)    Unknown reaction    Allergies as of 08/28/2020  Reactions   Novocain [procaine] Other (See Comments)   Unknown reaction   Sulfamethoxazole-trimethoprim Other (See Comments)   Unknown reaction   Tape Other (See Comments)   Adhesive tape - unknown reaction   Codeine Other (See Comments)   Unknown reaction   Neosporin [neomycin-bacitracin Zn-polymyx] Other (See Comments)   Unknown reaction      Medication List       Accurate as of August 28, 2020  4:00 PM. If you have any questions, ask your nurse or doctor.        STOP taking these medications   fluorouracil 5 % cream Commonly known as: EFUDEX Stopped by: Kent Braunschweig X Aizley Stenseth, NP     TAKE these medications   acetaminophen 325 MG tablet Commonly known as: TYLENOL Take 650 mg by mouth in the morning, at noon, in the evening, and at bedtime.   busPIRone 5 MG tablet Commonly known as: BUSPAR Take 5 mg by mouth daily.   denosumab 60 MG/ML Sosy injection Commonly known as:  PROLIA Inject 60 mg into the skin every 6 (six) months. On the 22nd of reach 6th month   Eliquis 2.5 MG Tabs tablet Generic drug: apixaban Take 2.5 mg by mouth 2 (two) times daily.   folic acid 1 MG tablet Commonly known as: FOLVITE Take 1 mg by mouth daily.   Lidocaine Pain Relief 4 % Ptch Generic drug: Lidocaine Apply topically. Apply to lower back Q 24hrs Once A Day   lidocaine 5 % Commonly known as: LIDODERM Place 1 patch onto the skin daily. Remove & Discard patch within 12 hours or as directed by MD   memantine 10 MG tablet Commonly known as: NAMENDA Take 10 mg by mouth 2 (two) times daily.   methocarbamol 500 MG tablet Commonly known as: ROBAXIN Take 1 tablet (500 mg total) by mouth every 8 (eight) hours as needed for muscle spasms.   methotrexate 2.5 MG tablet Commonly known as: RHEUMATREX Take 15 mg by mouth every Friday. Caution:Chemotherapy. Protect from light.   mineral oil-hydrophilic petrolatum ointment Apply 1 application topically 2 (two) times daily as needed for dry skin. Apply to forehead, legs, and right mid back   ofloxacin 0.3 % ophthalmic solution Commonly known as: OCUFLOX 1 drop 4 (four) times daily.   Oxycodone HCl 10 MG Tabs Take 10 mg by mouth 2 (two) times daily. What changed: Another medication with the same name was removed. Continue taking this medication, and follow the directions you see here. Changed by: Farron Lafond X Yolande Skoda, NP   Oxycodone HCl 10 MG Tabs Take 10 mg by mouth every 6 (six) hours as needed. What changed: Another medication with the same name was removed. Continue taking this medication, and follow the directions you see here. Changed by: Rien Marland X Marta Bouie, NP   pravastatin 20 MG tablet Commonly known as: PRAVACHOL Take 20 mg by mouth at bedtime.   prednisoLONE acetate 1 % ophthalmic suspension Commonly known as: PRED FORTE Place 1 drop into the right eye daily.   QUEtiapine 25 MG tablet Commonly known as: SEROQUEL Take 12.5  mg by mouth 2 (two) times daily.   Senexon-S 8.6-50 MG tablet Generic drug: senna-docusate Take 1 tablet by mouth daily as needed for mild constipation.   sertraline 50 MG tablet Commonly known as: ZOLOFT Take 50 mg by mouth daily.   sodium chloride 5 % ophthalmic solution Commonly known as: MURO 128 Place 1 drop into both eyes 4 (four) times daily.   Systane 0.4-0.3 %  Soln Generic drug: Polyethyl Glycol-Propyl Glycol Place 1 drop into both eyes in the morning and at bedtime.       Review of Systems  Constitutional: Negative for appetite change, fatigue and fever.  HENT: Positive for hearing loss. Negative for congestion and voice change.   Eyes: Negative for visual disturbance.  Respiratory: Negative for cough and shortness of breath.   Cardiovascular: Positive for leg swelling. Negative for palpitations.  Gastrointestinal: Negative for abdominal pain, constipation, nausea and vomiting.  Genitourinary: Negative for difficulty urinating, dysuria and urgency.  Musculoskeletal: Positive for arthralgias, back pain and gait problem.  Skin: Negative for color change.  Neurological: Negative for speech difficulty, weakness, light-headedness and headaches.       Dementia  Psychiatric/Behavioral: Positive for confusion. Negative for behavioral problems and sleep disturbance. The patient is not nervous/anxious.     Immunization History  Administered Date(s) Administered   Influenza, High Dose Seasonal PF 08/25/2019   Influenza-Unspecified 10/01/2014   Moderna SARS-COVID-2 Vaccination 11/24/2019, 12/22/2019   Pneumococcal Conjugate-13 10/21/2014   Pneumococcal Polysaccharide-23 01/23/2018   Td 04/18/2013   Zoster 09/04/2012   Pertinent  Health Maintenance Due  Topic Date Due   DEXA SCAN  Never done   INFLUENZA VACCINE  06/22/2020   PNA vac Low Risk Adult  Completed   No flowsheet data found. Functional Status Survey:    Vitals:   08/28/20 1427  BP: 138/74   Pulse: 80  Resp: 20  Temp: 98.1 F (36.7 C)  SpO2: 96%  Weight: 152 lb 8 oz (69.2 kg)  Height: 5\' 6"  (1.676 m)   Body mass index is 24.61 kg/m. Physical Exam Vitals and nursing note reviewed.  Constitutional:      Appearance: Normal appearance.  HENT:     Head: Normocephalic and atraumatic.     Mouth/Throat:     Mouth: Mucous membranes are moist.  Eyes:     Conjunctiva/sclera: Conjunctivae normal.     Pupils: Pupils are equal, round, and reactive to light.  Cardiovascular:     Rate and Rhythm: Normal rate and regular rhythm.  Pulmonary:     Effort: Pulmonary effort is normal.     Breath sounds: No rales.  Abdominal:     General: Bowel sounds are normal. There is no distension.     Palpations: Abdomen is soft.     Tenderness: There is no abdominal tenderness. There is no right CVA tenderness, left CVA tenderness, guarding or rebound.     Hernia: A hernia is present.     Comments: Right inguinal hernia, reducible, a tennis ball sized   Musculoskeletal:        General: Tenderness present.     Cervical back: Normal range of motion and neck supple.     Right lower leg: Edema present.     Left lower leg: Edema present.     Comments: Lower back pain, bed rest most of time  Skin:    General: Skin is warm and dry.  Neurological:     General: No focal deficit present.     Mental Status: She is alert. Mental status is at baseline.     Motor: No weakness.     Coordination: Coordination normal.     Gait: Gait abnormal.     Comments: Oriented to person, place.   Psychiatric:        Mood and Affect: Mood normal.        Behavior: Behavior normal.     Comments: Pleasantly confused.  Labs reviewed: Recent Labs    07/19/20 1900 07/20/20 0104 07/21/20 0342 07/31/20 0000  NA 139  --  140 139  K 3.9  --  4.1 4.2  CL 103  --  108 106  CO2 25  --  25 25*  GLUCOSE 127*  --  102*  --   BUN 17  --  23 19  CREATININE 0.99  --  1.07* 0.9  CALCIUM 10.6*  --  9.2 8.7  MG   --  2.2  --   --    Recent Labs    12/08/19 0000 04/24/20 0000 07/31/20 0000  AST 19 17 19   ALT 11 11 14   ALKPHOS 41 41 81  ALBUMIN  --   --  3.7   Recent Labs    07/19/20 1900 07/21/20 0342 07/31/20 0000  WBC 11.5* 8.0 8.7  NEUTROABS 9.4*  --  5,672  HGB 12.5 11.2* 11.6*  HCT 39.3 34.7* 35*  MCV 94.9 97.2  --   PLT 176 153 281   Lab Results  Component Value Date   TSH 2.45 04/24/2020   No results found for: HGBA1C Lab Results  Component Value Date   CHOL 141 12/08/2019   HDL 49 12/08/2019   LDLCALC 78 12/08/2019   TRIG 59 12/08/2019    Significant Diagnostic Results in last 30 days:  No results found.  Assessment/Plan: Right inguinal hernia reported a bulge on top of R vaginal area, no pain or bruise noted. 02/21/20 HPOA declined surgical consultation.   L5 vertebral fracture ([place]) Hospitalized 07/19/20-07/24/20 for non displaced fx of L5 sustained from fall, takes Oxycodone 10mg  bid/prn q4h, prn Robaxin   Slow transit constipation Constipation, takes Senna, MiraLax   Vascular dementia ([place]) Dementia,  takes Memantine 10mg  bid     PAF (paroxysmal atrial fibrillation) (HCC) Afib, takes Eliquis 2.5mg  bid   Depression with anxiety Depression/anxiety, takes Sertraline 25mg  qd, Buspar   Rheumatoid arthritis (HCC) RA, takes Methotrexate 15mg  wkly   Osteoporosis OP, takes Prolia, Ca, Vit D    Family/ staff Communication: plan of care reviewed with the patient and charge nurse.   Labs/tests ordered:  None  Time spend 35 minutes.

## 2020-08-28 NOTE — Assessment & Plan Note (Signed)
Dementia, takes Memantine 10mg bid 

## 2020-09-01 ENCOUNTER — Other Ambulatory Visit: Payer: Self-pay | Admitting: *Deleted

## 2020-09-01 NOTE — Telephone Encounter (Signed)
[facility] Requested refill.

## 2020-09-02 MED ORDER — OXYCODONE HCL 10 MG PO TABS
10.0000 mg | ORAL_TABLET | Freq: Two times a day (BID) | ORAL | 0 refills | Status: DC
Start: 2020-09-02 — End: 2020-09-29

## 2020-09-09 ENCOUNTER — Non-Acute Institutional Stay (SKILLED_NURSING_FACILITY): Payer: Medicare Other | Admitting: Nurse Practitioner

## 2020-09-09 ENCOUNTER — Encounter: Payer: Self-pay | Admitting: Nurse Practitioner

## 2020-09-09 DIAGNOSIS — M069 Rheumatoid arthritis, unspecified: Secondary | ICD-10-CM

## 2020-09-09 DIAGNOSIS — I480 Paroxysmal atrial fibrillation: Secondary | ICD-10-CM | POA: Diagnosis not present

## 2020-09-09 DIAGNOSIS — F0150 Vascular dementia without behavioral disturbance: Secondary | ICD-10-CM | POA: Diagnosis not present

## 2020-09-09 DIAGNOSIS — S32051S Stable burst fracture of fifth lumbar vertebra, sequela: Secondary | ICD-10-CM | POA: Diagnosis not present

## 2020-09-09 DIAGNOSIS — K5901 Slow transit constipation: Secondary | ICD-10-CM | POA: Diagnosis not present

## 2020-09-09 DIAGNOSIS — F418 Other specified anxiety disorders: Secondary | ICD-10-CM

## 2020-09-09 DIAGNOSIS — M810 Age-related osteoporosis without current pathological fracture: Secondary | ICD-10-CM

## 2020-09-09 NOTE — Assessment & Plan Note (Signed)
RA, takes Methotrexate 15mg  wkly

## 2020-09-09 NOTE — Assessment & Plan Note (Signed)
OP, takes Prolia, Ca, Vit D

## 2020-09-09 NOTE — Progress Notes (Addendum)
Location:   [facility] Room Number: 50 Place of Service:  SNF (31) Provider:  Sander Remedios, Lennie Odor NP  Virgie Dad, MD  Patient Care Team: Virgie Dad, MD as PCP - General (Internal Medicine) Pichardo-Geisinger, Mila Palmer, MD as Referring Physician Nestor Ramp Effie Shy, MD as Referring Physician (Ophthalmology)  Extended Emergency Contact Information Primary Emergency Contact: Key, Tim Address: [address]          [place], [place] [address] [name] of [place] Phone: [phone] Relation: [name] Secondary Emergency Contact: Owens Shark Address: [address]          [place], [place] [address] [name] of [place] Phone: [phone] Mobile Phone: [phone] Relation: Other  Code Status:  Full Code Goals of care: Advanced Directive information Advanced Directives 07/22/2020  Does Patient Have a Medical Advance Directive? Yes  Type of Advance Directive Living will  Does patient want to make changes to medical advance directive? No - Patient declined  Copy of [place] in Chart? -  Would patient like information on creating a medical advance directive? -  Pre-existing out of facility DNR order (yellow form or pink MOST form) -     Chief Complaint  Patient presents with  . Medical Management of Chronic Issues    HPI:  Pt is a 86 y.o. female seen today for medical management of chronic diseases.      Hospitalized 07/19/20-07/24/20 for non displaced fx of L5 sustained from fall, takes Oxycodone 10mg  bid/prn q4h, prn Robaxin             Constipation, takes Senna, MiraLax             Dementia,  takes Memantine 10mg  bid Afib, takes Eliquis 2.5mg  bid Depression/anxiety, takes Sertraline 25mg  qd, Buspar RA, takes Methotrexate 15mg  wkly OP, takes Prolia, Ca, Vit D  Past Medical History:  Diagnosis Date  . Acid reflux disease   . Arthritis     rheumatoid  . Atrial fibrillation ([place])   . Back pain   . Bursitis of hip, right   . Cancer ([place])    skin  . Chest pain   . Confusion   . Constipation   . Fuchs' corneal dystrophy   . GERD (gastroesophageal reflux disease)   . Hard of hearing   . Knee pain   . Memory loss   . Paranoia ([place])   . Shoulder pain   . Venous insufficiency    Past Surgical History:  Procedure Laterality Date  . CORNEAL TRANSPLANT  2012   Dr Maudie Mercury   . RECONSTRUCTION OF EYELID     eyelid cancer 10 years ago Dr Victorino Dike   . RECONSTRUCTION OF NOSE     nose cancer/ Dr Nyoka Cowden 10 years ago     Allergies  Allergen Reactions  . Novocain [Procaine] Other (See Comments)    Unknown reaction  . Sulfamethoxazole-Trimethoprim Other (See Comments)    Unknown reaction  . Tape Other (See Comments)    Adhesive tape - unknown reaction  . Codeine Other (See Comments)    Unknown reaction  . Neosporin [Neomycin-Bacitracin Zn-Polymyx] Other (See Comments)    Unknown reaction    Allergies as of 09/09/2020      Reactions   Novocain [procaine] Other (See Comments)   Unknown reaction   Sulfamethoxazole-trimethoprim Other (See Comments)   Unknown reaction   Tape Other (See Comments)   Adhesive tape - unknown reaction   Codeine Other (See Comments)  Unknown reaction   Neosporin [neomycin-bacitracin Zn-polymyx] Other (See Comments)   Unknown reaction      Medication List       Accurate as of September 09, 2020 11:59 PM. If you have any questions, ask your nurse or doctor.        acetaminophen 325 MG tablet Commonly known as: TYLENOL Take 650 mg by mouth in the morning, at noon, in the evening, and at bedtime.   busPIRone 5 MG tablet Commonly known as: BUSPAR Take 5 mg by mouth daily.   denosumab 60 MG/ML Sosy injection Commonly known as: PROLIA Inject 60 mg into the skin every 6 (six) months. On the 22nd of reach 6th month   Eliquis 2.5 MG Tabs tablet Generic drug: apixaban Take 2.5 mg by mouth  2 (two) times daily.   folic acid 1 MG tablet Commonly known as: FOLVITE Take 1 mg by mouth daily.   Lidocaine Pain Relief 4 % Ptch Generic drug: Lidocaine Apply topically. Apply to lower back Q 24hrs Once A Day   memantine 10 MG tablet Commonly known as: NAMENDA Take 10 mg by mouth 2 (two) times daily.   methocarbamol 500 MG tablet Commonly known as: ROBAXIN Take 1 tablet (500 mg total) by mouth every 8 (eight) hours as needed for muscle spasms.   methotrexate 2.5 MG tablet Commonly known as: RHEUMATREX Take 15 mg by mouth every Friday. Caution:Chemotherapy. Protect from light.   mineral oil-hydrophilic petrolatum ointment Apply 1 application topically 2 (two) times daily as needed for dry skin. Apply to forehead, legs, and right mid back   Oxycodone HCl 10 MG Tabs Take 10 mg by mouth every 6 (six) hours as needed.   Oxycodone HCl 10 MG Tabs Take 1 tablet (10 mg total) by mouth 2 (two) times daily.   polyethylene glycol 17 g packet Commonly known as: MIRALAX / GLYCOLAX Take 17 g by mouth every other day.   pravastatin 20 MG tablet Commonly known as: PRAVACHOL Take 20 mg by mouth at bedtime.   prednisoLONE acetate 1 % ophthalmic suspension Commonly known as: PRED FORTE Place 1 drop into the right eye daily.   QUEtiapine 25 MG tablet Commonly known as: SEROQUEL Take 12.5 mg by mouth 2 (two) times daily.   Senexon-S 8.6-50 MG tablet Generic drug: senna-docusate Take 1 tablet by mouth daily as needed for mild constipation.   sertraline 50 MG tablet Commonly known as: ZOLOFT Take 50 mg by mouth daily.   sodium chloride 5 % ophthalmic solution Commonly known as: MURO 128 Place 1 drop into both eyes 4 (four) times daily.   Systane 0.4-0.3 % Soln Generic drug: Polyethyl Glycol-Propyl Glycol Place 1 drop into both eyes in the morning and at bedtime.       Review of Systems  Constitutional: Negative for fatigue, fever and unexpected weight change.  HENT:  Positive for hearing loss. Negative for congestion and voice change.   Eyes: Negative for visual disturbance.  Respiratory: Negative for cough and shortness of breath.   Cardiovascular: Positive for leg swelling. Negative for palpitations.  Gastrointestinal: Negative for abdominal pain and constipation.  Genitourinary: Negative for difficulty urinating, dysuria and urgency.  Musculoskeletal: Positive for arthralgias, back pain and gait problem.  Skin: Negative for color change.  Neurological: Negative for dizziness, speech difficulty and weakness.       Dementia  Psychiatric/Behavioral: Positive for confusion. Negative for behavioral problems and sleep disturbance. The patient is not nervous/anxious.     Immunization History  Administered Date(s) Administered  . Influenza, High Dose Seasonal PF 08/25/2019  . Influenza-Unspecified 10/01/2014, 09/03/2020  . Moderna SARS-COVID-2 Vaccination 11/24/2019, 12/22/2019  . Pneumococcal Conjugate-13 10/21/2014  . Pneumococcal Polysaccharide-23 01/23/2018  . Td 04/18/2013  . Zoster 09/04/2012   Pertinent  Health Maintenance Due  Topic Date Due  . DEXA SCAN  Never done  . INFLUENZA VACCINE  Completed  . PNA vac Low Risk Adult  Completed   No flowsheet data found. Functional Status Survey:    Vitals:   09/09/20 1415  BP: 124/68  Pulse: 71  Resp: 20  Temp: (!) 96.5 F (35.8 C)  SpO2: 92%  Weight: 152 lb 8 oz (69.2 kg)  Height: 5\' 6"  (1.676 m)   Body mass index is 24.61 kg/m. Physical Exam Vitals and nursing note reviewed.  Constitutional:      Appearance: Normal appearance.  HENT:     Head: Normocephalic and atraumatic.     Mouth/Throat:     Mouth: Mucous membranes are moist.  Eyes:     Conjunctiva/sclera: Conjunctivae normal.     Pupils: Pupils are equal, round, and reactive to light.  Cardiovascular:     Rate and Rhythm: Normal rate and regular rhythm.  Pulmonary:     Effort: Pulmonary effort is normal.     Breath  sounds: No rales.  Abdominal:     General: Bowel sounds are normal.     Palpations: Abdomen is soft.     Tenderness: There is no abdominal tenderness.     Hernia: A hernia is present.     Comments: Right inguinal hernia, reducible, a tennis ball sized   Musculoskeletal:        General: Tenderness present.     Cervical back: Normal range of motion and neck supple.     Right lower leg: Edema present.     Left lower leg: Edema present.     Comments: Lower back pain, bed rest most of time  Skin:    General: Skin is warm and dry.  Neurological:     General: No focal deficit present.     Mental Status: She is alert. Mental status is at baseline.     Gait: Gait abnormal.     Comments: Oriented to person, place.   Psychiatric:        Mood and Affect: Mood normal.        Behavior: Behavior normal.     Comments: Pleasantly confused.      Labs reviewed: Recent Labs    07/19/20 1900 07/20/20 0104 07/21/20 0342 07/31/20 0000  NA 139  --  140 139  K 3.9  --  4.1 4.2  CL 103  --  108 106  CO2 25  --  25 25*  GLUCOSE 127*  --  102*  --   BUN 17  --  23 19  CREATININE 0.99  --  1.07* 0.9  CALCIUM 10.6*  --  9.2 8.7  MG  --  2.2  --   --    Recent Labs    12/08/19 0000 04/24/20 0000 07/31/20 0000  AST 19 17 19   ALT 11 11 14   ALKPHOS 41 41 81  ALBUMIN  --   --  3.7   Recent Labs    07/19/20 1900 07/21/20 0342 07/31/20 0000  WBC 11.5* 8.0 8.7  NEUTROABS 9.4*  --  5,672  HGB 12.5 11.2* 11.6*  HCT 39.3 34.7* 35*  MCV 94.9 97.2  --   PLT  176 153 281   Lab Results  Component Value Date   TSH 2.45 04/24/2020   No results found for: HGBA1C Lab Results  Component Value Date   CHOL 141 12/08/2019   HDL 49 12/08/2019   LDLCALC 78 12/08/2019   TRIG 59 12/08/2019    Significant Diagnostic Results in last 30 days:  No results found.  Assessment/Plan L5 vertebral fracture ([place]) Hospitalized 07/19/20-07/24/20 for non displaced fx of L5 sustained from fall, pain is  better controlled, the patient is up in recliner,  takes Oxycodone 10mg  bid/prn q4h, prn Robaxin   Slow transit constipation Constipation, takes Senna, MiraLax  Vascular dementia ([place]) Dementia,  takes Memantine 10mg  bid  PAF (paroxysmal atrial fibrillation) (HCC) Afib, takes Eliquis 2.5mg  bid   Depression with anxiety Depression/anxiety, takes Sertraline 25mg  qd, Buspar   Rheumatoid arthritis ([place]) RA, takes Methotrexate 15mg  wkly   Osteoporosis OP, takes Prolia, Ca, Vit D       Family/ staff Communication: plan of care reviewed with the patient and charge nurse.   Labs/tests ordered:  none  Time spend 35 minutes.

## 2020-09-09 NOTE — Assessment & Plan Note (Signed)
Afib, takes Eliquis 2.5mg  bid

## 2020-09-09 NOTE — Assessment & Plan Note (Signed)
Constipation, takes Senna, MiraLax

## 2020-09-09 NOTE — Assessment & Plan Note (Addendum)
Hospitalized 07/19/20-07/24/20 for non displaced fx of L5 sustained from fall, pain is better controlled, the patient is up in recliner,  takes Oxycodone 10mg  bid/prn q4h, prn Robaxin

## 2020-09-09 NOTE — Assessment & Plan Note (Signed)
Depression/anxiety, takes Sertraline 25mg  qd, Buspar

## 2020-09-09 NOTE — Assessment & Plan Note (Signed)
Dementia,  takes Memantine 10mg  bid

## 2020-09-10 ENCOUNTER — Encounter: Payer: Self-pay | Admitting: Nurse Practitioner

## 2020-09-15 ENCOUNTER — Non-Acute Institutional Stay (SKILLED_NURSING_FACILITY): Payer: Medicare Other | Admitting: Nurse Practitioner

## 2020-09-15 ENCOUNTER — Encounter: Payer: Self-pay | Admitting: Nurse Practitioner

## 2020-09-15 DIAGNOSIS — F0150 Vascular dementia without behavioral disturbance: Secondary | ICD-10-CM

## 2020-09-15 DIAGNOSIS — F418 Other specified anxiety disorders: Secondary | ICD-10-CM | POA: Diagnosis not present

## 2020-09-15 DIAGNOSIS — I480 Paroxysmal atrial fibrillation: Secondary | ICD-10-CM

## 2020-09-15 DIAGNOSIS — M810 Age-related osteoporosis without current pathological fracture: Secondary | ICD-10-CM

## 2020-09-15 DIAGNOSIS — S32051S Stable burst fracture of fifth lumbar vertebra, sequela: Secondary | ICD-10-CM

## 2020-09-15 DIAGNOSIS — M069 Rheumatoid arthritis, unspecified: Secondary | ICD-10-CM | POA: Diagnosis not present

## 2020-09-15 DIAGNOSIS — K5901 Slow transit constipation: Secondary | ICD-10-CM

## 2020-09-15 NOTE — Assessment & Plan Note (Signed)
Afib, takes Eliquis 2.5mg  bid

## 2020-09-15 NOTE — Progress Notes (Signed)
Location:   [facility] Room Number: 50 Place of Service:  SNF (31) Provider: Lennie Odor Carlicia Leavens NP  Virgie Dad, MD  Patient Care Team: Virgie Dad, MD as PCP - General (Internal Medicine) Pichardo-Geisinger, Mila Palmer, MD as Referring Physician Nestor Ramp Effie Shy, MD as Referring Physician (Ophthalmology)  Extended Emergency Contact Information Primary Emergency Contact: Key, Tim Address: [address]          [place], [place] [address] [name] of [place] Phone: [phone] Relation: [name] Secondary Emergency Contact: Owens Shark Address: [address]          [place], [place] [address] [name] of [place] Phone: [phone] Mobile Phone: [phone] Relation: Other  Code Status:  DNR Goals of care: Advanced Directive information Advanced Directives 07/22/2020  Does Patient Have a Medical Advance Directive? Yes  Type of Advance Directive Living will  Does patient want to make changes to medical advance directive? No - Patient declined  Copy of [place] in Chart? -  Would patient like information on creating a medical advance directive? -  Pre-existing out of facility DNR order (yellow form or pink MOST form) -     Chief Complaint  Patient presents with  . Medical Management of Chronic Issues  . Health Maintenance    Dexa scan    HPI:  Pt is a 86 y.o. female seen today for medical management of chronic diseases.    Hospitalized 07/19/20-07/24/20 for non displaced fx of L5 sustained from fall, takes Oxycodone 10mg  bid/prn q4h, prn Robaxin Constipation, takes Senna, MiraLax Dementia, takes Memantine 10mg  bid Afib, takes Eliquis 2.5mg  bid Depression/anxiety, takes Sertraline 25mg  qd, Buspar RA, takes Methotrexate 15mg  wkly OP, takes Prolia, Ca, Vit D  Past Medical History:  Diagnosis Date  . Acid reflux disease     . Arthritis    rheumatoid  . Atrial fibrillation ([place])   . Back pain   . Bursitis of hip, right   . Cancer ([place])    skin  . Chest pain   . Confusion   . Constipation   . Fuchs' corneal dystrophy   . GERD (gastroesophageal reflux disease)   . Hard of hearing   . Knee pain   . Memory loss   . Paranoia ([place])   . Shoulder pain   . Venous insufficiency    Past Surgical History:  Procedure Laterality Date  . CORNEAL TRANSPLANT  2012   Dr Maudie Mercury   . RECONSTRUCTION OF EYELID     eyelid cancer 10 years ago Dr Victorino Dike   . RECONSTRUCTION OF NOSE     nose cancer/ Dr Nyoka Cowden 10 years ago     Allergies  Allergen Reactions  . Novocain [Procaine] Other (See Comments)    Unknown reaction  . Sulfamethoxazole-Trimethoprim Other (See Comments)    Unknown reaction  . Tape Other (See Comments)    Adhesive tape - unknown reaction  . Codeine Other (See Comments)    Unknown reaction  . Neosporin [Neomycin-Bacitracin Zn-Polymyx] Other (See Comments)    Unknown reaction    Allergies as of 09/15/2020      Reactions   Novocain [procaine] Other (See Comments)   Unknown reaction   Sulfamethoxazole-trimethoprim Other (See Comments)   Unknown reaction   Tape Other (See Comments)   Adhesive tape - unknown reaction   Codeine Other (See Comments)   Unknown reaction   Neosporin [neomycin-bacitracin Zn-polymyx] Other (See Comments)   Unknown reaction  Medication List       Accurate as of September 15, 2020 11:59 PM. If you have any questions, ask your nurse or doctor.        acetaminophen 325 MG tablet Commonly known as: TYLENOL Take 650 mg by mouth in the morning, at noon, in the evening, and at bedtime.   busPIRone 5 MG tablet Commonly known as: BUSPAR Take 5 mg by mouth daily.   denosumab 60 MG/ML Sosy injection Commonly known as: PROLIA Inject 60 mg into the skin every 6 (six) months. On the 22nd of reach 6th month   Eliquis 2.5 MG Tabs tablet Generic drug:  apixaban Take 2.5 mg by mouth 2 (two) times daily.   folic acid 1 MG tablet Commonly known as: FOLVITE Take 1 mg by mouth daily.   Lidocaine Pain Relief 4 % Ptch Generic drug: Lidocaine Apply topically. Apply to lower back Q 24hrs Once A Day   memantine 10 MG tablet Commonly known as: NAMENDA Take 10 mg by mouth 2 (two) times daily.   methocarbamol 500 MG tablet Commonly known as: ROBAXIN Take 1 tablet (500 mg total) by mouth every 8 (eight) hours as needed for muscle spasms.   methotrexate 2.5 MG tablet Commonly known as: RHEUMATREX Take 15 mg by mouth every Friday. Caution:Chemotherapy. Protect from light.   mineral oil-hydrophilic petrolatum ointment Apply 1 application topically 2 (two) times daily as needed for dry skin. Apply to forehead, legs, and right mid back   Oxycodone HCl 10 MG Tabs Take 10 mg by mouth every 6 (six) hours as needed.   Oxycodone HCl 10 MG Tabs Take 1 tablet (10 mg total) by mouth 2 (two) times daily.   polyethylene glycol 17 g packet Commonly known as: MIRALAX / GLYCOLAX Take 17 g by mouth every other day.   pravastatin 20 MG tablet Commonly known as: PRAVACHOL Take 20 mg by mouth at bedtime.   prednisoLONE acetate 1 % ophthalmic suspension Commonly known as: PRED FORTE Place 1 drop into the right eye daily.   QUEtiapine 25 MG tablet Commonly known as: SEROQUEL Take 12.5 mg by mouth 2 (two) times daily.   Senexon-S 8.6-50 MG tablet Generic drug: senna-docusate Take 2 tablets by mouth at bedtime.   sertraline 50 MG tablet Commonly known as: ZOLOFT Take 50 mg by mouth daily.   sodium chloride 5 % ophthalmic solution Commonly known as: MURO 128 Place 1 drop into both eyes 4 (four) times daily.   Systane 0.4-0.3 % Soln Generic drug: Polyethyl Glycol-Propyl Glycol Place 1 drop into both eyes in the morning and at bedtime.       Review of Systems  Constitutional: Negative for activity change, appetite change, fever and  unexpected weight change.  HENT: Positive for hearing loss. Negative for congestion and voice change.   Eyes: Negative for visual disturbance.  Respiratory: Negative for cough.   Cardiovascular: Positive for leg swelling. Negative for palpitations.  Gastrointestinal: Negative for abdominal pain and constipation.  Genitourinary: Negative for difficulty urinating, dysuria and urgency.  Musculoskeletal: Positive for arthralgias, back pain and gait problem.  Skin: Negative for color change.  Neurological: Negative for speech difficulty, weakness, light-headedness and headaches.       Dementia  Psychiatric/Behavioral: Positive for confusion. Negative for sleep disturbance. The patient is not nervous/anxious.     Immunization History  Administered Date(s) Administered  . Influenza, High Dose Seasonal PF 08/25/2019  . Influenza-Unspecified 10/01/2014, 09/03/2020  . Moderna SARS-COVID-2 Vaccination 11/24/2019, 12/22/2019  .  Pneumococcal Conjugate-13 10/21/2014  . Pneumococcal Polysaccharide-23 01/23/2018  . Td 04/18/2013  . Zoster 09/04/2012   Pertinent  Health Maintenance Due  Topic Date Due  . DEXA SCAN  Never done  . INFLUENZA VACCINE  Completed  . PNA vac Low Risk Adult  Completed   No flowsheet data found. Functional Status Survey:    Vitals:   09/15/20 1533  BP: 136/78  Pulse: 72  Resp: 20  Temp: 98.1 F (36.7 C)  SpO2: 91%  Weight: 152 lb 8 oz (69.2 kg)  Height: 5\' 6"  (1.676 m)   Body mass index is 24.61 kg/m. Physical Exam Vitals and nursing note reviewed.  Constitutional:      Appearance: Normal appearance.  HENT:     Head: Normocephalic and atraumatic.  Eyes:     Conjunctiva/sclera: Conjunctivae normal.     Pupils: Pupils are equal, round, and reactive to light.  Cardiovascular:     Rate and Rhythm: Normal rate and regular rhythm.  Pulmonary:     Effort: Pulmonary effort is normal.     Breath sounds: No rales.  Abdominal:     General: Bowel sounds are  normal.     Palpations: Abdomen is soft.     Tenderness: There is no abdominal tenderness.     Hernia: A hernia is present.     Comments: Right inguinal hernia, reducible, a tennis ball sized   Musculoskeletal:        General: Tenderness present.     Cervical back: Normal range of motion and neck supple.     Right lower leg: Edema present.     Left lower leg: Edema present.     Comments: Lower back pain, bed rest most of time  Skin:    General: Skin is warm and dry.  Neurological:     General: No focal deficit present.     Mental Status: She is alert. Mental status is at baseline.     Gait: Gait abnormal.     Comments: Oriented to person, place.   Psychiatric:        Mood and Affect: Mood normal.        Behavior: Behavior normal.     Comments: Pleasantly confused.      Labs reviewed: Recent Labs    07/19/20 1900 07/20/20 0104 07/21/20 0342 07/31/20 0000  NA 139  --  140 139  K 3.9  --  4.1 4.2  CL 103  --  108 106  CO2 25  --  25 25*  GLUCOSE 127*  --  102*  --   BUN 17  --  23 19  CREATININE 0.99  --  1.07* 0.9  CALCIUM 10.6*  --  9.2 8.7  MG  --  2.2  --   --    Recent Labs    12/08/19 0000 04/24/20 0000 07/31/20 0000  AST 19 17 19   ALT 11 11 14   ALKPHOS 41 41 81  ALBUMIN  --   --  3.7   Recent Labs    07/19/20 1900 07/21/20 0342 07/31/20 0000  WBC 11.5* 8.0 8.7  NEUTROABS 9.4*  --  5,672  HGB 12.5 11.2* 11.6*  HCT 39.3 34.7* 35*  MCV 94.9 97.2  --   PLT 176 153 281   Lab Results  Component Value Date   TSH 2.45 04/24/2020   No results found for: HGBA1C Lab Results  Component Value Date   CHOL 141 12/08/2019   HDL 49 12/08/2019  LDLCALC 78 12/08/2019   TRIG 59 12/08/2019    Significant Diagnostic Results in last 30 days:  No results found.  Assessment/Plan  Osteoporosis OP, takes Prolia, Ca, Vit D   Rheumatoid arthritis (HCC) RA, takes Methotrexate 15mg  wkly  Depression with anxiety Depression/anxiety, takes Sertraline 25mg   qd, Buspar   PAF (paroxysmal atrial fibrillation) (HCC) Afib, takes Eliquis 2.5mg  bid  Vascular dementia (HCC) Dementia, takes Memantine 10mg  bid   Slow transit constipation Constipation, takes Senna, MiraLax   L5 vertebral fracture ([place]) Hospitalized 07/19/20-07/24/20 for non displaced fx of L5 sustained from fall, takes Oxycodone 10mg  bid/prn q4h, prn Robaxin. Pain is better controlled.     Family/ staff Communication: plan of care reviewed with the patient and charge nurse.   Labs/tests ordered: none  Time spend 35 minutes.

## 2020-09-15 NOTE — Assessment & Plan Note (Signed)
Depression/anxiety, takes Sertraline 25mg  qd, Buspar

## 2020-09-15 NOTE — Assessment & Plan Note (Signed)
Dementia, takes Memantine 10mg  bid

## 2020-09-15 NOTE — Assessment & Plan Note (Signed)
RA, takes Methotrexate 15mg  wkly

## 2020-09-15 NOTE — Assessment & Plan Note (Signed)
Hospitalized 07/19/20-07/24/20 for non displaced fx of L5 sustained from fall, takes Oxycodone 10mg  bid/prn q4h, prn Robaxin. Pain is better controlled.

## 2020-09-15 NOTE — Assessment & Plan Note (Signed)
   Constipation, takes Senna, MiraLax

## 2020-09-15 NOTE — Assessment & Plan Note (Signed)
OP, takes Prolia, Ca, Vit D

## 2020-09-16 ENCOUNTER — Encounter: Payer: Self-pay | Admitting: Nurse Practitioner

## 2020-09-26 ENCOUNTER — Encounter: Payer: Self-pay | Admitting: Internal Medicine

## 2020-09-26 ENCOUNTER — Non-Acute Institutional Stay (SKILLED_NURSING_FACILITY): Payer: Medicare Other | Admitting: Internal Medicine

## 2020-09-26 DIAGNOSIS — F0150 Vascular dementia without behavioral disturbance: Secondary | ICD-10-CM | POA: Diagnosis not present

## 2020-09-26 DIAGNOSIS — I480 Paroxysmal atrial fibrillation: Secondary | ICD-10-CM | POA: Diagnosis not present

## 2020-09-26 DIAGNOSIS — R5383 Other fatigue: Secondary | ICD-10-CM | POA: Diagnosis not present

## 2020-09-26 DIAGNOSIS — M810 Age-related osteoporosis without current pathological fracture: Secondary | ICD-10-CM

## 2020-09-26 DIAGNOSIS — M069 Rheumatoid arthritis, unspecified: Secondary | ICD-10-CM

## 2020-09-26 DIAGNOSIS — F418 Other specified anxiety disorders: Secondary | ICD-10-CM

## 2020-09-26 DIAGNOSIS — S32051S Stable burst fracture of fifth lumbar vertebra, sequela: Secondary | ICD-10-CM

## 2020-09-26 NOTE — Progress Notes (Signed)
Location: [place] Room Number: 50 Place of Service:  SNF (31)  Provider:   Code Status: DNR Goals of Care:  Advanced Directives 07/22/2020  Does Patient Have a Medical Advance Directive? Yes  Type of Advance Directive Living will  Does patient want to make changes to medical advance directive? No - Patient declined  Copy of [place] in Chart? -  Would patient like information on creating a medical advance directive? -  Pre-existing out of facility DNR order (yellow form or pink MOST form) -     Chief Complaint  Patient presents with  . Acute Visit    HPI: Patient is a 86 y.o. female seen today for an acute visit for Lethargy noticed by Therapy in the morning  Patient has a history ofPAF on Eliquis, GERD, Rheumatoid arthritis, Hyperlipidemia, Osteoporosis and Dementiawith Anxietyand Behavior issues  Was admitted in the hospital from 8/28-9/2 for nondisplaced fracture of L5 with after sustaining a mechanical fall Initially when patient was admitted she was in a lot of pain and anxiety.  Was unable to do any therapy would scream as soon as the nurses would provide her care. She was started on around-the-clock oxycodone BuSpar and Seroquel and robaxin and lidocaine patches Since then patient has improved.  Has been walking with a walker with assist.  The screaming has stopped. But per therapy recently patient has been more sleepy in the morning.  And is unable to do much therapy with them in the morning Nurses wanted to know if we can reassess her medications . Patient unable to give me much history she was sitting in a wheelchair alert but very confused and was trying to talk to other patients she has not had any fever cough chest pain or any urinary symptoms   Past Medical History:  Diagnosis Date  . Acid reflux disease   . Arthritis    rheumatoid  . Atrial fibrillation ([place])   . Back pain   . Bursitis of hip, right   .  Cancer ([place])    skin  . Chest pain   . Confusion   . Constipation   . Fuchs' corneal dystrophy   . GERD (gastroesophageal reflux disease)   . Hard of hearing   . Knee pain   . Memory loss   . Paranoia ([place])   . Shoulder pain   . Venous insufficiency     Past Surgical History:  Procedure Laterality Date  . CORNEAL TRANSPLANT  2012   Dr Maudie Mercury   . RECONSTRUCTION OF EYELID     eyelid cancer 10 years ago Dr Victorino Dike   . RECONSTRUCTION OF NOSE     nose cancer/ Dr Nyoka Cowden 10 years ago     Allergies  Allergen Reactions  . Novocain [Procaine] Other (See Comments)    Unknown reaction  . Sulfamethoxazole-Trimethoprim Other (See Comments)    Unknown reaction  . Tape Other (See Comments)    Adhesive tape - unknown reaction  . Codeine Other (See Comments)    Unknown reaction  . Neosporin [Neomycin-Bacitracin Zn-Polymyx] Other (See Comments)    Unknown reaction    Outpatient Encounter Medications as of 09/26/2020  Medication Sig  . acetaminophen (TYLENOL) 325 MG tablet Take 650 mg by mouth in the morning, at noon, in the evening, and at bedtime.  Marland Kitchen apixaban (ELIQUIS) 2.5 MG TABS tablet Take 2.5 mg by mouth 2 (two) times daily.  . busPIRone (BUSPAR) 5 MG tablet Take 5  mg by mouth daily.   Marland Kitchen denosumab (PROLIA) 60 MG/ML SOSY injection Inject 60 mg into the skin every 6 (six) months. On the 22nd of reach 6th month  . folic acid (FOLVITE) 1 MG tablet Take 1 mg by mouth daily.  . Lidocaine (LIDOCAINE PAIN RELIEF) 4 % PTCH Apply topically. Apply to lower back Q 24hrs Once A Day  . memantine (NAMENDA) 10 MG tablet Take 10 mg by mouth 2 (two) times daily.   . methocarbamol (ROBAXIN) 500 MG tablet Take 1 tablet (500 mg total) by mouth every 8 (eight) hours as needed for muscle spasms.  . methotrexate (RHEUMATREX) 2.5 MG tablet Take 15 mg by mouth every Friday. Caution:Chemotherapy. Protect from light.  . mineral oil-hydrophilic petrolatum (AQUAPHOR) ointment Apply 1 application topically 2  (two) times daily as needed for dry skin. Apply to forehead, legs, and right mid back  . Oxycodone HCl 10 MG TABS Take 10 mg by mouth every 6 (six) hours as needed.  . Oxycodone HCl 10 MG TABS Take 1 tablet (10 mg total) by mouth 2 (two) times daily.  Vladimir Faster Glycol-Propyl Glycol (SYSTANE) 0.4-0.3 % SOLN Place 1 drop into both eyes in the morning and at bedtime.   . polyethylene glycol (MIRALAX / GLYCOLAX) 17 g packet Take 17 g by mouth every other day.  . pravastatin (PRAVACHOL) 20 MG tablet Take 20 mg by mouth at bedtime.   . prednisoLONE acetate (PRED FORTE) 1 % ophthalmic suspension Place 1 drop into the right eye daily.  . QUEtiapine (SEROQUEL) 25 MG tablet Take 12.5 mg by mouth 2 (two) times daily.  Marland Kitchen senna-docusate (SENEXON-S) 8.6-50 MG tablet Take 2 tablets by mouth at bedtime.   . sertraline (ZOLOFT) 50 MG tablet Take 50 mg by mouth daily.   . sodium chloride (MURO 128) 5 % ophthalmic solution Place 1 drop into both eyes 4 (four) times daily.   No facility-administered encounter medications on file as of 09/26/2020.    Review of Systems:  Review of Systems  Unable to perform ROS: Dementia    Health Maintenance  Topic Date Due  . DEXA SCAN  Never done  . TETANUS/TDAP  04/19/2023  . INFLUENZA VACCINE  Completed  . COVID-19 Vaccine  Completed  . PNA vac Low Risk Adult  Completed    Physical Exam: Vitals:   09/26/20 1603  BP: 100/62  Pulse: 80  Resp: 16  Temp: 97.8 F (36.6 C)  SpO2: 93%  Weight: 137 lb 8 oz (62.4 kg)  Height: 5\' 6"  (1.676 m)   Body mass index is 22.19 kg/m. Physical Exam Vitals reviewed.  Constitutional:      Appearance: Normal appearance.  HENT:     Head: Normocephalic.     Nose: Nose normal.     Mouth/Throat:     Mouth: Mucous membranes are moist.     Pharynx: Oropharynx is clear.  Eyes:     Pupils: Pupils are equal, round, and reactive to light.  Cardiovascular:     Rate and Rhythm: Normal rate and regular rhythm.     Pulses:  Normal pulses.     Heart sounds: Normal heart sounds.  Pulmonary:     Effort: Pulmonary effort is normal.     Breath sounds: Normal breath sounds.  Abdominal:     General: Abdomen is flat.     Palpations: Abdomen is soft.  Musculoskeletal:        General: Swelling present.     Cervical back: Neck  supple.  Skin:    General: Skin is warm.  Neurological:     General: No focal deficit present.     Mental Status: She is alert.     Comments: Very Confused. Not Oriented. Wheelchair depedent  Psychiatric:        Mood and Affect: Mood normal.     Labs reviewed: Basic Metabolic Panel: Recent Labs    04/24/20 0000 04/24/20 0000 07/19/20 1900 07/20/20 0104 07/21/20 0342 07/31/20 0000  NA 142  --  139  --  140 139  K 4.2   < > 3.9  --  4.1 4.2  CL 108   < > 103  --  108 106  CO2 29*   < > 25  --  25 25*  GLUCOSE  --   --  127*  --  102*  --   BUN 13  --  17  --  23 19  CREATININE 1.0  --  0.99  --  1.07* 0.9  CALCIUM 9.5   < > 10.6*  --  9.2 8.7  MG  --   --   --  2.2  --   --   TSH 2.45  --   --   --   --   --    < > = values in this interval not displayed.   Liver Function Tests: Recent Labs    12/08/19 0000 04/24/20 0000 07/31/20 0000  AST 19 17 19   ALT 11 11 14   ALKPHOS 41 41 81  ALBUMIN  --   --  3.7   No results for input(s): LIPASE, AMYLASE in the last 8760 hours. No results for input(s): AMMONIA in the last 8760 hours. CBC: Recent Labs    07/19/20 1900 07/21/20 0342 07/31/20 0000  WBC 11.5* 8.0 8.7  NEUTROABS 9.4*  --  5,672  HGB 12.5 11.2* 11.6*  HCT 39.3 34.7* 35*  MCV 94.9 97.2  --   PLT 176 153 281   Lipid Panel: Recent Labs    12/08/19 0000  CHOL 141  HDL 49  LDLCALC 78  TRIG 59   No results found for: HGBA1C  Procedures since last visit: No results found.  Assessment/Plan  Lethargy in the morning Discontinue Buspar Change Oxycodone to 5 mg BID Change Robaxin to 250 mg q 8 prn CBC,CMP   PAF (paroxysmal atrial fibrillation)  (HCC) On Eliquis  Rheumatoid arthritis, involving unspecified site, unspecified whether rheumatoid factor present ([place]) On Methotrexate  Vascular dementia without behavioral disturbance (HCC) Namenda and ,Aricept and Seroquel Age-related osteoporosis without current pathological fracture Continue on Prolia Depression with anxiety Continue on Zoloft Closed stable burst fracture of fifth lumbar vertebra, sequela Will control pain with lower doses of Oxycodone and Robaxin   Labs/tests ordered:  CBC,CMP

## 2020-09-28 LAB — BASIC METABOLIC PANEL
BUN: 14 (ref 4–21)
CO2: 23 — AB (ref 13–22)
Chloride: 104 (ref 99–108)
Creatinine: 0.7 (ref 0.5–1.1)
Glucose: 99
Potassium: 4.4 (ref 3.4–5.3)
Sodium: 140 (ref 137–147)

## 2020-09-28 LAB — COMPREHENSIVE METABOLIC PANEL
Albumin: 4 (ref 3.5–5.0)
Calcium: 10.2 (ref 8.7–10.7)
Globulin: 2.4

## 2020-09-28 LAB — HEPATIC FUNCTION PANEL
ALT: 10 (ref 7–35)
AST: 19 (ref 13–35)
Alkaline Phosphatase: 70 (ref 25–125)
Bilirubin, Total: 0.5

## 2020-09-28 LAB — CBC AND DIFFERENTIAL
HCT: 39 (ref 36–46)
Hemoglobin: 12.6 (ref 12.0–16.0)
Neutrophils Absolute: 5139
Platelets: 241 (ref 150–399)
WBC: 8.8

## 2020-09-28 LAB — CBC: RBC: 4.03 (ref 3.87–5.11)

## 2020-09-29 ENCOUNTER — Other Ambulatory Visit: Payer: Self-pay

## 2020-09-29 NOTE — Telephone Encounter (Signed)
Medication refill request received from [facility] for Oxycodone 5 mg tablet one tablet twice a day. Medication is fro Oxycodone 10 mg tablet, but patient takes 5 mg. Medication pended and sent to Dr. Lyndel Safe for approval.

## 2020-09-30 MED ORDER — OXYCODONE HCL 10 MG PO TABS
5.0000 mg | ORAL_TABLET | Freq: Two times a day (BID) | ORAL | 0 refills | Status: DC
Start: 2020-09-30 — End: 2020-11-26

## 2020-10-09 ENCOUNTER — Non-Acute Institutional Stay (SKILLED_NURSING_FACILITY): Payer: Medicare Other | Admitting: Nurse Practitioner

## 2020-10-09 ENCOUNTER — Encounter: Payer: Self-pay | Admitting: Nurse Practitioner

## 2020-10-09 DIAGNOSIS — F418 Other specified anxiety disorders: Secondary | ICD-10-CM | POA: Diagnosis not present

## 2020-10-09 DIAGNOSIS — M069 Rheumatoid arthritis, unspecified: Secondary | ICD-10-CM

## 2020-10-09 DIAGNOSIS — K5901 Slow transit constipation: Secondary | ICD-10-CM

## 2020-10-09 DIAGNOSIS — F0150 Vascular dementia without behavioral disturbance: Secondary | ICD-10-CM | POA: Diagnosis not present

## 2020-10-09 DIAGNOSIS — I480 Paroxysmal atrial fibrillation: Secondary | ICD-10-CM | POA: Diagnosis not present

## 2020-10-09 DIAGNOSIS — M810 Age-related osteoporosis without current pathological fracture: Secondary | ICD-10-CM

## 2020-10-09 DIAGNOSIS — S32051S Stable burst fracture of fifth lumbar vertebra, sequela: Secondary | ICD-10-CM

## 2020-10-09 NOTE — Assessment & Plan Note (Signed)
Depression/anxiety, takes Sertraline, Quetiapine,  off Buspar 09/26/20

## 2020-10-09 NOTE — Assessment & Plan Note (Signed)
Afib, heart rate is in control, takes Eliquis 2.5mg  bid

## 2020-10-09 NOTE — Progress Notes (Signed)
Location:    [facility] Room Number: 50 Place of Service:  SNF (31) Provider: Lennie Odor Ariba Lehnen NP  Virgie Dad, MD  Patient Care Team: Virgie Dad, MD as PCP - General (Internal Medicine) Pichardo-Geisinger, Mila Palmer, MD as Referring Physician Nestor Ramp Effie Shy, MD as Referring Physician (Ophthalmology)  Extended Emergency Contact Information Primary Emergency Contact: Key, Tim Address: [address]          [place], [place] [address] [name] of [place] Phone: [phone] Relation: [name] Secondary Emergency Contact: Owens Shark Address: [address]          [place], [place] [address] [name] of [place] Phone: [phone] Mobile Phone: [phone] Relation: Other  Code Status:  DNR Goals of care: Advanced Directive information Advanced Directives 07/22/2020  Does Patient Have a Medical Advance Directive? Yes  Type of Advance Directive Living will  Does patient want to make changes to medical advance directive? No - Patient declined  Copy of [place] in Chart? -  Would patient like information on creating a medical advance directive? -  Pre-existing out of facility DNR order (yellow form or pink MOST form) -     Chief Complaint  Patient presents with   Medical Management of Chronic Issues   Health Maintenance    Dexa scan    HPI:  Pt is a 86 y.o. female seen today for medical management of chronic diseases.   Hospitalized 07/19/20-07/24/20 for non displaced fx of L5 sustained from fall, takes Oxycodone 5mg  bid,  Robaxin 250mg  q8hr prn, Tylenol.  Constipation, takes Senna, MiraLax Dementia, takes Memantine 10mg  bid Afib, takes Eliquis 2.5mg  bid Depression/anxiety, takes Sertraline, Quetiapine,  off Buspar 09/26/20 RA, takes Methotrexate 15mg  wkly OP, takes Prolia, Ca, Vit D    Past Medical History:   Diagnosis Date   Acid reflux disease    Arthritis    rheumatoid   Atrial fibrillation (HCC)    Back pain    Bursitis of hip, right    Cancer (HCC)    skin   Chest pain    Confusion    Constipation    Fuchs' corneal dystrophy    GERD (gastroesophageal reflux disease)    Hard of hearing    Knee pain    Memory loss    Paranoia (HCC)    Shoulder pain    Venous insufficiency    Past Surgical History:  Procedure Laterality Date   CORNEAL TRANSPLANT  2012   Dr Maudie Mercury    RECONSTRUCTION OF EYELID     eyelid cancer 10 years ago Dr Victorino Dike    RECONSTRUCTION OF NOSE     nose cancer/ Dr Nyoka Cowden 10 years ago     Allergies  Allergen Reactions   Novocain [Procaine] Other (See Comments)    Unknown reaction   Sulfamethoxazole-Trimethoprim Other (See Comments)    Unknown reaction   Tape Other (See Comments)    Adhesive tape - unknown reaction   Codeine Other (See Comments)    Unknown reaction   Neosporin [Neomycin-Bacitracin Zn-Polymyx] Other (See Comments)    Unknown reaction    Allergies as of 10/09/2020      Reactions   Novocain [procaine] Other (See Comments)   Unknown reaction   Sulfamethoxazole-trimethoprim Other (See Comments)   Unknown reaction   Tape Other (See Comments)   Adhesive tape - unknown reaction   Codeine Other (See Comments)   Unknown reaction   Neosporin [neomycin-bacitracin Zn-polymyx] Other (See Comments)  Unknown reaction      Medication List       Accurate as of October 09, 2020 11:59 PM. If you have any questions, ask your nurse or doctor.        acetaminophen 325 MG tablet Commonly known as: TYLENOL Take 650 mg by mouth in the morning, at noon, in the evening, and at bedtime.   denosumab 60 MG/ML Sosy injection Commonly known as: PROLIA Inject 60 mg into the skin every 6 (six) months. On the 22nd of reach 6th month   Eliquis 2.5 MG Tabs tablet Generic drug: apixaban Take 2.5 mg by mouth 2 (two) times  daily.   fluorouracil 5 % cream Commonly known as: EFUDEX Apply topically 2 (two) times daily.   folic acid 1 MG tablet Commonly known as: FOLVITE Take 1 mg by mouth daily.   Lidocaine Pain Relief 4 % Ptch Generic drug: Lidocaine Apply topically. Apply to lower back Q 24hrs Once A Day   memantine 10 MG tablet Commonly known as: NAMENDA Take 10 mg by mouth 2 (two) times daily.   methocarbamol 500 MG tablet Commonly known as: ROBAXIN Take 250 mg by mouth every 8 (eight) hours as needed for muscle spasms. What changed: Another medication with the same name was removed. Continue taking this medication, and follow the directions you see here. Changed by: Peri Kreft X Cameron Schwinn, NP   methotrexate 2.5 MG tablet Commonly known as: RHEUMATREX Take 15 mg by mouth every Friday. Caution:Chemotherapy. Protect from light.   mineral oil-hydrophilic petrolatum ointment Apply 1 application topically 2 (two) times daily as needed for dry skin. Apply to forehead, legs, and right mid back   Oxycodone HCl 10 MG Tabs Take 5 mg by mouth every 6 (six) hours as needed.   Oxycodone HCl 10 MG Tabs Take 0.5 tablets (5 mg total) by mouth 2 (two) times daily.   polyethylene glycol 17 g packet Commonly known as: MIRALAX / GLYCOLAX Take 17 g by mouth every other day.   pravastatin 20 MG tablet Commonly known as: PRAVACHOL Take 20 mg by mouth at bedtime.   prednisoLONE acetate 1 % ophthalmic suspension Commonly known as: PRED FORTE Place 1 drop into the right eye daily.   QUEtiapine 25 MG tablet Commonly known as: SEROQUEL Take 12.5 mg by mouth 2 (two) times daily.   Senexon-S 8.6-50 MG tablet Generic drug: senna-docusate Take 2 tablets by mouth at bedtime.   sertraline 50 MG tablet Commonly known as: ZOLOFT Take 50 mg by mouth daily.   sodium chloride 5 % ophthalmic solution Commonly known as: MURO 128 Place 1 drop into both eyes 4 (four) times daily.   Systane 0.4-0.3 % Soln Generic drug:  Polyethyl Glycol-Propyl Glycol Place 1 drop into both eyes in the morning and at bedtime.       Review of Systems  Constitutional: Negative for fatigue, fever and unexpected weight change.       Same weight in the past 2 weeks.   HENT: Positive for hearing loss. Negative for congestion and voice change.   Eyes: Negative for visual disturbance.  Respiratory: Negative for cough.   Cardiovascular: Positive for leg swelling. Negative for palpitations.  Gastrointestinal: Negative for abdominal pain and constipation.  Genitourinary: Negative for difficulty urinating, dysuria and urgency.  Musculoskeletal: Positive for arthralgias, back pain and gait problem.  Skin: Negative for color change.  Neurological: Negative for dizziness, speech difficulty, weakness and headaches.       Dementia  Psychiatric/Behavioral: Positive for  confusion. Negative for sleep disturbance. The patient is not nervous/anxious.     Immunization History  Administered Date(s) Administered   Influenza, High Dose Seasonal PF 08/25/2019   Influenza-Unspecified 10/01/2014, 09/03/2020   Moderna SARS-COVID-2 Vaccination 11/24/2019, 12/22/2019   Pneumococcal Conjugate-13 10/21/2014   Pneumococcal Polysaccharide-23 01/23/2018   Td 04/18/2013   Zoster 09/04/2012   Pertinent  Health Maintenance Due  Topic Date Due   DEXA SCAN  Never done   INFLUENZA VACCINE  Completed   PNA vac Low Risk Adult  Completed   No flowsheet data found. Functional Status Survey:    Vitals:   10/09/20 1127  BP: 118/70  Pulse: 79  Resp: 16  Temp: 98 F (36.7 C)  SpO2: 97%  Weight: 137 lb 8 oz (62.4 kg)  Height: 5\' 6"  (1.676 m)   Body mass index is 22.19 kg/m. Physical Exam Vitals and nursing note reviewed.  Constitutional:      Appearance: Normal appearance.  HENT:     Head: Normocephalic and atraumatic.     Mouth/Throat:     Mouth: Mucous membranes are moist.  Eyes:     Conjunctiva/sclera: Conjunctivae normal.      Pupils: Pupils are equal, round, and reactive to light.  Cardiovascular:     Rate and Rhythm: Normal rate and regular rhythm.  Pulmonary:     Effort: Pulmonary effort is normal.     Breath sounds: No rales.  Abdominal:     General: Bowel sounds are normal.     Palpations: Abdomen is soft.     Tenderness: There is no abdominal tenderness.     Hernia: A hernia is present.     Comments: Right inguinal hernia, reducible, a tennis ball sized   Musculoskeletal:        General: Tenderness present.     Cervical back: Normal range of motion and neck supple.     Right lower leg: Edema present.     Left lower leg: Edema present.     Comments: Lower back pain, bed rest most of time  Skin:    General: Skin is warm and dry.  Neurological:     General: No focal deficit present.     Mental Status: She is alert. Mental status is at baseline.     Gait: Gait abnormal.     Comments: Oriented to person, place.   Psychiatric:        Mood and Affect: Mood normal.        Behavior: Behavior normal.     Comments: Pleasantly confused.      Labs reviewed: Recent Labs    07/19/20 1900 07/20/20 0104 07/21/20 0342 07/31/20 0000  NA 139  --  140 139  K 3.9  --  4.1 4.2  CL 103  --  108 106  CO2 25  --  25 25*  GLUCOSE 127*  --  102*  --   BUN 17  --  23 19  CREATININE 0.99  --  1.07* 0.9  CALCIUM 10.6*  --  9.2 8.7  MG  --  2.2  --   --    Recent Labs    12/08/19 0000 04/24/20 0000 07/31/20 0000  AST 19 17 19   ALT 11 11 14   ALKPHOS 41 41 81  ALBUMIN  --   --  3.7   Recent Labs    07/19/20 1900 07/21/20 0342 07/31/20 0000  WBC 11.5* 8.0 8.7  NEUTROABS 9.4*  --  5,672  HGB 12.5  11.2* 11.6*  HCT 39.3 34.7* 35*  MCV 94.9 97.2  --   PLT 176 153 281   Lab Results  Component Value Date   TSH 2.45 04/24/2020   No results found for: HGBA1C Lab Results  Component Value Date   CHOL 141 12/08/2019   HDL 49 12/08/2019   LDLCALC 78 12/08/2019   TRIG 59 12/08/2019     Significant Diagnostic Results in last 30 days:  No results found.  Assessment/Plan  PAF (paroxysmal atrial fibrillation) (HCC) Afib, heart rate is in control, takes Eliquis 2.5mg  bid  Slow transit constipation Constipation, takes Senna, MiraLax  Vascular dementia ([place]) Dementia, takes Memantine 10mg  bid  Depression with anxiety Depression/anxiety, takes Sertraline, Quetiapine,  off Buspar 09/26/20  Rheumatoid arthritis ([place]) RA, takes Methotrexate 15mg  wkly  Osteoporosis OP, takes Prolia, Ca, Vit D   L5 vertebral fracture ([place]) Hospitalized 07/19/20-07/24/20 for non displaced fx of L5 sustained from fall, takes Oxycodone 5mg  bid,  Robaxin 250mg  q8hr prn, Tylenol.     Family/ staff Communication: plan of care reviewed with the patient and charge nurse.   Labs/tests ordered:  none  Time spend 35 minutes.

## 2020-10-09 NOTE — Assessment & Plan Note (Signed)
Hospitalized 07/19/20-07/24/20 for non displaced fx of L5 sustained from fall, takes Oxycodone 5mg  bid,  Robaxin 250mg  q8hr prn, Tylenol.

## 2020-10-09 NOTE — Assessment & Plan Note (Signed)
Dementia, takes Memantine 10mg  bid

## 2020-10-09 NOTE — Assessment & Plan Note (Signed)
Constipation, takes Senna, MiraLax

## 2020-10-09 NOTE — Assessment & Plan Note (Signed)
OP, takes Prolia, Ca, Vit D

## 2020-10-09 NOTE — Assessment & Plan Note (Signed)
RA, takes Methotrexate 15mg  wkly

## 2020-10-10 ENCOUNTER — Encounter: Payer: Self-pay | Admitting: Nurse Practitioner

## 2020-10-27 ENCOUNTER — Encounter: Payer: Self-pay | Admitting: Internal Medicine

## 2020-10-27 ENCOUNTER — Non-Acute Institutional Stay (SKILLED_NURSING_FACILITY): Payer: Medicare Other | Admitting: Internal Medicine

## 2020-10-27 DIAGNOSIS — I480 Paroxysmal atrial fibrillation: Secondary | ICD-10-CM

## 2020-10-27 DIAGNOSIS — M069 Rheumatoid arthritis, unspecified: Secondary | ICD-10-CM

## 2020-10-27 DIAGNOSIS — F0150 Vascular dementia without behavioral disturbance: Secondary | ICD-10-CM

## 2020-10-27 DIAGNOSIS — F418 Other specified anxiety disorders: Secondary | ICD-10-CM | POA: Diagnosis not present

## 2020-10-27 DIAGNOSIS — E785 Hyperlipidemia, unspecified: Secondary | ICD-10-CM

## 2020-10-27 DIAGNOSIS — M810 Age-related osteoporosis without current pathological fracture: Secondary | ICD-10-CM

## 2020-10-27 DIAGNOSIS — S32051S Stable burst fracture of fifth lumbar vertebra, sequela: Secondary | ICD-10-CM

## 2020-10-27 NOTE — Progress Notes (Signed)
Location:   [facility] Room Number: 50 Place of Service:  SNF [phone]) Provider:  Veleta Miners MD  Virgie Dad, MD  Patient Care Team: Virgie Dad, MD as PCP - General (Internal Medicine) Pichardo-Geisinger, Mila Palmer, MD as Referring Physician Nestor Ramp Effie Shy, MD as Referring Physician (Ophthalmology)  Extended Emergency Contact Information Primary Emergency Contact: Key, Tim Address: [address]          [place], [place] [address] [name] of [place] Phone: [phone] Relation: [name] Secondary Emergency Contact: Owens Shark Address: [address]          [place], [place] [address] [name] of [place] Phone: [phone] Mobile Phone: [phone] Relation: Other  Code Status:  Full Code Goals of care: Advanced Directive information Advanced Directives 07/22/2020  Does Patient Have a Medical Advance Directive? Yes  Type of Advance Directive Living will  Does patient want to make changes to medical advance directive? No - Patient declined  Copy of [place] in Chart? -  Would patient like information on creating a medical advance directive? -  Pre-existing out of facility DNR order (yellow form or pink MOST form) -     Chief Complaint  Patient presents with  . Acute Visit    Eval for Oxycodone   . Medical Management of Chronic Issues    HPI:  Pt is a 86 y.o. female seen today for medical management of chronic diseases.    Patient has a history ofPAF on Eliquis, GERD, Rheumatoid arthritis, Hyperlipidemia, Osteoporosis and Dementiawith Anxietyand Behavior issues Was admitted in the hospital from 8/28-9/2 for nondisplaced fracture of L5 with after sustaining a mechanical fall  Patient is doing well. No Acute issues Weight stable now though lost around 20 lbs.since her injury  Nurses also think she does not need Oxycodone anymore She is mostly Wheelchair dependent for her  ADLS Does walk with assist and walker Has dementia Cannot give much history No Pain per patient   Past Medical History:  Diagnosis Date  . Acid reflux disease   . Arthritis    rheumatoid  . Atrial fibrillation ([place])   . Back pain   . Bursitis of hip, right   . Cancer ([place])    skin  . Chest pain   . Confusion   . Constipation   . Fuchs' corneal dystrophy   . GERD (gastroesophageal reflux disease)   . Hard of hearing   . Knee pain   . Memory loss   . Paranoia ([place])   . Shoulder pain   . Venous insufficiency    Past Surgical History:  Procedure Laterality Date  . CORNEAL TRANSPLANT  2012   Dr Maudie Mercury   . RECONSTRUCTION OF EYELID     eyelid cancer 10 years ago Dr Victorino Dike   . RECONSTRUCTION OF NOSE     nose cancer/ Dr Nyoka Cowden 10 years ago     Allergies  Allergen Reactions  . Novocain [Procaine] Other (See Comments)    Unknown reaction  . Sulfamethoxazole-Trimethoprim Other (See Comments)    Unknown reaction  . Tape Other (See Comments)    Adhesive tape - unknown reaction  . Codeine Other (See Comments)    Unknown reaction  . Neosporin [Neomycin-Bacitracin Zn-Polymyx] Other (See Comments)    Unknown reaction    Allergies as of 10/27/2020      Reactions   Novocain [procaine] Other (See Comments)   Unknown reaction   Sulfamethoxazole-trimethoprim Other (See Comments)  Unknown reaction   Tape Other (See Comments)   Adhesive tape - unknown reaction   Codeine Other (See Comments)   Unknown reaction   Neosporin [neomycin-bacitracin Zn-polymyx] Other (See Comments)   Unknown reaction      Medication List       Accurate as of October 27, 2020 12:36 PM. If you have any questions, ask your nurse or doctor.        STOP taking these medications   fluorouracil 5 % cream Commonly known as: EFUDEX Stopped by: Virgie Dad, MD     TAKE these medications   acetaminophen 325 MG tablet Commonly known as: TYLENOL Take 650 mg by mouth in the morning, at noon, in  the evening, and at bedtime.   denosumab 60 MG/ML Sosy injection Commonly known as: PROLIA Inject 60 mg into the skin every 6 (six) months. On the 22nd of reach 6th month   Eliquis 2.5 MG Tabs tablet Generic drug: apixaban Take 2.5 mg by mouth 2 (two) times daily.   folic acid 1 MG tablet Commonly known as: FOLVITE Take 1 mg by mouth daily.   lactose free nutrition Liqd Take 237 mLs by mouth daily.   Lidocaine Pain Relief 4 % Ptch Generic drug: Lidocaine Apply topically. Apply to lower back Q 24hrs Once A Day   memantine 10 MG tablet Commonly known as: NAMENDA Take 10 mg by mouth 2 (two) times daily.   methocarbamol 500 MG tablet Commonly known as: ROBAXIN Take 250 mg by mouth every 8 (eight) hours as needed for muscle spasms.   methotrexate 2.5 MG tablet Commonly known as: RHEUMATREX Take 15 mg by mouth every Friday. Caution:Chemotherapy. Protect from light.   mineral oil-hydrophilic petrolatum ointment Apply 1 application topically 2 (two) times daily as needed for dry skin. Apply to forehead, legs, and right mid back   Oxycodone HCl 10 MG Tabs Take 5 mg by mouth every 6 (six) hours as needed.   Oxycodone HCl 10 MG Tabs Take 0.5 tablets (5 mg total) by mouth 2 (two) times daily.   polyethylene glycol 17 g packet Commonly known as: MIRALAX / GLYCOLAX Take 17 g by mouth every other day.   pravastatin 20 MG tablet Commonly known as: PRAVACHOL Take 20 mg by mouth at bedtime.   prednisoLONE acetate 1 % ophthalmic suspension Commonly known as: PRED FORTE Place 1 drop into the right eye daily.   QUEtiapine 25 MG tablet Commonly known as: SEROQUEL Take 12.5 mg by mouth 2 (two) times daily.   Senexon-S 8.6-50 MG tablet Generic drug: senna-docusate Take 2 tablets by mouth at bedtime.   sertraline 50 MG tablet Commonly known as: ZOLOFT Take 50 mg by mouth daily.   sodium chloride 5 % ophthalmic solution Commonly known as: MURO 128 Place 1 drop into both  eyes 4 (four) times daily.   Systane 0.4-0.3 % Soln Generic drug: Polyethyl Glycol-Propyl Glycol Place 1 drop into both eyes in the morning and at bedtime.       Review of Systems  Unable to perform ROS: Dementia    Immunization History  Administered Date(s) Administered  . Influenza, High Dose Seasonal PF 08/25/2019  . Influenza-Unspecified 10/01/2014, 09/03/2020  . Moderna SARS-COVID-2 Vaccination 11/24/2019, 12/22/2019  . Pneumococcal Conjugate-13 10/21/2014  . Pneumococcal Polysaccharide-23 01/23/2018  . Td 04/18/2013  . Zoster 09/04/2012   Pertinent  Health Maintenance Due  Topic Date Due  . DEXA SCAN  Never done  . INFLUENZA VACCINE  Completed  .  PNA vac Low Risk Adult  Completed   No flowsheet data found. Functional Status Survey:    Vitals:   10/27/20 1220  BP: 118/64  Pulse: 74  Resp: 20  Temp: (!) 97.1 F (36.2 C)  SpO2: 95%  Weight: 137 lb 8 oz (62.4 kg)  Height: 5\' 6"  (1.676 m)   Body mass index is 22.19 kg/m. Physical Exam Vitals reviewed.  Constitutional:      Appearance: Normal appearance.  HENT:     Head: Normocephalic.     Nose: Nose normal.     Mouth/Throat:     Mouth: Mucous membranes are moist.     Pharynx: Oropharynx is clear.  Eyes:     Pupils: Pupils are equal, round, and reactive to light.  Cardiovascular:     Rate and Rhythm: Normal rate and regular rhythm.     Pulses: Normal pulses.     Heart sounds: Normal heart sounds.  Pulmonary:     Effort: Pulmonary effort is normal.     Breath sounds: Normal breath sounds.  Abdominal:     General: Abdomen is flat. Bowel sounds are normal.     Palpations: Abdomen is soft.  Musculoskeletal:        General: No swelling.     Cervical back: Neck supple.  Skin:    General: Skin is warm.  Neurological:     General: No focal deficit present.     Mental Status: She is alert.  Psychiatric:        Mood and Affect: Mood normal.        Thought Content: Thought content normal.      Labs reviewed: Recent Labs    07/19/20 1900 07/19/20 1900 07/20/20 0104 07/21/20 0342 07/31/20 0000 09/28/20 0000  NA 139   < >  --  140 139 140  K 3.9   < >  --  4.1 4.2 4.4  CL 103   < >  --  108 106 104  CO2 25   < >  --  25 25* 23*  GLUCOSE 127*  --   --  102*  --   --   BUN 17   < >  --  23 19 14   CREATININE 0.99   < >  --  1.07* 0.9 0.7  CALCIUM 10.6*   < >  --  9.2 8.7 10.2  MG  --   --  2.2  --   --   --    < > = values in this interval not displayed.   Recent Labs    04/24/20 0000 07/31/20 0000 09/28/20 0000  AST 17 19 19   ALT 11 14 10   ALKPHOS 41 81 70  ALBUMIN  --  3.7 4.0   Recent Labs    07/19/20 1900 07/19/20 1900 07/21/20 0342 07/31/20 0000 09/28/20 0000  WBC 11.5*   < > 8.0 8.7 8.8  NEUTROABS 9.4*  --   --  5,672 5,139.00  HGB 12.5   < > 11.2* 11.6* 12.6  HCT 39.3   < > 34.7* 35* 39  MCV 94.9  --  97.2  --   --   PLT 176   < > 153 281 241   < > = values in this interval not displayed.   Lab Results  Component Value Date   TSH 2.45 04/24/2020   No results found for: HGBA1C Lab Results  Component Value Date   CHOL 141 12/08/2019   HDL 49  12/08/2019   LDLCALC 78 12/08/2019   TRIG 59 12/08/2019    Significant Diagnostic Results in last 30 days:  No results found.  Assessment/Plan PAF (paroxysmal atrial fibrillation) (HCC) On Eliquis Depression with anxiety Continue on Zoloft and Seroquel Rheumatoid arthritis, involving unspecified site, unspecified whether rheumatoid factor present ([place]) On Methotrexate Age-related osteoporosis without current pathological fracture Continue Prolia Closed stable burst fracture of fifth lumbar vertebra, sequela Discontinue oxycodone Pain to be managed by tylenol and robaxin  Hyperlipidemia, unspecified hyperlipidemia type LDL good on statin Vascular dementia with behavioral disturbance (HCC) Supportive care Also on Namenda and Aricept    Family/ staff Communication:   Labs/tests  ordered:

## 2020-11-26 ENCOUNTER — Non-Acute Institutional Stay (SKILLED_NURSING_FACILITY): Payer: Medicare Other | Admitting: Nurse Practitioner

## 2020-11-26 ENCOUNTER — Encounter: Payer: Self-pay | Admitting: Nurse Practitioner

## 2020-11-26 DIAGNOSIS — I480 Paroxysmal atrial fibrillation: Secondary | ICD-10-CM

## 2020-11-26 DIAGNOSIS — F0150 Vascular dementia without behavioral disturbance: Secondary | ICD-10-CM | POA: Diagnosis not present

## 2020-11-26 DIAGNOSIS — S32051S Stable burst fracture of fifth lumbar vertebra, sequela: Secondary | ICD-10-CM | POA: Diagnosis not present

## 2020-11-26 DIAGNOSIS — K5901 Slow transit constipation: Secondary | ICD-10-CM | POA: Diagnosis not present

## 2020-11-26 DIAGNOSIS — M810 Age-related osteoporosis without current pathological fracture: Secondary | ICD-10-CM

## 2020-11-26 DIAGNOSIS — R634 Abnormal weight loss: Secondary | ICD-10-CM | POA: Insufficient documentation

## 2020-11-26 DIAGNOSIS — M069 Rheumatoid arthritis, unspecified: Secondary | ICD-10-CM

## 2020-11-26 DIAGNOSIS — R635 Abnormal weight gain: Secondary | ICD-10-CM | POA: Insufficient documentation

## 2020-11-26 NOTE — Assessment & Plan Note (Signed)
Constipation, takes Senna, MiraLax °

## 2020-11-26 NOTE — Assessment & Plan Note (Signed)
Dementia, takes Memantine 10mg bid 

## 2020-11-26 NOTE — Assessment & Plan Note (Signed)
RA, takes Methotrexate 15mg wkly 

## 2020-11-26 NOTE — Progress Notes (Unsigned)
Location:   [facility] Room Number: 50 Place of Service:  SNF (31) Provider:  Arilynn Blakeney, Arlean Hopping, NP  Mahlon Gammon, MD  Patient Care Team: Mahlon Gammon, MD as PCP - General (Internal Medicine) Pichardo-Geisinger, Robby Sermon, MD as Referring Physician Michel Santee, Lorenza Evangelist, MD as Referring Physician (Ophthalmology)  Extended Emergency Contact Information Primary Emergency Contact: Key, Tim Address: [address]          [place], [place] [address] Darden Amber of [place] Phone: [phone] Relation: Aurther Loft Secondary Emergency Contact: [place] Service Address: [address]          [place], [place] [address] Darden Amber of [place] Home Phone: [phone] Mobile Phone: [phone] Relation: Other  Code Status:  Full Code Goals of care: Advanced Directive information Advanced Directives 11/26/2020  Does Patient Have a Medical Advance Directive? Yes  Type of Advance Directive Living will  Does patient want to make changes to medical advance directive? No - Patient declined  Copy of Healthcare Power of Attorney in Chart? -  Would patient like information on creating a medical advance directive? -  Pre-existing out of facility DNR order (yellow form or pink MOST form) -     Chief Complaint  Patient presents with  . Acute Visit    Weight loss    HPI:  Pt is a 87 y.o. female seen today for an acute visit for    Past Medical History:  Diagnosis Date  . Acid reflux disease   . Arthritis    rheumatoid  . Atrial fibrillation (HCC)   . Back pain   . Bursitis of hip, right   . Cancer (HCC)    skin  . Chest pain   . Confusion   . Constipation   . Fuchs' corneal dystrophy   . GERD (gastroesophageal reflux disease)   . Hard of hearing   . Knee pain   . Memory loss   . Paranoia (HCC)   . Shoulder pain   . Venous insufficiency    Past Surgical History:  Procedure Laterality Date  . CORNEAL TRANSPLANT  2012   Dr Selena Batten   . RECONSTRUCTION  OF EYELID     eyelid cancer 10 years ago Dr Nehemiah Settle   . RECONSTRUCTION OF NOSE     nose cancer/ Dr Elonda Husky 10 years ago     Allergies  Allergen Reactions  . Novocain [Procaine] Other (See Comments)    Unknown reaction  . Sulfamethoxazole-Trimethoprim Other (See Comments)    Unknown reaction  . Tape Other (See Comments)    Adhesive tape - unknown reaction  . Codeine Other (See Comments)    Unknown reaction  . Neosporin [Neomycin-Bacitracin Zn-Polymyx] Other (See Comments)    Unknown reaction    Allergies as of 11/26/2020      Reactions   Novocain [procaine] Other (See Comments)   Unknown reaction   Sulfamethoxazole-trimethoprim Other (See Comments)   Unknown reaction   Tape Other (See Comments)   Adhesive tape - unknown reaction   Codeine Other (See Comments)   Unknown reaction   Neosporin [neomycin-bacitracin Zn-polymyx] Other (See Comments)   Unknown reaction      Medication List       Accurate as of November 26, 2020 11:59 PM. If you have any questions, ask your nurse or doctor.        STOP taking these medications   Oxycodone HCl 10 MG Tabs Stopped by: Gilberto Streck X Kaziah Krizek, NP     TAKE  these medications   acetaminophen 325 MG tablet Commonly known as: TYLENOL Take 650 mg by mouth in the morning, at noon, in the evening, and at bedtime.   apixaban 2.5 MG Tabs tablet Commonly known as: ELIQUIS Take 2.5 mg by mouth 2 (two) times daily.   denosumab 60 MG/ML Sosy injection Commonly known as: PROLIA Inject 60 mg into the skin every 6 (six) months. On the 22nd of reach 6th month   folic acid 1 MG tablet Commonly known as: FOLVITE Take 1 mg by mouth daily.   lactose free nutrition Liqd Take 237 mLs by mouth daily.   Lidocaine 4 % Ptch Apply topically. Apply to lower back Q 24hrs Once A Day   memantine 10 MG tablet Commonly known as: NAMENDA Take 10 mg by mouth 2 (two) times daily.   methocarbamol 500 MG tablet Commonly known as: ROBAXIN Take 250 mg by mouth  every 8 (eight) hours as needed for muscle spasms.   methotrexate 2.5 MG tablet Commonly known as: RHEUMATREX Take 15 mg by mouth every Friday. Caution:Chemotherapy. Protect from light.   mineral oil-hydrophilic petrolatum ointment Apply 1 application topically 2 (two) times daily as needed for dry skin. Apply to forehead, legs, and right mid back   polyethylene glycol 17 g packet Commonly known as: MIRALAX / GLYCOLAX Take 17 g by mouth every other day.   pravastatin 20 MG tablet Commonly known as: PRAVACHOL Take 20 mg by mouth at bedtime.   prednisoLONE acetate 1 % ophthalmic suspension Commonly known as: PRED FORTE Place 1 drop into the right eye daily.   QUEtiapine 25 MG tablet Commonly known as: SEROQUEL Take 12.5 mg by mouth 2 (two) times daily.   senna-docusate 8.6-50 MG tablet Commonly known as: Senokot-S Take 2 tablets by mouth at bedtime.   sertraline 50 MG tablet Commonly known as: ZOLOFT Take 50 mg by mouth daily.   sodium chloride 5 % ophthalmic solution Commonly known as: MURO 128 Place 1 drop into both eyes 4 (four) times daily.   Systane 0.4-0.3 % Soln Generic drug: Polyethyl Glycol-Propyl Glycol Place 1 drop into both eyes in the morning and at bedtime.       Review of Systems  Immunization History  Administered Date(s) Administered  . Influenza, High Dose Seasonal PF 08/25/2019  . Influenza-Unspecified 10/01/2014, 09/03/2020  . Moderna Sars-Covid-2 Vaccination 11/24/2019, 12/22/2019, 09/30/2020  . Pneumococcal Conjugate-13 10/21/2014  . Pneumococcal Polysaccharide-23 01/23/2018  . Td 04/18/2013  . Zoster 09/04/2012   Pertinent  Health Maintenance Due  Topic Date Due  . DEXA SCAN  Never done  . INFLUENZA VACCINE  Completed  . PNA vac Low Risk Adult  Completed   No flowsheet data found. Functional Status Survey:    Vitals:   11/26/20 1418  BP: 134/80  Pulse: 76  Resp: 18  Temp: (!) 97.1 F (36.2 C)  SpO2: 94%  Weight: 141 lb (64  kg)  Height: 5\' 6"  (1.676 m)   Body mass index is 22.76 kg/m. Physical Exam  Labs reviewed: Recent Labs    07/19/20 1900 07/20/20 0104 07/21/20 0342 07/31/20 0000 09/28/20 0000  NA 139  --  140 139 140  K 3.9  --  4.1 4.2 4.4  CL 103  --  108 106 104  CO2 25  --  25 25* 23*  GLUCOSE 127*  --  102*  --   --   BUN 17  --  23 19 14   CREATININE 0.99  --  1.07* 0.9 0.7  CALCIUM 10.6*  --  9.2 8.7 10.2  MG  --  2.2  --   --   --    Recent Labs    04/24/20 0000 07/31/20 0000 09/28/20 0000  AST 17 19 19   ALT 11 14 10   ALKPHOS 41 81 70  ALBUMIN  --  3.7 4.0   Recent Labs    07/19/20 1900 07/21/20 0342 07/31/20 0000 09/28/20 0000  WBC 11.5* 8.0 8.7 8.8  NEUTROABS 9.4*  --  5,672 5,139.00  HGB 12.5 11.2* 11.6* 12.6  HCT 39.3 34.7* 35* 39  MCV 94.9 97.2  --   --   PLT 176 153 281 241   Lab Results  Component Value Date   TSH 2.45 04/24/2020   No results found for: HGBA1C Lab Results  Component Value Date   CHOL 141 12/08/2019   HDL 49 12/08/2019   LDLCALC 78 12/08/2019   TRIG 59 12/08/2019    Significant Diagnostic Results in last 30 days:  No results found.  Assessment/Plan There are no diagnoses linked to this encounter.   Family/ staff Communication:   Labs/tests ordered:

## 2020-11-26 NOTE — Assessment & Plan Note (Addendum)
Afib, takes Eliquis 2.5mg bid  

## 2020-11-26 NOTE — Assessment & Plan Note (Signed)
dietary reported weight loss and recommended Mirtazapine. Weights: #152Ibs 08/06/20,  #137Ibs 09/24/20, #145bs 10/28/20, #141bs 11/12/20, it seems her weight fluctuating from # 137 to #145 in the past 2 months which is dropped about #10Ibs in the past 4 months since the L5 compression fx 06/2020, ? New baseline weight for her.  Update CBC/diff, CMP/eGFR, weekly weight, may consider Mirtazapine if weight loss continues.

## 2020-11-26 NOTE — Assessment & Plan Note (Signed)
L5 sustained from fall,07/19/20,takesRobaxin 250mg q8hr prn, Tylenol.  

## 2020-11-26 NOTE — Progress Notes (Signed)
Location:   SNF [place] Room Number: 85 Place of Service:  SNF (31) Provider: [facility] Ashely Goosby NP  Virgie Dad, MD  Patient Care Team: Virgie Dad, MD as PCP - General (Internal Medicine) Pichardo-Geisinger, Mila Palmer, MD as Referring Physician Nestor Ramp, Effie Shy, MD as Referring Physician (Ophthalmology)  Extended Emergency Contact Information Primary Emergency Contact: Key, Tim Address: [address]          [place], [place] [address] [name] of [place] Phone: [phone] Relation: Nephew Secondary Emergency Contact: Owens Shark Address: [address]          [place], [place] [address] [name] of [place] Phone: [phone] Mobile Phone: [phone] Relation: Other  Code Status: DNR Goals of care: Advanced Directive information Advanced Directives 11/26/2020  Does Patient Have a Medical Advance Directive? Yes  Type of Advance Directive Living will  Does patient want to make changes to medical advance directive? No - Patient declined  Copy of [place] in Chart? -  Would patient like information on creating a medical advance directive? -  Pre-existing out of facility DNR order (yellow form or pink MOST form) -     Chief Complaint  Patient presents with  . Acute Visit    Weight loss    HPI:  Pt is a 87 y.o. female seen today for an acute visit for dietary reported weight loss and recommended Mirtazapine. Weights: #152Ibs 08/06/20,  #137Ibs 09/24/20, #145bs 10/28/20, #141bs 11/12/20, it seems her weight fluctuating from # 137 to #145 in the past 2 months which is dropped about #10Ibs in the past 4 months since the L5 compression fx 06/2020, ? New baseline weight for her.   L5 sustained from fall, 07/19/20, takes Robaxin 236m q8hr prn, Tylenol.  Constipation, takes Senna, MiraLax Dementia, takes Memantine 143mbid Afib, takes Eliquis 2.48m60mbid Depression/anxiety, takes Sertraline, Quetiapine,  off Buspar 09/26/20 RA, takes Methotrexate 148m100mly OP, takes Prolia, Ca, Vit D          Past Medical History:  Diagnosis Date  . Acid reflux disease   . Arthritis    rheumatoid  . Atrial fibrillation (HCC)[place]. Back pain   . Bursitis of hip, right   . Cancer (HCC)[place] skin  . Chest pain   . Confusion   . Constipation   . Fuchs' corneal dystrophy   . GERD (gastroesophageal reflux disease)   . Hard of hearing   . Knee pain   . Memory loss   . Paranoia (HCC)[place]. Shoulder pain   . Venous insufficiency    Past Surgical History:  Procedure Laterality Date  . CORNEAL TRANSPLANT  2012   Dr Kim Maudie Mercury RECONSTRUCTION OF EYELID     eyelid cancer 10 years ago Dr WillVictorino Dike RECONSTRUCTION OF NOSE     nose cancer/ Dr GoldNyoka Cowdenyears ago     Allergies  Allergen Reactions  . Novocain [Procaine] Other (See Comments)    Unknown reaction  . Sulfamethoxazole-Trimethoprim Other (See Comments)    Unknown reaction  . Tape Other (See Comments)    Adhesive tape - unknown reaction  . Codeine Other (See Comments)    Unknown reaction  . Neosporin [Neomycin-Bacitracin Zn-Polymyx] Other (See Comments)    Unknown reaction    Allergies as of 11/26/2020      Reactions   Novocain [procaine] Other (See Comments)   Unknown reaction  Sulfamethoxazole-trimethoprim Other (See Comments)   Unknown reaction   Tape Other (See Comments)   Adhesive tape - unknown reaction   Codeine Other (See Comments)   Unknown reaction   Neosporin [neomycin-bacitracin Zn-polymyx] Other (See Comments)   Unknown reaction      Medication List       Accurate as of November 26, 2020  3:25 PM. If you have any questions, ask your nurse or doctor.        STOP taking these medications   Oxycodone HCl 10 MG Tabs Stopped by: Jeanetta Alonzo X Aisha Greenberger, NP     TAKE these medications   acetaminophen 325 MG tablet Commonly known as:  TYLENOL Take 650 mg by mouth in the morning, at noon, in the evening, and at bedtime.   apixaban 2.5 MG Tabs tablet Commonly known as: ELIQUIS Take 2.5 mg by mouth 2 (two) times daily.   denosumab 60 MG/ML Sosy injection Commonly known as: PROLIA Inject 60 mg into the skin every 6 (six) months. On the 22nd of reach 6th month   folic acid 1 MG tablet Commonly known as: FOLVITE Take 1 mg by mouth daily.   lactose free nutrition Liqd Take 237 mLs by mouth daily.   Lidocaine 4 % Ptch Apply topically. Apply to lower back Q 24hrs Once A Day   memantine 10 MG tablet Commonly known as: NAMENDA Take 10 mg by mouth 2 (two) times daily.   methocarbamol 500 MG tablet Commonly known as: ROBAXIN Take 250 mg by mouth every 8 (eight) hours as needed for muscle spasms.   methotrexate 2.5 MG tablet Commonly known as: RHEUMATREX Take 15 mg by mouth every Friday. Caution:Chemotherapy. Protect from light.   mineral oil-hydrophilic petrolatum ointment Apply 1 application topically 2 (two) times daily as needed for dry skin. Apply to forehead, legs, and right mid back   polyethylene glycol 17 g packet Commonly known as: MIRALAX / GLYCOLAX Take 17 g by mouth every other day.   pravastatin 20 MG tablet Commonly known as: PRAVACHOL Take 20 mg by mouth at bedtime.   prednisoLONE acetate 1 % ophthalmic suspension Commonly known as: PRED FORTE Place 1 drop into the right eye daily.   QUEtiapine 25 MG tablet Commonly known as: SEROQUEL Take 12.5 mg by mouth 2 (two) times daily.   senna-docusate 8.6-50 MG tablet Commonly known as: Senokot-S Take 2 tablets by mouth at bedtime.   sertraline 50 MG tablet Commonly known as: ZOLOFT Take 50 mg by mouth daily.   sodium chloride 5 % ophthalmic solution Commonly known as: MURO 128 Place 1 drop into both eyes 4 (four) times daily.   Systane 0.4-0.3 % Soln Generic drug: Polyethyl Glycol-Propyl Glycol Place 1 drop into both eyes in the  morning and at bedtime.       Review of Systems  Constitutional: Positive for unexpected weight change. Negative for fatigue and fever.  HENT: Positive for hearing loss. Negative for congestion and voice change.   Eyes: Negative for visual disturbance.  Respiratory: Negative for cough.   Cardiovascular: Positive for leg swelling. Negative for palpitations.  Gastrointestinal: Negative for abdominal pain and constipation.  Genitourinary: Negative for difficulty urinating, dysuria and urgency.  Musculoskeletal: Positive for arthralgias, back pain and gait problem.  Skin: Negative for color change.  Neurological: Negative for dizziness, speech difficulty, weakness and headaches.       Dementia  Psychiatric/Behavioral: Positive for confusion. Negative for sleep disturbance. The patient is not nervous/anxious.     Immunization  History  Administered Date(s) Administered  . Influenza, High Dose Seasonal PF 08/25/2019  . Influenza-Unspecified 10/01/2014, 09/03/2020  . Moderna Sars-Covid-2 Vaccination 11/24/2019, 12/22/2019, 09/30/2020  . Pneumococcal Conjugate-13 10/21/2014  . Pneumococcal Polysaccharide-23 01/23/2018  . Td 04/18/2013  . Zoster 09/04/2012   Pertinent  Health Maintenance Due  Topic Date Due  . DEXA SCAN  Never done  . INFLUENZA VACCINE  Completed  . PNA vac Low Risk Adult  Completed   No flowsheet data found. Functional Status Survey:    Vitals:   11/26/20 1418  BP: 134/80  Pulse: 76  Resp: 18  Temp: (!) 97.1 F (36.2 C)  SpO2: 94%  Weight: 141 lb (64 kg)  Height: 5' 6"  (1.676 m)   Body mass index is 22.76 kg/m. Physical Exam Vitals and nursing note reviewed.  Constitutional:      Appearance: Normal appearance.  HENT:     Head: Normocephalic and atraumatic.     Mouth/Throat:     Mouth: Mucous membranes are moist.  Eyes:     Conjunctiva/sclera: Conjunctivae normal.     Pupils: Pupils are equal, round, and reactive to light.  Cardiovascular:      Rate and Rhythm: Normal rate and regular rhythm.  Pulmonary:     Effort: Pulmonary effort is normal.     Breath sounds: No rales.  Abdominal:     General: Bowel sounds are normal.     Palpations: Abdomen is soft.     Tenderness: There is no abdominal tenderness.     Hernia: A hernia is present.     Comments: Right inguinal hernia, reducible, a tennis ball sized   Musculoskeletal:        General: Tenderness present.     Cervical back: Normal range of motion and neck supple.     Right lower leg: Edema present.     Left lower leg: Edema present.     Comments: Lower back pain is better. Trace edema BLE  Skin:    General: Skin is warm and dry.  Neurological:     General: No focal deficit present.     Mental Status: She is alert. Mental status is at baseline.     Gait: Gait abnormal.     Comments: Oriented to person, place.   Psychiatric:        Mood and Affect: Mood normal.        Behavior: Behavior normal.     Comments: Pleasantly confused.      Labs reviewed: Recent Labs    07/19/20 1900 07/20/20 0104 07/21/20 0342 07/31/20 0000 09/28/20 0000  NA 139  --  140 139 140  K 3.9  --  4.1 4.2 4.4  CL 103  --  108 106 104  CO2 25  --  25 25* 23*  GLUCOSE 127*  --  102*  --   --   BUN 17  --  23 19 14   CREATININE 0.99  --  1.07* 0.9 0.7  CALCIUM 10.6*  --  9.2 8.7 10.2  MG  --  2.2  --   --   --    Recent Labs    04/24/20 0000 07/31/20 0000 09/28/20 0000  AST 17 19 19   ALT 11 14 10   ALKPHOS 41 81 70  ALBUMIN  --  3.7 4.0   Recent Labs    07/19/20 1900 07/21/20 0342 07/31/20 0000 09/28/20 0000  WBC 11.5* 8.0 8.7 8.8  NEUTROABS 9.4*  --  5,672 5,139.00  HGB 12.5 11.2* 11.6* 12.6  HCT 39.3 34.7* 35* 39  MCV 94.9 97.2  --   --   PLT 176 153 281 241   Lab Results  Component Value Date   TSH 2.45 04/24/2020   No results found for: HGBA1C Lab Results  Component Value Date   CHOL 141 12/08/2019   HDL 49 12/08/2019   LDLCALC 78 12/08/2019   TRIG 59  12/08/2019    Significant Diagnostic Results in last 30 days:  No results found.  Assessment/Plan: Weight loss dietary reported weight loss and recommended Mirtazapine. Weights: #152Ibs 08/06/20,  #137Ibs 09/24/20, #145bs 10/28/20, #141bs 11/12/20, it seems her weight fluctuating from # 137 to #145 in the past 2 months which is dropped about #10Ibs in the past 4 months since the L5 compression fx 06/2020, ? New baseline weight for her.  Update CBC/diff, CMP/eGFR, weekly weight, may consider Mirtazapine if weight loss continues.    L5 vertebral fracture ([place]) L5 sustained from fall, 07/19/20, takes Robaxin 252m q8hr prn, Tylenol.   Slow transit constipation Constipation, takes Senna, MiraLax   Vascular dementia (HCC) Dementia, takes Memantine 116mbid   PAF (paroxysmal atrial fibrillation) (HCC) Afib, takes Eliquis 2.33m11mid   Rheumatoid arthritis (HCC) RA, takes Methotrexate 133m81mly  Osteoporosis OP, takes Prolia, Ca, Vit D            Family/ staff Communication: plan of care reviewed with the patient and charge nurse.   Labs/tests ordered:  CBC/diff, CMP/eGFR  Time spend 35 minutes.

## 2020-11-26 NOTE — Assessment & Plan Note (Signed)
OP, takes Prolia, Ca, Vit D   

## 2020-11-27 ENCOUNTER — Encounter: Payer: Self-pay | Admitting: Nurse Practitioner

## 2020-11-27 LAB — HEPATIC FUNCTION PANEL
ALT: 6 — AB (ref 7–35)
AST: 14 (ref 13–35)
Alkaline Phosphatase: 47 (ref 25–125)
Bilirubin, Total: 0.4

## 2020-11-27 LAB — COMPREHENSIVE METABOLIC PANEL
Albumin: 3.4 — AB (ref 3.5–5.0)
Calcium: 8.9 (ref 8.7–10.7)
Globulin: 2.2

## 2020-11-27 LAB — CBC AND DIFFERENTIAL
HCT: 33 — AB (ref 36–46)
Hemoglobin: 10.8 — AB (ref 12.0–16.0)
Neutrophils Absolute: 3032
Platelets: 204 (ref 150–399)
WBC: 6.1

## 2020-11-27 LAB — BASIC METABOLIC PANEL
BUN: 16 (ref 4–21)
CO2: 28 — AB (ref 13–22)
Chloride: 108 (ref 99–108)
Creatinine: 0.8 (ref 0.5–1.1)
Glucose: 74
Potassium: 5 (ref 3.4–5.3)
Sodium: 140 (ref 137–147)

## 2020-11-27 LAB — CBC: RBC: 3.38 — AB (ref 3.87–5.11)

## 2020-12-09 ENCOUNTER — Non-Acute Institutional Stay (SKILLED_NURSING_FACILITY): Payer: Medicare Other | Admitting: Internal Medicine

## 2020-12-09 ENCOUNTER — Encounter: Payer: Self-pay | Admitting: Internal Medicine

## 2020-12-09 DIAGNOSIS — I480 Paroxysmal atrial fibrillation: Secondary | ICD-10-CM

## 2020-12-09 DIAGNOSIS — M069 Rheumatoid arthritis, unspecified: Secondary | ICD-10-CM

## 2020-12-09 DIAGNOSIS — F0150 Vascular dementia without behavioral disturbance: Secondary | ICD-10-CM

## 2020-12-09 DIAGNOSIS — L89892 Pressure ulcer of other site, stage 2: Secondary | ICD-10-CM

## 2020-12-09 DIAGNOSIS — S32051S Stable burst fracture of fifth lumbar vertebra, sequela: Secondary | ICD-10-CM | POA: Diagnosis not present

## 2020-12-09 NOTE — Progress Notes (Signed)
Location:   [facility] Room Number: 50 Place of Service:  SNF [phone]) Provider:  Veleta Miners MD  Virgie Dad, MD  Patient Care Team: Virgie Dad, MD as PCP - General (Internal Medicine) Pichardo-Geisinger, Mila Palmer, MD as Referring Physician Nestor Ramp Effie Shy, MD as Referring Physician (Ophthalmology)  Extended Emergency Contact Information Primary Emergency Contact: Key, Tim Address: [address]          [place], [place] [address] [name] of [place] Phone: [phone] Relation: [name] Secondary Emergency Contact: Owens Shark Address: [address]          [place], [place] [address] [name] of [place] Phone: [phone] Mobile Phone: [phone] Relation: Other  Code Status:  Full Code  Goals of care: Advanced Directive information Advanced Directives 11/26/2020  Does Patient Have a Medical Advance Directive? Yes  Type of Advance Directive Living will  Does patient want to make changes to medical advance directive? No - Patient declined  Copy of [place] in Chart? -  Would patient like information on creating a medical advance directive? -  Pre-existing out of facility DNR order (yellow form or pink MOST form) -     Chief Complaint  Patient presents with  . Acute Visit    New discolored area    HPI:  Pt is a 87 y.o. female seen today for an acute visit for New Area of Discoloration on her Left Great Toe   Patient has a history ofPAF on Eliquis,  , Rheumatoid arthritis, Hyperlipidemia, Osteoporosis and  Dementiawith Anxietyand Behavior issues nondisplaced fracture of L5 with after sustaining a mechanical fall  Noticed Pain in Left Great Toe with Discoloration No Redness or Discharge  Past Medical History:  Diagnosis Date  . Acid reflux disease   . Arthritis    rheumatoid  . Atrial fibrillation ([place])   . Back pain   . Bursitis of hip, right   . Cancer ([place])    skin   . Chest pain   . Confusion   . Constipation   . Fuchs' corneal dystrophy   . GERD (gastroesophageal reflux disease)   . Hard of hearing   . Knee pain   . Memory loss   . Paranoia ([place])   . Shoulder pain   . Venous insufficiency    Past Surgical History:  Procedure Laterality Date  . CORNEAL TRANSPLANT  2012   Dr Maudie Mercury   . RECONSTRUCTION OF EYELID     eyelid cancer 10 years ago Dr Victorino Dike   . RECONSTRUCTION OF NOSE     nose cancer/ Dr Nyoka Cowden 10 years ago     Allergies  Allergen Reactions  . Novocain [Procaine] Other (See Comments)    Unknown reaction  . Sulfamethoxazole-Trimethoprim Other (See Comments)    Unknown reaction  . Tape Other (See Comments)    Adhesive tape - unknown reaction  . Codeine Other (See Comments)    Unknown reaction  . Neosporin [Neomycin-Bacitracin Zn-Polymyx] Other (See Comments)    Unknown reaction    Allergies as of 12/09/2020      Reactions   Novocain [procaine] Other (See Comments)   Unknown reaction   Sulfamethoxazole-trimethoprim Other (See Comments)   Unknown reaction   Tape Other (See Comments)   Adhesive tape - unknown reaction   Codeine Other (See Comments)   Unknown reaction   Neosporin [neomycin-bacitracin Zn-polymyx] Other (See Comments)   Unknown reaction      Medication List  Accurate as of December 09, 2020 11:59 PM. If you have any questions, ask your nurse or doctor.        acetaminophen 325 MG tablet Commonly known as: TYLENOL Take 650 mg by mouth in the morning, at noon, in the evening, and at bedtime.   apixaban 2.5 MG Tabs tablet Commonly known as: ELIQUIS Take 2.5 mg by mouth 2 (two) times daily.   denosumab 60 MG/ML Sosy injection Commonly known as: PROLIA Inject 60 mg into the skin every 6 (six) months. On the 22nd of reach 6th month   folic acid 1 MG tablet Commonly known as: FOLVITE Take 1 mg by mouth daily.   lactose free nutrition Liqd Take 237 mLs by mouth daily.   Lidocaine 4 %  Ptch Apply topically. Apply to lower back Q 24hrs Once A Day   memantine 10 MG tablet Commonly known as: NAMENDA Take 10 mg by mouth 2 (two) times daily.   methocarbamol 500 MG tablet Commonly known as: ROBAXIN Take 250 mg by mouth every 8 (eight) hours as needed for muscle spasms.   methotrexate 2.5 MG tablet Commonly known as: RHEUMATREX Take 15 mg by mouth every Friday. Caution:Chemotherapy. Protect from light.   mineral oil-hydrophilic petrolatum ointment Apply 1 application topically 2 (two) times daily as needed for dry skin. Apply to forehead, legs, and right mid back   polyethylene glycol 17 g packet Commonly known as: MIRALAX / GLYCOLAX Take 17 g by mouth every other day.   pravastatin 20 MG tablet Commonly known as: PRAVACHOL Take 20 mg by mouth at bedtime.   prednisoLONE acetate 1 % ophthalmic suspension Commonly known as: PRED FORTE Place 1 drop into the right eye daily.   QUEtiapine 25 MG tablet Commonly known as: SEROQUEL Take 12.5 mg by mouth 2 (two) times daily.   senna-docusate 8.6-50 MG tablet Commonly known as: Senokot-S Take 2 tablets by mouth at bedtime.   sertraline 50 MG tablet Commonly known as: ZOLOFT Take 50 mg by mouth daily.   sodium chloride 5 % ophthalmic solution Commonly known as: MURO 128 Place 1 drop into both eyes 4 (four) times daily.   Systane 0.4-0.3 % Soln Generic drug: Polyethyl Glycol-Propyl Glycol Place 1 drop into both eyes in the morning and at bedtime.       Review of Systems  Unable to perform ROS: Dementia    Immunization History  Administered Date(s) Administered  . Influenza, High Dose Seasonal PF 08/25/2019  . Influenza-Unspecified 10/01/2014, 09/03/2020  . Moderna Sars-Covid-2 Vaccination 11/24/2019, 12/22/2019, 09/30/2020  . Pneumococcal Conjugate-13 10/21/2014  . Pneumococcal Polysaccharide-23 01/23/2018  . Td 04/18/2013  . Zoster 09/04/2012   Pertinent  Health Maintenance Due  Topic Date Due  .  DEXA SCAN  Never done  . INFLUENZA VACCINE  Completed  . PNA vac Low Risk Adult  Completed   No flowsheet data found. Functional Status Survey:    Vitals:   12/09/20 1146  BP: 138/78  Pulse: 81  Resp: 18  Temp: 98.6 F (37 C)  SpO2: 94%  Weight: 141 lb (64 kg)  Height: 5\' 6"  (1.676 m)   Body mass index is 22.76 kg/m. Physical Exam  Constitutional: Well-developed and well-nourished.  HENT:  Head: Normocephalic.  Mouth/Throat: Oropharynx is clear and moist.  Eyes: Pupils are equal, round, and reactive to light.  Neck: Neck supple.  Cardiovascular: Normal rate and normal heart sounds.  No murmur heard. Pulmonary/Chest: Effort normal and breath sounds normal. No respiratory distress. No  wheezes. She has no rales.  Abdominal: Soft. Bowel sounds are normal. No distension. There is no tenderness. There is no rebound.  Musculoskeletal: A small Pressure sore on Left Great Toe No Signs of infection Lymphadenopathy: none Neurological: Alert and oriented to person, place, and time.  Skin: Skin is warm and dry.  Psychiatric: Normal mood and affect. Behavior is normal. Thought content normal.   Labs reviewed: Recent Labs    07/19/20 1900 07/20/20 0104 07/21/20 0342 07/31/20 0000 09/28/20 0000 11/27/20 0000  NA 139  --  140 139 140 140  K 3.9  --  4.1 4.2 4.4 5.0  CL 103  --  108 106 104 108  CO2 25  --  25 25* 23* 28*  GLUCOSE 127*  --  102*  --   --   --   BUN 17  --  23 19 14 16   CREATININE 0.99  --  1.07* 0.9 0.7 0.8  CALCIUM 10.6*  --  9.2 8.7 10.2 8.9  MG  --  2.2  --   --   --   --    Recent Labs    07/31/20 0000 09/28/20 0000 11/27/20 0000  AST 19 19 14   ALT 14 10 6*  ALKPHOS 81 70 47  ALBUMIN 3.7 4.0 3.4*   Recent Labs    07/19/20 1900 07/21/20 0342 07/31/20 0000 09/28/20 0000 11/27/20 0000  WBC 11.5* 8.0 8.7 8.8 6.1  NEUTROABS 9.4*  --  5,672 5,139.00 3,032.00  HGB 12.5 11.2* 11.6* 12.6 10.8*  HCT 39.3 34.7* 35* 39 33*  MCV 94.9 97.2  --   --    --   PLT 176 153 281 241 204   Lab Results  Component Value Date   TSH 2.45 04/24/2020   No results found for: HGBA1C Lab Results  Component Value Date   CHOL 141 12/08/2019   HDL 49 12/08/2019   LDLCALC 78 12/08/2019   TRIG 59 12/08/2019    Significant Diagnostic Results in last 30 days:  No results found.  Assessment/Plan Pressure injury of toe of left foot, stage 2 (HCC) Skin Prep and Foam dressing to Alleviate pressure  Other issues  Closed stable burst fracture of fifth lumbar vertebra, sequela Pain Managed with Tylenol and Robaxin Vascular dementia without behavioral disturbance (HCC) Supportive care On Namenda and Seroquel PAF (paroxysmal atrial fibrillation) (HCC) On Eliquis Rheumatoid arthritis, involving unspecified site, unspecified whether rheumatoid factor present ([place]) Methotrexate  HLD On Statin Osteoporosis Prolia Depression Continue Zoloft     Family/ staff Communication:   Labs/tests ordered:

## 2020-12-11 LAB — IRON,TIBC AND FERRITIN PANEL
%SAT: 34
Ferritin: 165
Iron: 66
TIBC: 196

## 2020-12-11 LAB — CBC AND DIFFERENTIAL
HCT: 33 — AB (ref 36–46)
Hemoglobin: 10.6 — AB (ref 12.0–16.0)
Neutrophils Absolute: 2378
Platelets: 205 (ref 150–399)
WBC: 5.8

## 2020-12-11 LAB — CBC: RBC: 3.33 — AB (ref 3.87–5.11)

## 2020-12-15 ENCOUNTER — Encounter: Payer: Self-pay | Admitting: Internal Medicine

## 2020-12-15 ENCOUNTER — Non-Acute Institutional Stay (SKILLED_NURSING_FACILITY): Payer: Medicare Other | Admitting: Internal Medicine

## 2020-12-15 DIAGNOSIS — I480 Paroxysmal atrial fibrillation: Secondary | ICD-10-CM | POA: Diagnosis not present

## 2020-12-15 DIAGNOSIS — M069 Rheumatoid arthritis, unspecified: Secondary | ICD-10-CM

## 2020-12-15 DIAGNOSIS — F418 Other specified anxiety disorders: Secondary | ICD-10-CM

## 2020-12-15 DIAGNOSIS — U071 COVID-19: Secondary | ICD-10-CM | POA: Diagnosis not present

## 2020-12-15 DIAGNOSIS — K4090 Unilateral inguinal hernia, without obstruction or gangrene, not specified as recurrent: Secondary | ICD-10-CM

## 2020-12-15 DIAGNOSIS — M810 Age-related osteoporosis without current pathological fracture: Secondary | ICD-10-CM

## 2020-12-15 DIAGNOSIS — F0150 Vascular dementia without behavioral disturbance: Secondary | ICD-10-CM | POA: Diagnosis not present

## 2020-12-15 DIAGNOSIS — R634 Abnormal weight loss: Secondary | ICD-10-CM

## 2020-12-15 NOTE — Progress Notes (Signed)
Location: [facility] Room Number: 50 Place of Service:  SNF (31)  Provider:   Code Status:  Goals of Care:  Advanced Directives 11/26/2020  Does Patient Have a Medical Advance Directive? Yes  Type of Advance Directive Living will  Does patient want to make changes to medical advance directive? No - Patient declined  Copy of [place] in Chart? -  Would patient like information on creating a medical advance directive? -  Pre-existing out of facility DNR order (yellow form or pink MOST form) -     Chief Complaint  Patient presents with  . Acute Visit    Right Groin Hernia      HPI: Patient is a 87 y.o. female seen today for an acute visit for Right Groin Swelling and Patient is Covid Positive  Patient has a history ofPAF on Eliquis,  ,Rheumatoid arthritis, Hyperlipidemia, Osteoporosis and  Dementiawith Anxietyand Behavior issues nondisplaced fracture of L5 with after sustaining a mechanical fall  Noticed painless swelling in her Right Groin area. No Nausea or vomiting Also recent diagnosis of Covid Asymptomatic No Cough or SOB. Was screened due to positive Exposure    Past Medical History:  Diagnosis Date  . Acid reflux disease   . Arthritis    rheumatoid  . Atrial fibrillation ([place])   . Back pain   . Bursitis of hip, right   . Cancer ([place])    skin  . Chest pain   . Confusion   . Constipation   . Fuchs' corneal dystrophy   . GERD (gastroesophageal reflux disease)   . Hard of hearing   . Knee pain   . Memory loss   . Paranoia ([place])   . Shoulder pain   . Venous insufficiency     Past Surgical History:  Procedure Laterality Date  . CORNEAL TRANSPLANT  2012   Dr Maudie Mercury   . RECONSTRUCTION OF EYELID     eyelid cancer 10 years ago Dr Victorino Dike   . RECONSTRUCTION OF NOSE     nose cancer/ Dr Nyoka Cowden 10 years ago     Allergies  Allergen Reactions  . Novocain [Procaine] Other (See Comments)    Unknown reaction   . Sulfamethoxazole-Trimethoprim Other (See Comments)    Unknown reaction  . Tape Other (See Comments)    Adhesive tape - unknown reaction  . Codeine Other (See Comments)    Unknown reaction  . Neosporin [Neomycin-Bacitracin Zn-Polymyx] Other (See Comments)    Unknown reaction    Outpatient Encounter Medications as of 12/15/2020  Medication Sig  . acetaminophen (TYLENOL) 325 MG tablet Take 650 mg by mouth in the morning, at noon, in the evening, and at bedtime.  Marland Kitchen apixaban (ELIQUIS) 2.5 MG TABS tablet Take 2.5 mg by mouth 2 (two) times daily.  Marland Kitchen denosumab (PROLIA) 60 MG/ML SOSY injection Inject 60 mg into the skin every 6 (six) months. On the 22nd of reach 6th month  . folic acid (FOLVITE) 1 MG tablet Take 1 mg by mouth daily.  Marland Kitchen lactose free nutrition (BOOST PLUS) LIQD Take 237 mLs by mouth daily.  . Lidocaine 4 % PTCH Apply topically. Apply to lower back Q 24hrs Once A Day  . memantine (NAMENDA) 10 MG tablet Take 10 mg by mouth 2 (two) times daily.   . methocarbamol (ROBAXIN) 500 MG tablet Take 250 mg by mouth every 8 (eight) hours as needed for muscle spasms.  . methotrexate (RHEUMATREX) 2.5 MG tablet Take 15  mg by mouth every Friday. Caution:Chemotherapy. Protect from light.  . mineral oil-hydrophilic petrolatum (AQUAPHOR) ointment Apply 1 application topically 2 (two) times daily as needed for dry skin. Apply to forehead, legs, and right mid back  . Polyethyl Glycol-Propyl Glycol (SYSTANE) 0.4-0.3 % SOLN Place 1 drop into both eyes in the morning and at bedtime.   . polyethylene glycol (MIRALAX / GLYCOLAX) 17 g packet Take 17 g by mouth every other day.  . pravastatin (PRAVACHOL) 20 MG tablet Take 20 mg by mouth at bedtime.   . prednisoLONE acetate (PRED FORTE) 1 % ophthalmic suspension Place 1 drop into the right eye daily.  . QUEtiapine (SEROQUEL) 25 MG tablet Take 12.5 mg by mouth 2 (two) times daily.  Marland Kitchen senna-docusate (SENOKOT-S) 8.6-50 MG tablet Take 2 tablets by mouth at  bedtime.  . sertraline (ZOLOFT) 50 MG tablet Take 50 mg by mouth daily.   . sodium chloride (MURO 128) 5 % ophthalmic solution Place 1 drop into both eyes 4 (four) times daily.   No facility-administered encounter medications on file as of 12/15/2020.    Review of Systems:  Review of Systems Dementia  Health Maintenance  Topic Date Due  . DEXA SCAN  Never done  . TETANUS/TDAP  04/19/2023  . INFLUENZA VACCINE  Completed  . COVID-19 Vaccine  Completed  . PNA vac Low Risk Adult  Completed    Physical Exam: Vitals:   12/15/20 1154  BP: 128/72  Pulse: 78  Resp: 14  Temp: 98 F (36.7 C)  SpO2: 96%  Weight: 142 lb 14.4 oz (64.8 kg)  Height: 5\' 6"  (1.676 m)   Body mass index is 23.06 kg/m. Physical Exam  Constitutional:Well-developed and well-nourished.  HENT:  Head: Normocephalic.  Mouth/Throat: Oropharynx is clear and moist.  Eyes: Pupils are equal, round, and reactive to light.  Neck: Neck supple.  Cardiovascular: Normal rate and normal heart sounds.  No murmur heard. Pulmonary/Chest: Effort normal and breath sounds normal. No respiratory distress. No wheezes. She has no rales.  Abdominal: Soft. Bowel sounds are normal. No distension. There is no tenderness. There is no rebound.  Right Inguinal hernia No signs of obstruction Musculoskeletal: No edema.  Lymphadenopathy: none Neurological: No Focal deficitis  Skin: Skin is warm and dry.  Psychiatric: Normal mood and affect. Behavior is normal. Thought content normal.    Labs reviewed: Basic Metabolic Panel: Recent Labs    04/24/20 0000 04/24/20 0000 07/19/20 1900 07/20/20 0104 07/21/20 0342 07/31/20 0000 09/28/20 0000 11/27/20 0000  NA 142   < > 139  --  140 139 140 140  K 4.2  --  3.9  --  4.1 4.2 4.4 5.0  CL 108  --  103  --  108 106 104 108  CO2 29*  --  25  --  25 25* 23* 28*  GLUCOSE  --   --  127*  --  102*  --   --   --   BUN 13   < > 17  --  23 19 14 16   CREATININE 1.0   < > 0.99  --  1.07* 0.9  0.7 0.8  CALCIUM 9.5  --  10.6*  --  9.2 8.7 10.2 8.9  MG  --   --   --  2.2  --   --   --   --   TSH 2.45  --   --   --   --   --   --   --    < > =  values in this interval not displayed.   Liver Function Tests: Recent Labs    07/31/20 0000 09/28/20 0000 11/27/20 0000  AST 19 19 14   ALT 14 10 6*  ALKPHOS 81 70 47  ALBUMIN 3.7 4.0 3.4*   No results for input(s): LIPASE, AMYLASE in the last 8760 hours. No results for input(s): AMMONIA in the last 8760 hours. CBC: Recent Labs    07/19/20 1900 07/21/20 0342 07/31/20 0000 09/28/20 0000 11/27/20 0000  WBC 11.5* 8.0 8.7 8.8 6.1  NEUTROABS 9.4*  --  5,672 5,139.00 3,032.00  HGB 12.5 11.2* 11.6* 12.6 10.8*  HCT 39.3 34.7* 35* 39 33*  MCV 94.9 97.2  --   --   --   PLT 176 153 281 241 204   Lipid Panel: No results for input(s): CHOL, HDL, LDLCALC, TRIG, CHOLHDL, LDLDIRECT in the last 8760 hours. No results found for: HGBA1C  Procedures since last visit: No results found.  Assessment/Plan 1. Right inguinal hernia Not painful Continue to monitor  2. Lab test positive for detection of COVID-19 virus Asymptomatic  In Isolation  3. Vascular dementia without behavioral disturbance (HCC) Supportive care On NAmenda and Seroquel  4. PAF (paroxysmal atrial fibrillation) (HCC) On Eliquis 5. Rheumatoid arthritis, involving unspecified site, unspecified whether rheumatoid factor present (HCC) Continue Methotrexate  6. Weight loss Weight is stable Appetite is poor  7. Depression with anxiety On Zoloft  8. Age-related osteoporosis without current pathological fracture Prolia 9 HLD On statin   Labs/tests ordered:  * No order type specified * Next appt:  Visit date not found

## 2020-12-19 ENCOUNTER — Encounter: Payer: Self-pay | Admitting: Nurse Practitioner

## 2020-12-19 DIAGNOSIS — D638 Anemia in other chronic diseases classified elsewhere: Secondary | ICD-10-CM | POA: Insufficient documentation

## 2020-12-19 DIAGNOSIS — D649 Anemia, unspecified: Secondary | ICD-10-CM | POA: Insufficient documentation

## 2020-12-25 ENCOUNTER — Non-Acute Institutional Stay (SKILLED_NURSING_FACILITY): Payer: Medicare Other | Admitting: Nurse Practitioner

## 2020-12-25 ENCOUNTER — Encounter: Payer: Self-pay | Admitting: Nurse Practitioner

## 2020-12-25 DIAGNOSIS — S32051S Stable burst fracture of fifth lumbar vertebra, sequela: Secondary | ICD-10-CM | POA: Diagnosis not present

## 2020-12-25 DIAGNOSIS — I480 Paroxysmal atrial fibrillation: Secondary | ICD-10-CM

## 2020-12-25 DIAGNOSIS — M069 Rheumatoid arthritis, unspecified: Secondary | ICD-10-CM

## 2020-12-25 DIAGNOSIS — F0150 Vascular dementia without behavioral disturbance: Secondary | ICD-10-CM

## 2020-12-25 DIAGNOSIS — B370 Candidal stomatitis: Secondary | ICD-10-CM | POA: Diagnosis not present

## 2020-12-25 DIAGNOSIS — F418 Other specified anxiety disorders: Secondary | ICD-10-CM

## 2020-12-25 DIAGNOSIS — K5901 Slow transit constipation: Secondary | ICD-10-CM

## 2020-12-25 DIAGNOSIS — M810 Age-related osteoporosis without current pathological fracture: Secondary | ICD-10-CM

## 2020-12-25 NOTE — Assessment & Plan Note (Signed)
L5 sustained from fall,07/19/20,takesRobaxin 250mg q8hr prn, Tylenol.  

## 2020-12-25 NOTE — Assessment & Plan Note (Signed)
Dementia, takes Memantine 10mg bid 

## 2020-12-25 NOTE — Assessment & Plan Note (Signed)
Depression/anxiety, takes Sertraline, Quetiapine,  off Buspar 09/26/20 

## 2020-12-25 NOTE — Progress Notes (Signed)
Location:   [facility] Room Number: 50 Place of Service:  SNF (31) Provider: Lennie Odor Chirstopher Iovino NP  Virgie Dad, MD  Patient Care Team: Virgie Dad, MD as PCP - General (Internal Medicine) Pichardo-Geisinger, Mila Palmer, MD as Referring Physician Nestor Ramp Effie Shy, MD as Referring Physician (Ophthalmology)  Extended Emergency Contact Information Primary Emergency Contact: Key, Tim Address: [address]          [place], [place] [address] [name] of [place] Phone: [phone] Relation: [name] Secondary Emergency Contact: Owens Shark Address: [address]          [place], [place] [address] [name] of [place] Phone: [phone] Mobile Phone: [phone] Relation: Other  Code Status: Full Code Goals of care: Advanced Directive information Advanced Directives 11/26/2020  Does Patient Have a Medical Advance Directive? Yes  Type of Advance Directive Living will  Does patient want to make changes to medical advance directive? No - Patient declined  Copy of [place] in Chart? -  Would patient like information on creating a medical advance directive? -  Pre-existing out of facility DNR order (yellow form or pink MOST form) -     Chief Complaint  Patient presents with  . Acute Visit    Burning sensation of mouth    HPI:  Pt is a 87 y.o. female seen today for an acute visit for c/o burning sensation in her mouth, noted yellow coated tongue, small torus palatinus, no noted lesion, dental or gum issue. No redness in throat. The patient denied cough, chest pain or SOB  L5 sustained from fall, 07/19/20, takesRobaxin 250mg  q8hr prn, Tylenol. Constipation, takes Senna, MiraLax Dementia, takes Memantine 10mg  bid Afib, takes Eliquis 2.5mg  bid Depression/anxiety, takes Sertraline, Quetiapine, off Buspar 09/26/20 RA, takes Methotrexate 15mg  wkly,  Hgb 10.8 11/27/20 OP, takes Prolia, Ca, Vit D                  Past Medical History:  Diagnosis Date  . Acid reflux disease   . Arthritis    rheumatoid  . Atrial fibrillation ([place])   . Back pain   . Bursitis of hip, right   . Cancer ([place])    skin  . Chest pain   . Confusion   . Constipation   . Fuchs' corneal dystrophy   . GERD (gastroesophageal reflux disease)   . Hard of hearing   . Knee pain   . Memory loss   . Paranoia ([place])   . Shoulder pain   . Venous insufficiency    Past Surgical History:  Procedure Laterality Date  . CORNEAL TRANSPLANT  2012   Dr Maudie Mercury   . RECONSTRUCTION OF EYELID     eyelid cancer 10 years ago Dr Victorino Dike   . RECONSTRUCTION OF NOSE     nose cancer/ Dr Nyoka Cowden 10 years ago     Allergies  Allergen Reactions  . Novocain [Procaine] Other (See Comments)    Unknown reaction  . Sulfamethoxazole-Trimethoprim Other (See Comments)    Unknown reaction  . Tape Other (See Comments)    Adhesive tape - unknown reaction  . Codeine Other (See Comments)    Unknown reaction  . Neosporin [Neomycin-Bacitracin Zn-Polymyx] Other (See Comments)    Unknown reaction    Allergies as of 12/25/2020      Reactions   Novocain [procaine] Other (See Comments)   Unknown reaction   Sulfamethoxazole-trimethoprim Other (See Comments)   Unknown reaction   Tape  Other (See Comments)   Adhesive tape - unknown reaction   Codeine Other (See Comments)   Unknown reaction   Neosporin [neomycin-bacitracin Zn-polymyx] Other (See Comments)   Unknown reaction      Medication List       Accurate as of December 25, 2020  2:21 PM. If you have any questions, ask your nurse or doctor.        STOP taking these medications   methocarbamol 500 MG tablet Commonly known as: ROBAXIN Stopped by: Leiana Rund X Tasnim Balentine, NP     TAKE these medications   acetaminophen 325 MG tablet Commonly known as: TYLENOL Take 650 mg by mouth in the morning, at noon, in the evening, and  at bedtime.   apixaban 2.5 MG Tabs tablet Commonly known as: ELIQUIS Take 2.5 mg by mouth 2 (two) times daily.   denosumab 60 MG/ML Sosy injection Commonly known as: PROLIA Inject 60 mg into the skin every 6 (six) months. On the 22nd of reach 6th month   folic acid 1 MG tablet Commonly known as: FOLVITE Take 1 mg by mouth daily.   lactose free nutrition Liqd Take 237 mLs by mouth daily.   Lidocaine 4 % Ptch Apply topically. Apply to lower back Q 24hrs Once A Day   memantine 10 MG tablet Commonly known as: NAMENDA Take 10 mg by mouth 2 (two) times daily.   methotrexate 2.5 MG tablet Commonly known as: RHEUMATREX Take 15 mg by mouth every Friday. Caution:Chemotherapy. Protect from light.   mineral oil-hydrophilic petrolatum ointment Apply 1 application topically 2 (two) times daily as needed for dry skin. Apply to forehead, legs, and right mid back   polyethylene glycol 17 g packet Commonly known as: MIRALAX / GLYCOLAX Take 17 g by mouth every other day.   pravastatin 20 MG tablet Commonly known as: PRAVACHOL Take 20 mg by mouth at bedtime.   prednisoLONE acetate 1 % ophthalmic suspension Commonly known as: PRED FORTE Place 1 drop into the right eye daily.   QUEtiapine 25 MG tablet Commonly known as: SEROQUEL Take 12.5 mg by mouth 2 (two) times daily.   senna-docusate 8.6-50 MG tablet Commonly known as: Senokot-S Take 2 tablets by mouth at bedtime.   sertraline 50 MG tablet Commonly known as: ZOLOFT Take 50 mg by mouth daily.   sodium chloride 5 % ophthalmic solution Commonly known as: MURO 128 Place 1 drop into both eyes 4 (four) times daily.   Systane 0.4-0.3 % Soln Generic drug: Polyethyl Glycol-Propyl Glycol Place 1 drop into both eyes in the morning and at bedtime.       Review of Systems  Constitutional: Negative for activity change, appetite change and fever.  HENT: Positive for hearing loss and mouth sores. Negative for congestion, dental  problem, rhinorrhea, sinus pressure, sinus pain, sore throat, trouble swallowing and voice change.   Eyes: Negative for visual disturbance.  Respiratory: Negative for cough.   Cardiovascular: Positive for leg swelling. Negative for palpitations.  Gastrointestinal: Negative for abdominal pain and constipation.  Genitourinary: Negative for difficulty urinating, dysuria and urgency.  Musculoskeletal: Positive for arthralgias, back pain and gait problem.  Skin: Negative for color change.  Neurological: Negative for speech difficulty, weakness and headaches.       Dementia  Psychiatric/Behavioral: Positive for confusion. Negative for sleep disturbance. The patient is not nervous/anxious.     Immunization History  Administered Date(s) Administered  . Influenza, High Dose Seasonal PF 10/02/2013, 08/25/2015, 09/03/2016, 09/05/2017, 09/12/2018, 08/25/2019  . Influenza-Unspecified  10/01/2014, 09/03/2020  . Moderna Sars-Covid-2 Vaccination 11/24/2019, 12/22/2019, 09/30/2020  . Pneumococcal Conjugate-13 10/21/2014  . Pneumococcal Polysaccharide-23 01/23/2018  . Td 04/18/2013  . Zoster 09/04/2012   Pertinent  Health Maintenance Due  Topic Date Due  . DEXA SCAN  Never done  . INFLUENZA VACCINE  Completed  . PNA vac Low Risk Adult  Completed   No flowsheet data found. Functional Status Survey:    Vitals:   12/25/20 1101  BP: 128/72  Pulse: 68  Temp: 98.1 F (36.7 C)  SpO2: 95%  Weight: 142 lb 3.2 oz (64.5 kg)  Height: 5\' 6"  (1.676 m)   Body mass index is 22.95 kg/m. Physical Exam Vitals and nursing note reviewed.  Constitutional:      Appearance: Normal appearance.  HENT:     Head: Normocephalic and atraumatic.     Nose: Nose normal. No congestion or rhinorrhea.     Mouth/Throat:     Pharynx: No oropharyngeal exudate or posterior oropharyngeal erythema.     Comments: Yellow coated tongue, a small torus palatinus.  Eyes:     Conjunctiva/sclera: Conjunctivae normal.      Pupils: Pupils are equal, round, and reactive to light.  Cardiovascular:     Rate and Rhythm: Normal rate and regular rhythm.  Pulmonary:     Effort: Pulmonary effort is normal.     Breath sounds: No rales.  Abdominal:     General: Bowel sounds are normal.     Palpations: Abdomen is soft.     Tenderness: There is no abdominal tenderness.     Hernia: A hernia is present.     Comments: Right inguinal hernia, reducible, a tennis ball sized   Musculoskeletal:     Cervical back: Normal range of motion and neck supple.     Right lower leg: Edema present.     Left lower leg: Edema present.     Comments: Lower back pain is better. Trace edema BLE  Skin:    General: Skin is warm and dry.  Neurological:     General: No focal deficit present.     Mental Status: She is alert. Mental status is at baseline.     Gait: Gait abnormal.     Comments: Oriented to person, place.   Psychiatric:        Mood and Affect: Mood normal.        Behavior: Behavior normal.     Comments: Pleasantly confused.      Labs reviewed: Recent Labs    07/19/20 1900 07/20/20 0104 07/21/20 0342 07/31/20 0000 09/28/20 0000 11/27/20 0000  NA 139  --  140 139 140 140  K 3.9  --  4.1 4.2 4.4 5.0  CL 103  --  108 106 104 108  CO2 25  --  25 25* 23* 28*  GLUCOSE 127*  --  102*  --   --   --   BUN 17  --  23 19 14 16   CREATININE 0.99  --  1.07* 0.9 0.7 0.8  CALCIUM 10.6*  --  9.2 8.7 10.2 8.9  MG  --  2.2  --   --   --   --    Recent Labs    07/31/20 0000 09/28/20 0000 11/27/20 0000  AST 19 19 14   ALT 14 10 6*  ALKPHOS 81 70 47  ALBUMIN 3.7 4.0 3.4*   Recent Labs    07/19/20 1900 07/21/20 0342 07/31/20 0000 09/28/20 0000 11/27/20 0000  WBC  11.5* 8.0 8.7 8.8 6.1  NEUTROABS 9.4*  --  5,672 5,139.00 3,032.00  HGB 12.5 11.2* 11.6* 12.6 10.8*  HCT 39.3 34.7* 35* 39 33*  MCV 94.9 97.2  --   --   --   PLT 176 153 281 241 204   Lab Results  Component Value Date   TSH 2.45 04/24/2020   No results  found for: HGBA1C Lab Results  Component Value Date   CHOL 141 12/08/2019   HDL 49 12/08/2019   LDLCALC 78 12/08/2019   TRIG 59 12/08/2019    Significant Diagnostic Results in last 30 days:  No results found.  Assessment/Plan: Thrush, oral Yellow coated tongue with burning sensation, will try Magic Mouth Wash 5ml s/s ac and hs x 5 days.   L5 vertebral fracture (HCC) L5 sustained from fall, 07/19/20, takesRobaxin 250mg  q8hr prn, Tylenol.   Slow transit constipation Constipation, takes Senna, MiraLax   Vascular dementia ([place]) Dementia, takes Memantine 10mg  bid   PAF (paroxysmal atrial fibrillation) (HCC) Afib, takes Eliquis 2.5mg  bid   Depression with anxiety Depression/anxiety, takes Sertraline, Quetiapine, off Buspar 09/26/20   Rheumatoid arthritis (HCC) RA, takes Methotrexate 15mg  wkly, Hgb 10.8 11/27/20   Osteoporosis OP, takes Prolia, Ca, Vit D    Family/ staff Communication: plan of care reviewed with the patient and charge nurse.   Labs/tests ordered:  None  Time spend 35 minutes.

## 2020-12-25 NOTE — Assessment & Plan Note (Signed)
Afib, takes Eliquis 2.5mg bid  

## 2020-12-25 NOTE — Assessment & Plan Note (Signed)
Yellow coated tongue with burning sensation, will try Magic Mouth Wash 63ml s/s ac and hs x 5 days.

## 2020-12-25 NOTE — Assessment & Plan Note (Signed)
Constipation, takes Senna, MiraLax °

## 2020-12-25 NOTE — Assessment & Plan Note (Signed)
RA, takes Methotrexate 15mg  wkly, Hgb 10.8 11/27/20

## 2020-12-25 NOTE — Assessment & Plan Note (Signed)
OP, takes Prolia, Ca, Vit D   

## 2021-01-01 IMAGING — CT CT CERVICAL SPINE W/O CM
4 of 7 series · 14 of 34 positions shown, 15 images · non-contrast
Comparison: MRI brain dated May 28, 2017. CT head and cervical
spine dated March 11, 2016.

CLINICAL DATA: Unwitnessed fall.  On Eliquis.

EXAM:
CT HEAD WITHOUT CONTRAST
CT CERVICAL SPINE WITHOUT CONTRAST
TECHNIQUE: Multidetector CT imaging of the head and cervical spine was
performed following the standard protocol without intravenous
contrast. Multiplanar CT image reconstructions of the cervical spine
were also generated.

[Series 4: c spine soft (person_name) · axial · 0.32mm/px · z∈[-241,-173]mm · 3 of 86 slices shown]
[im 18/86  soft-tissue]
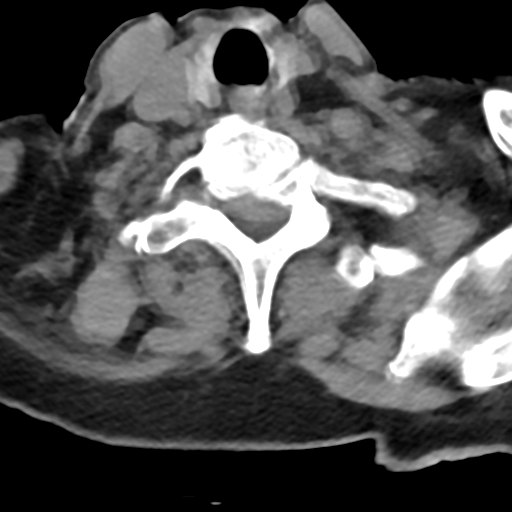
[im 35/86  soft-tissue]
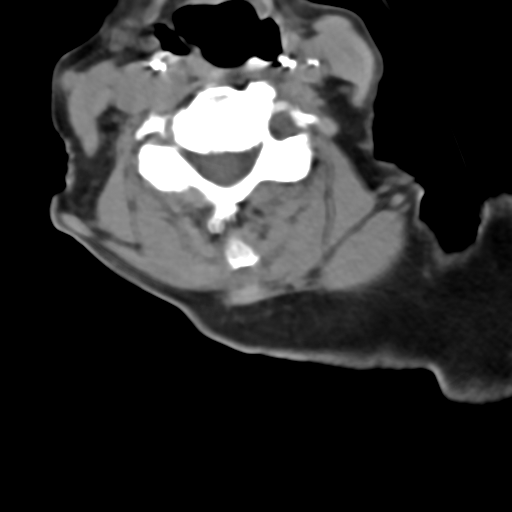
[im 52/86  soft-tissue]
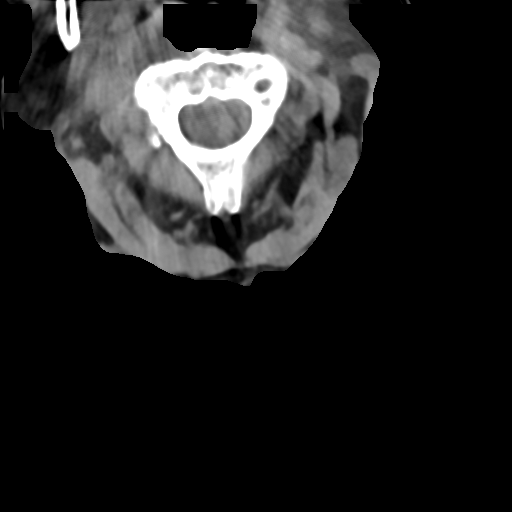

[Series 11: cor bone · coronal · 0.25mm/px · 3 of 70 slices shown]
[im 13/70  bone]
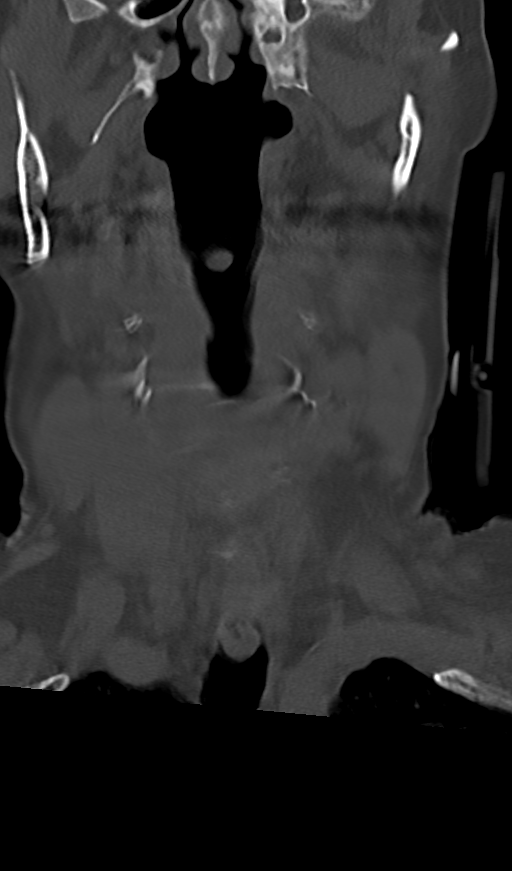
[im 26/70  bone]
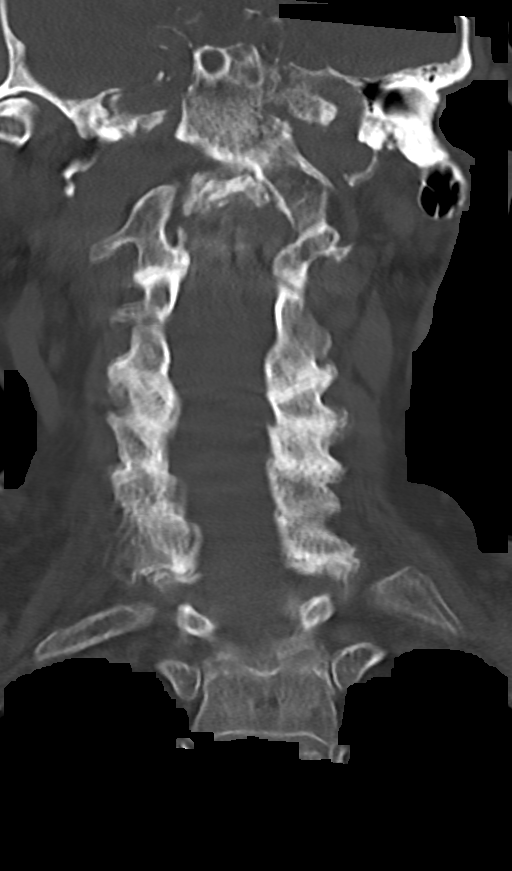
[im 39/70  bone]
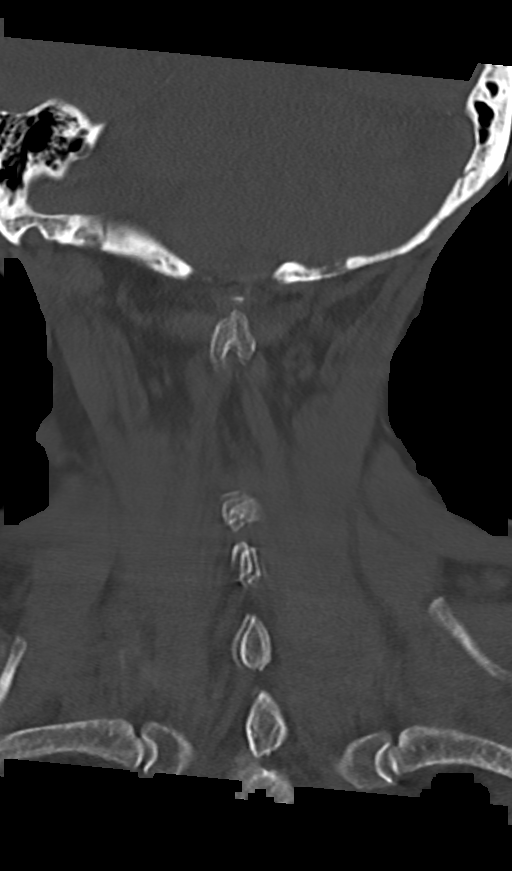

[Series 12: orthogonal axials · axial · 0.21mm/px · z∈[-256,-182]mm · 3 of 71 slices shown, 4 images]
[im 18/71  soft-tissue]
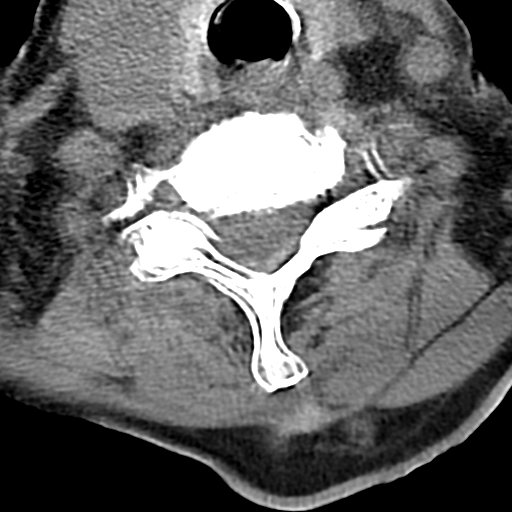
[im 18/71  bone]
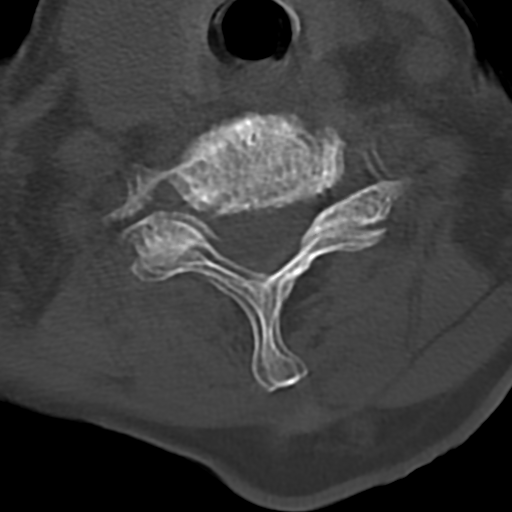
[im 36/71  bone]
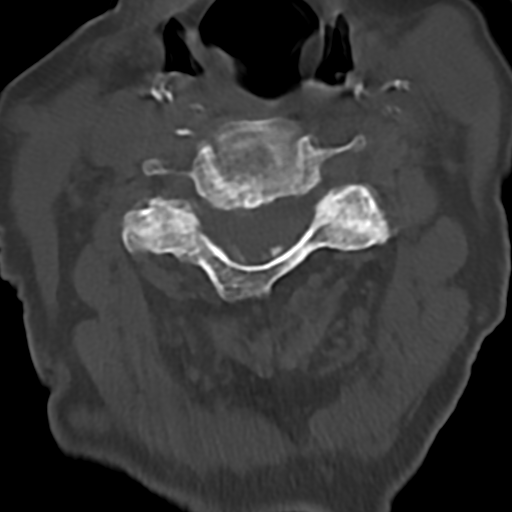
[im 53/71  bone]
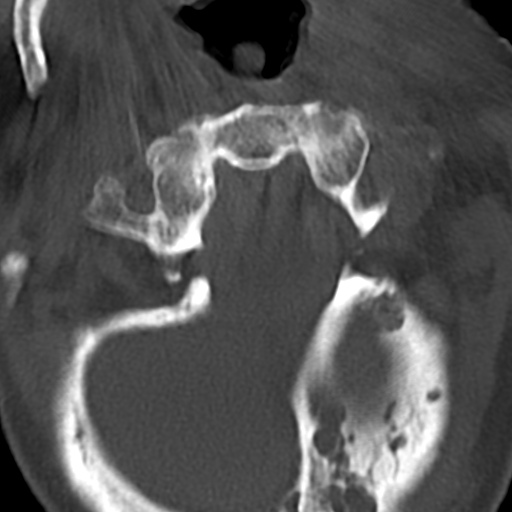

[Series 16: sag bone · sagittal · 0.19mm/px · 5 of 60 slices shown]
[im 18/60  bone]
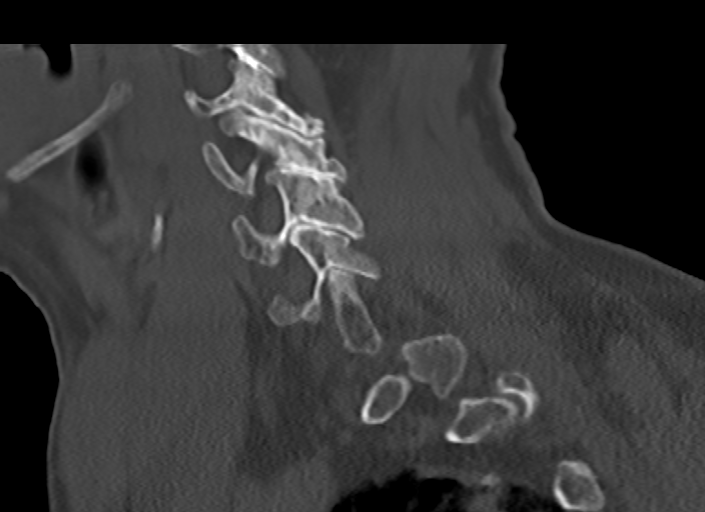
[im 24/60  bone]
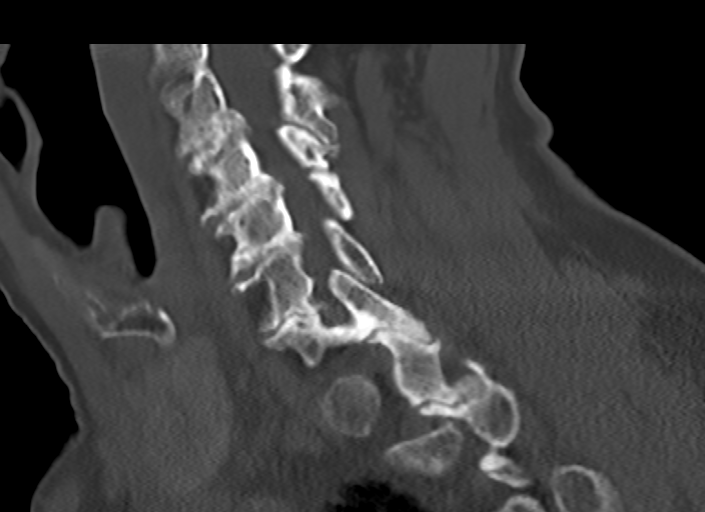
[im 30/60  bone]
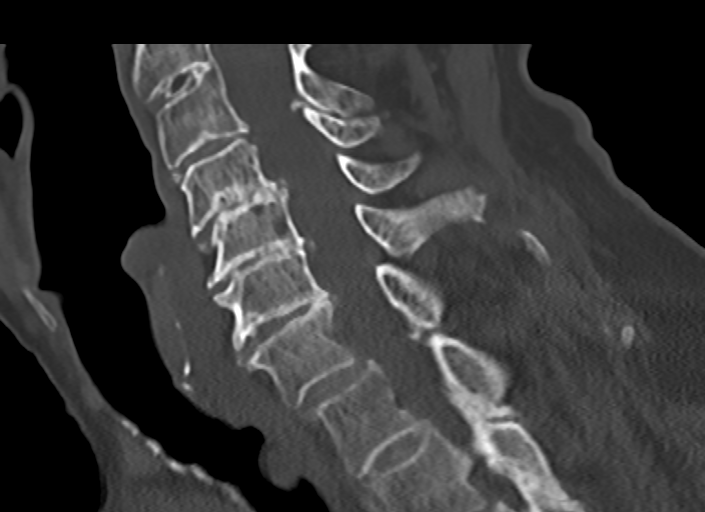
[im 36/60  bone]
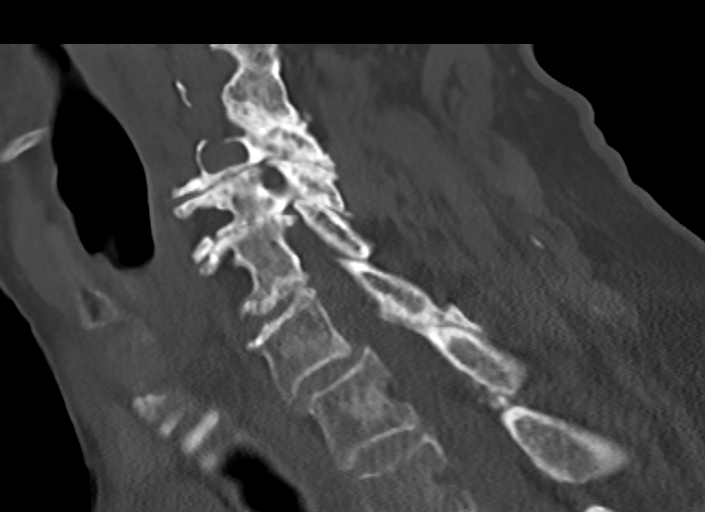
[im 42/60  bone]
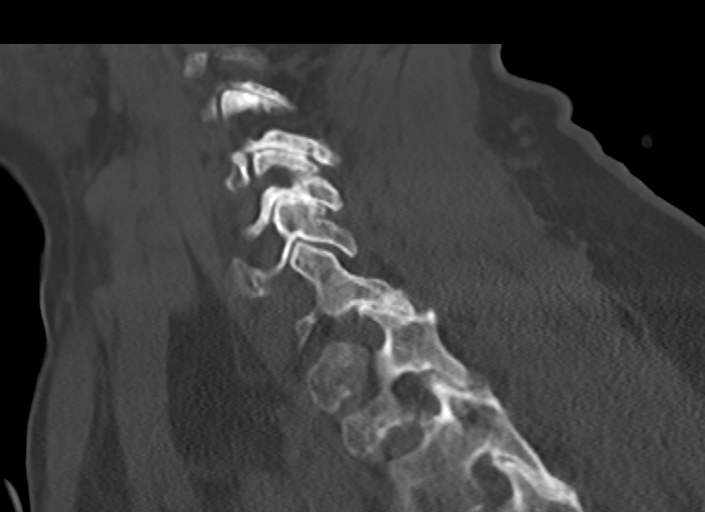

[14 of 34 positions shown; findings below may reference images not displayed]

FINDINGS: CT HEAD FINDINGS

Brain: No evidence of acute infarction, hemorrhage, hydrocephalus,
extra-axial collection or mass lesion/mass effect. Stable atrophy
and chronic microvascular ischemic changes. Slightly asymmetric
hyperdensity near the left hippocampus and temporal horn is favored
to represent choroid plexus.

Vascular: Atherosclerotic vascular calcification of the carotid
siphons. No hyperdense vessel.

Skull: Normal. Negative for fracture or focal lesion.

Sinuses/Orbits: Small air-fluid level in the right sphenoid sinus.
Frothy secretions a left posterior ethmoid air cell. The orbits are
unremarkable.

Other: None.

CT CERVICAL SPINE FINDINGS

Alignment: No traumatic malalignment. Unchanged trace
anterolisthesis at C4-C5 and C7-T1.

Skull base and vertebrae: No acute fracture. No primary bone lesion
or focal pathologic process.

Soft tissues and spinal canal: No prevertebral fluid or swelling. No
visible canal hematoma.

Disc levels: Multilevel disc height loss, severe at C4-C5 and C5-C6.
Moderate to severe facet uncovertebral hypertrophy throughout the
cervical spine. Findings are similar to prior study

Upper chest: Biapical pleuroparenchymal scarring.

Other: Secretions in the trachea.
IMPRESSION: 1. No acute intracranial abnormality. Stable atrophy and chronic
microvascular ischemic changes.
2. No acute cervical spine fracture or traumatic malalignment.
3. Secretions in the trachea. Correlate for aspiration.

## 2021-01-08 ENCOUNTER — Encounter: Payer: Self-pay | Admitting: Internal Medicine

## 2021-01-08 ENCOUNTER — Non-Acute Institutional Stay (SKILLED_NURSING_FACILITY): Payer: Medicare Other | Admitting: Nurse Practitioner

## 2021-01-08 DIAGNOSIS — M0689 Other specified rheumatoid arthritis, multiple sites: Secondary | ICD-10-CM | POA: Diagnosis not present

## 2021-01-08 DIAGNOSIS — F0150 Vascular dementia without behavioral disturbance: Secondary | ICD-10-CM

## 2021-01-08 DIAGNOSIS — F418 Other specified anxiety disorders: Secondary | ICD-10-CM | POA: Diagnosis not present

## 2021-01-08 DIAGNOSIS — I480 Paroxysmal atrial fibrillation: Secondary | ICD-10-CM

## 2021-01-08 DIAGNOSIS — F0151 Vascular dementia with behavioral disturbance: Secondary | ICD-10-CM

## 2021-01-08 DIAGNOSIS — K5901 Slow transit constipation: Secondary | ICD-10-CM

## 2021-01-08 DIAGNOSIS — D638 Anemia in other chronic diseases classified elsewhere: Secondary | ICD-10-CM

## 2021-01-08 DIAGNOSIS — F01518 Vascular dementia, unspecified severity, with other behavioral disturbance: Secondary | ICD-10-CM

## 2021-01-08 DIAGNOSIS — M810 Age-related osteoporosis without current pathological fracture: Secondary | ICD-10-CM

## 2021-01-08 DIAGNOSIS — S32050D Wedge compression fracture of fifth lumbar vertebra, subsequent encounter for fracture with routine healing: Secondary | ICD-10-CM

## 2021-01-08 LAB — LIPID PANEL
Cholesterol: 156 (ref 0–200)
HDL: 34 — AB (ref 35–70)
LDL Cholesterol: 90
LDl/HDL Ratio: 4.6
Triglycerides: 220 — AB (ref 40–160)

## 2021-01-08 NOTE — Assessment & Plan Note (Signed)
takes Senna, MiraLax   

## 2021-01-08 NOTE — Assessment & Plan Note (Signed)
Continue [facility] for supportive care, takes Memantine 10mg  bid

## 2021-01-08 NOTE — Assessment & Plan Note (Signed)
Pain is better controlled, L5 sustained from fall,07/19/20,takesRobaxin 250mg  q8hr prn, Tylenol.

## 2021-01-08 NOTE — Assessment & Plan Note (Signed)
Mood is stable, continue Sertraline, Quetiapine, off Buspar 09/26/20

## 2021-01-08 NOTE — Assessment & Plan Note (Signed)
takes Methotrexate 15mg  wkly, Hgb 10.8 11/27/20

## 2021-01-08 NOTE — Progress Notes (Signed)
This encounter was created in error - please disregard.

## 2021-01-08 NOTE — Assessment & Plan Note (Signed)
Heart rate is in control, continue Eliquis 2.5mg  bid

## 2021-01-09 ENCOUNTER — Encounter: Payer: Self-pay | Admitting: Nurse Practitioner

## 2021-01-09 NOTE — Progress Notes (Signed)
Location:  [facility] Room Number: 50  Place of Service: SNF (31)  Provider: Lendora Keys, Lennie Odor NP   Virgie Dad, MD   Patient Care Team:  Virgie Dad, MD as PCP - General (Internal Medicine)  Pichardo-Geisinger, Mila Palmer, MD as Referring Physician  Nestor Ramp Effie Shy, MD as Referring Physician (Ophthalmology)   Extended Emergency Contact Information  Primary Emergency Contact: Key, Tim  Address: [address]      [place], [place] [address] [name] of [place] Phone: [phone]  Relation: [name]  Secondary Emergency Contact: Owens Shark  Address: [address]      [place], [place] [address] [name] of [place] Phone: [phone]  Mobile Phone: [phone]  Relation: Other   Code Status: Full Code  Goals of care: Advanced Directive information  Advanced Directives 11/26/2020  Does Patient Have a Medical Advance Directive? Yes  Type of Advance Directive Living will  Does patient want to make changes to medical advance directive? No - Patient declined  Copy of [place] in Chart? -  Would patient like information on creating a medical advance directive? -  Pre-existing out of facility DNR order (yellow form or pink MOST form) -     Chief Complaint  Patient presents with   Medical Management of Chronic Issues    HPI:  Pt is a 87 y.o. female seen today for medical management of chronic diseases.     L5 sustained from fall,07/19/20,takesRobaxin 250mg  q8hr prn, Tylenol.  Constipation, takes Senna, MiraLax  Dementia, takes Memantine 10mg  bid  Afib, takes Eliquis 2.5mg  bid  Depression/anxiety, takes Sertraline, Quetiapine, off Buspar 09/26/20  RA, takes Methotrexate 15mg  wkly, Hgb 10.6 12/11/20 OP, takes Prolia, Ca, Vit D   Anemia, Hgb 10.6, Fe 66, Vit B12 1010 0/99/83, takes Folic  acid    Past Medical History:  Diagnosis Date   Acid reflux disease   Arthritis  rheumatoid   Atrial fibrillation (HCC)   Back pain   Bursitis of hip, right   Cancer (HCC)  skin   Chest pain   Confusion   Constipation   Fuchs' corneal dystrophy   GERD (gastroesophageal reflux disease)   Hard of hearing   Knee pain   Memory loss   Paranoia (HCC)   Shoulder pain   Venous insufficiency   Past Surgical History:  Procedure Laterality Date   CORNEAL TRANSPLANT 2012  Dr Maudie Mercury   RECONSTRUCTION OF EYELID  eyelid cancer 10 years ago Dr Victorino Dike   RECONSTRUCTION OF NOSE  nose cancer/ Dr Nyoka Cowden 10 years ago    Allergies  Allergen Reactions   Novocain [Procaine] Other (See Comments)  Unknown reaction   Sulfamethoxazole-Trimethoprim Other (See Comments)  Unknown reaction   Tape Other (See Comments)  Adhesive tape - unknown reaction   Codeine Other (See Comments)  Unknown reaction   Neosporin [Neomycin-Bacitracin Zn-Polymyx] Other (See Comments)  Unknown reaction    Allergies as of 01/08/2021  Reactions  Novocain [procaine] Other (See Comments)  Unknown reaction  Sulfamethoxazole-trimethoprim Other (See Comments)  Unknown reaction  Tape Other (See Comments)  Adhesive tape - unknown reaction  Codeine Other (See Comments)  Unknown reaction  Neosporin [neomycin-bacitracin Zn-polymyx] Other (See Comments)  Unknown reaction    Medication List    Accurate as of January 08, 2021 11:55 AM. If you have any questions, ask your nurse or doctor.     acetaminophen 325 MG tablet  Commonly known as: TYLENOL  Take 650 mg by mouth in the morning, at noon, in the evening, and at bedtime.   apixaban 2.5 MG Tabs tablet  Commonly known as: ELIQUIS  Take 2.5 mg by mouth 2 (two) times daily.   denosumab 60 MG/ML Sosy injection  Commonly known as: PROLIA  Inject 60 mg into the skin every 6 (six) months. On the 22nd of reach 6th month    folic acid 1 MG tablet  Commonly known as: FOLVITE  Take 1 mg by mouth daily.   lactose free nutrition Liqd  Take 237 mLs by mouth daily.   Lidocaine 4 % Ptch  Apply topically. Apply to lower back Q 24hrs  Once A Day   memantine 10 MG tablet  Commonly known as: NAMENDA  Take 10 mg by mouth 2 (two) times daily.   methotrexate 2.5 MG tablet  Commonly known as: RHEUMATREX  Take 15 mg by mouth every Friday. Caution:Chemotherapy. Protect from light.   mineral oil-hydrophilic petrolatum ointment  Apply 1 application topically 2 (two) times daily as needed for dry skin. Apply to forehead, legs, and right mid back   polyethylene glycol 17 g packet  Commonly known as: MIRALAX / GLYCOLAX  Take 17 g by mouth every other day.   pravastatin 20 MG tablet  Commonly known as: PRAVACHOL  Take 20 mg by mouth at bedtime.   prednisoLONE acetate 1 % ophthalmic suspension  Commonly known as: PRED FORTE  Place 1 drop into the right eye daily.   QUEtiapine 25 MG tablet  Commonly known as: SEROQUEL  Take 12.5 mg by mouth 2 (two) times daily.   senna-docusate 8.6-50 MG tablet  Commonly known as: Senokot-S  Take 2 tablets by mouth at bedtime.   sertraline 50 MG tablet  Commonly known as: ZOLOFT  Take 50 mg by mouth daily.   sodium chloride 5 % ophthalmic solution  Commonly known as: MURO 128  Place 1 drop into both eyes 4 (four) times daily.   Systane 0.4-0.3 % Soln  Generic drug: Polyethyl Glycol-Propyl Glycol  Place 1 drop into both eyes in the morning and at bedtime.      Review of Systems  Constitutional: Negative for fatigue, fever and unexpected weight change.  HENT: Negative for congestion, trouble swallowing and voice change.   Eyes: Negative for visual disturbance.  Respiratory: Negative for cough.   Cardiovascular: Positive for leg swelling.  Gastrointestinal: Negative for abdominal pain and constipation.  Genitourinary: Negative for difficulty urinating, dysuria  and urgency.  Musculoskeletal: Positive for arthralgias, back pain and gait problem.  Skin: Negative for color change.  Neurological: Negative for speech difficulty, weakness and headaches.    Dementia  Psychiatric/Behavioral: Positive for confusion. Negative for sleep disturbance. The patient is not nervous/anxious.     Immunization History  Administered Date(s) Administered   Influenza, High Dose Seasonal PF 10/02/2013, 08/25/2015, 09/03/2016, 09/05/2017, 09/12/2018, 08/25/2019   Influenza-Unspecified 10/01/2014, 09/03/2020   Moderna Sars-Covid-2 Vaccination 11/24/2019, 12/22/2019, 09/30/2020   Pneumococcal Conjugate-13 10/21/2014   Pneumococcal Polysaccharide-23 01/23/2018   Td 04/18/2013   Zoster 09/04/2012   Pertinent Health Maintenance Due  Topic Date Due   DEXA SCAN Never done   INFLUENZA VACCINE Completed   PNA vac Low Risk Adult Completed   No flowsheet data found.  Functional Status Survey:     Vitals:  01/08/21 1051  BP: 124/68  Pulse: 78  Temp: 98.1 F (36.7 C)  SpO2: 93%  Weight: 142 lb 3.2 oz (64.5 kg)  Height: 5'  6" (1.676 m)   Body mass index is 22.95 kg/m.  Physical Exam  Vitals and nursing note reviewed.  Constitutional:    Appearance: Normal appearance.  HENT:   Head: Normocephalic and atraumatic.   Nose: Nose normal.   Mouth/Throat:   Mouth: Mucous membranes are moist.  Eyes:   Extraocular Movements: Extraocular movements intact.   Conjunctiva/sclera: Conjunctivae normal.   Pupils: Pupils are equal, round, and reactive to light.  Cardiovascular:   Rate and Rhythm: Normal rate and regular rhythm.  Pulmonary:   Effort: Pulmonary effort is normal.   Breath sounds: No rales.  Abdominal:   General: Bowel sounds are normal.   Palpations: Abdomen is soft.   Tenderness: There is no abdominal tenderness.   Hernia: A hernia is present.   Comments: Right inguinal hernia, reducible, a tennis ball  sized   Musculoskeletal:   Cervical back: Normal range of motion and neck supple.   Right lower leg: Edema present.   Left lower leg: Edema present.   Comments: Lower back pain is better. Trace edema BLE  Skin:   General: Skin is warm and dry.  Neurological:   General: No focal deficit present.   Mental Status: She is alert. Mental status is at baseline.   Gait: Gait abnormal.   Comments: Oriented to person, place.   Psychiatric:     Mood and Affect: Mood normal.     Behavior: Behavior normal.   Comments: Pleasantly confused.      Labs reviewed:  Recent Labs   07/19/20  1900 07/20/20  0104 07/21/20  0342 07/31/20  0000 09/28/20  0000 11/27/20  0000  NA 139 -- 140 139 140 140  K 3.9 -- 4.1 4.2 4.4 5.0  CL 103 -- 108 106 104 108  CO2 25 -- 25 25* 23* 28*  GLUCOSE 127* -- 102* -- -- --  BUN 17 -- 23 19 14 16   CREATININE 0.99 -- 1.07* 0.9 0.7 0.8  CALCIUM 10.6* -- 9.2 8.7 10.2 8.9  MG -- 2.2 -- -- -- --   Recent Labs   07/31/20  0000 09/28/20  0000 11/27/20  0000  AST 19 19 14   ALT 14 10 6*  ALKPHOS 81 70 47  ALBUMIN 3.7 4.0 3.4*   Recent Labs   07/19/20  1900 07/21/20  0342 07/31/20  0000 09/28/20  0000 11/27/20  0000  WBC 11.5* 8.0 8.7 8.8 6.1  NEUTROABS 9.4* -- 5,672 5,139.00 3,032.00  HGB 12.5 11.2* 11.6* 12.6 10.8*  HCT 39.3 34.7* 35* 39 33*  MCV 94.9 97.2 -- -- --  PLT 176 153 281 241 204   Lab Results  Component Value Date  TSH 2.45 04/24/2020   No results found for: HGBA1C  Lab Results  Component Value Date  CHOL 141 12/08/2019  HDL 49 12/08/2019  LDLCALC 78 12/08/2019  TRIG 59 12/08/2019    Significant Diagnostic Results in last 30 days:  No results found.   Assessment/Plan  Anemia, chronic disease Hgb 10.6, Fe 66, Vit B12 1010 9/37/16, takes Folic acid  Osteoporosis takes Prolia, Ca, Vit D   Other specified rheumatoid arthritis, multiple sites ([place]) takes Methotrexate 15mg  wkly, Hgb  10.6 12/11/20  Depression with anxiety takes Sertraline, Quetiapine, off Buspar 09/26/20   PAF (paroxysmal atrial fibrillation) (HCC) Heart rate is in control, takes Eliquis 2.5mg  bid   Vascular dementia (HCC) Stable, takes Memantine 10mg  bid   Slow transit constipation  takes Senna, MiraLax   L5 vertebral fracture ([place])  Healing, minimal pain, uses w/c for mobility, takesRobaxin 250mg  q8hr prn, Tylenol.      Family/ staff Communication: plan of care reviewed with the patient and charge nurse.   Labs/tests ordered:   none  Time spend 35 minutes.

## 2021-01-12 ENCOUNTER — Encounter: Payer: Self-pay | Admitting: Nurse Practitioner

## 2021-01-12 ENCOUNTER — Non-Acute Institutional Stay (SKILLED_NURSING_FACILITY): Payer: Medicare Other | Admitting: Nurse Practitioner

## 2021-01-12 DIAGNOSIS — F0151 Vascular dementia with behavioral disturbance: Secondary | ICD-10-CM

## 2021-01-12 DIAGNOSIS — S0003XA Contusion of scalp, initial encounter: Secondary | ICD-10-CM | POA: Diagnosis not present

## 2021-01-12 DIAGNOSIS — M810 Age-related osteoporosis without current pathological fracture: Secondary | ICD-10-CM

## 2021-01-12 DIAGNOSIS — F418 Other specified anxiety disorders: Secondary | ICD-10-CM

## 2021-01-12 DIAGNOSIS — M0689 Other specified rheumatoid arthritis, multiple sites: Secondary | ICD-10-CM

## 2021-01-12 DIAGNOSIS — F01518 Vascular dementia, unspecified severity, with other behavioral disturbance: Secondary | ICD-10-CM

## 2021-01-12 DIAGNOSIS — K5901 Slow transit constipation: Secondary | ICD-10-CM

## 2021-01-12 DIAGNOSIS — I480 Paroxysmal atrial fibrillation: Secondary | ICD-10-CM

## 2021-01-12 DIAGNOSIS — S32050D Wedge compression fracture of fifth lumbar vertebra, subsequent encounter for fracture with routine healing: Secondary | ICD-10-CM

## 2021-01-12 DIAGNOSIS — D638 Anemia in other chronic diseases classified elsewhere: Secondary | ICD-10-CM

## 2021-01-12 DIAGNOSIS — R296 Repeated falls: Secondary | ICD-10-CM | POA: Diagnosis not present

## 2021-01-12 NOTE — Assessment & Plan Note (Signed)
Stable, takes Memantine 10mg  bid

## 2021-01-12 NOTE — Assessment & Plan Note (Signed)
takes Methotrexate 15mg wkly, Hgb 10.6 12/11/20 

## 2021-01-12 NOTE — Assessment & Plan Note (Signed)
takes Senna, MiraLax   

## 2021-01-12 NOTE — Assessment & Plan Note (Signed)
takes Sertraline, Quetiapine,  off Buspar 09/26/20  

## 2021-01-12 NOTE — Progress Notes (Signed)
Location:    [facility] Room Number: 50 Place of Service:  SNF (31) Provider: Lennie Odor Almyra Birman NP  Virgie Dad, MD  Patient Care Team: Virgie Dad, MD as PCP - General (Internal Medicine) Pichardo-Geisinger, Mila Palmer, MD as Referring Physician Nestor Ramp Effie Shy, MD as Referring Physician (Ophthalmology)  Extended Emergency Contact Information Primary Emergency Contact: Key, Tim Address: [address]          [place], [place] [address] [name] of [place] Phone: [phone] Relation: [name] Secondary Emergency Contact: Owens Shark Address: [address]          [place], [place] [address] [name] of [place] Phone: [phone] Mobile Phone: [phone] Relation: Other  Code Status: DNR Goals of care: Advanced Directive information Advanced Directives 11/26/2020  Does Patient Have a Medical Advance Directive? Yes  Type of Advance Directive Living will  Does patient want to make changes to medical advance directive? No - Patient declined  Copy of [place] in Chart? -  Would patient like information on creating a medical advance directive? -  Pre-existing out of facility DNR order (yellow form or pink MOST form) -     Chief Complaint  Patient presents with  . Acute Visit    Scalp hematoma, fall    HPI:  Pt is a 87 y.o. female seen today for an acute visit for s/p fall, scalp hematoma inferior lleft parietal, no focal neurological deficit noted    L5 sustained from fall,07/19/20,takesRobaxin 250mg  q8hr prn, Tylenol.  Constipation, takes Senna, MiraLax  Dementia, takes Memantine 10mg  bid  Afib, takes Eliquis 2.5mg  bid  Depression/anxiety, takes Sertraline, Quetiapine, off Buspar 09/26/20  RA, takes Methotrexate 15mg  wkly, Hgb 10.6 12/11/20 OP, takes Prolia, Ca, Vit D              Anemia, Hgb 10.6, Fe 66, Vit  B12 1010 2/84/13, takes Folic acid   Past Medical History:  Diagnosis Date  . Acid reflux disease   . Arthritis    rheumatoid  . Atrial fibrillation ([place])   . Back pain   . Bursitis of hip, right   . Cancer ([place])    skin  . Chest pain   . Confusion   . Constipation   . Fuchs' corneal dystrophy   . GERD (gastroesophageal reflux disease)   . Hard of hearing   . Knee pain   . Memory loss   . Paranoia ([place])   . Shoulder pain   . Venous insufficiency    Past Surgical History:  Procedure Laterality Date  . CORNEAL TRANSPLANT  2012   Dr Maudie Mercury   . RECONSTRUCTION OF EYELID     eyelid cancer 10 years ago Dr Victorino Dike   . RECONSTRUCTION OF NOSE     nose cancer/ Dr Nyoka Cowden 10 years ago     Allergies  Allergen Reactions  . Novocain [Procaine] Other (See Comments)    Unknown reaction  . Sulfamethoxazole-Trimethoprim Other (See Comments)    Unknown reaction  . Tape Other (See Comments)    Adhesive tape - unknown reaction  . Codeine Other (See Comments)    Unknown reaction  . Neosporin [Neomycin-Bacitracin Zn-Polymyx] Other (See Comments)    Unknown reaction    Allergies as of 01/12/2021      Reactions   Novocain [procaine] Other (See Comments)   Unknown reaction   Sulfamethoxazole-trimethoprim Other (See Comments)   Unknown reaction   Tape Other (See Comments)   Adhesive  tape - unknown reaction   Codeine Other (See Comments)   Unknown reaction   Neosporin [neomycin-bacitracin Zn-polymyx] Other (See Comments)   Unknown reaction      Medication List       Accurate as of January 12, 2021 11:59 PM. If you have any questions, ask your nurse or doctor.        acetaminophen 325 MG tablet Commonly known as: TYLENOL Take 650 mg by mouth in the morning, at noon, in the evening, and at bedtime.   apixaban 2.5 MG Tabs tablet Commonly known as: ELIQUIS Take 2.5 mg by mouth 2 (two) times daily.   denosumab 60 MG/ML Sosy injection Commonly known as: PROLIA Inject 60 mg  into the skin every 6 (six) months. On the 22nd of reach 6th month   folic acid 1 MG tablet Commonly known as: FOLVITE Take 1 mg by mouth daily.   lactose free nutrition Liqd Take 237 mLs by mouth daily.   Lidocaine 4 % Ptch Apply topically. Apply to lower back Q 24hrs Once A Day   memantine 10 MG tablet Commonly known as: NAMENDA Take 10 mg by mouth 2 (two) times daily.   methotrexate 2.5 MG tablet Commonly known as: RHEUMATREX Take 15 mg by mouth every Friday. Caution:Chemotherapy. Protect from light.   mineral oil-hydrophilic petrolatum ointment Apply 1 application topically 2 (two) times daily as needed for dry skin. Apply to forehead, legs, and right mid back   polyethylene glycol 17 g packet Commonly known as: MIRALAX / GLYCOLAX Take 17 g by mouth every other day.   pravastatin 20 MG tablet Commonly known as: PRAVACHOL Take 20 mg by mouth at bedtime.   prednisoLONE acetate 1 % ophthalmic suspension Commonly known as: PRED FORTE Place 1 drop into the right eye daily.   QUEtiapine 25 MG tablet Commonly known as: SEROQUEL Take 12.5 mg by mouth 2 (two) times daily.   senna-docusate 8.6-50 MG tablet Commonly known as: Senokot-S Take 2 tablets by mouth at bedtime.   sertraline 50 MG tablet Commonly known as: ZOLOFT Take 50 mg by mouth daily.   sodium chloride 5 % ophthalmic solution Commonly known as: MURO 128 Place 1 drop into both eyes 4 (four) times daily.   Systane 0.4-0.3 % Soln Generic drug: Polyethyl Glycol-Propyl Glycol Place 1 drop into both eyes in the morning and at bedtime.       Review of Systems  Constitutional: Negative for activity change, appetite change and fever.  HENT: Positive for hearing loss. Negative for congestion, mouth sores and trouble swallowing.   Eyes: Negative for visual disturbance.  Respiratory: Negative for cough.   Cardiovascular: Positive for leg swelling. Negative for palpitations.  Gastrointestinal: Negative for  abdominal pain and constipation.  Genitourinary: Negative for difficulty urinating, dysuria and urgency.  Musculoskeletal: Positive for arthralgias, back pain and gait problem.  Skin: Positive for wound. Negative for color change.  Neurological: Negative for speech difficulty, weakness and headaches.       Dementia  Psychiatric/Behavioral: Positive for confusion. Negative for sleep disturbance. The patient is not nervous/anxious.     Immunization History  Administered Date(s) Administered  . Influenza, High Dose Seasonal PF 10/02/2013, 08/25/2015, 09/03/2016, 09/05/2017, 09/12/2018, 08/25/2019  . Influenza-Unspecified 10/01/2014, 09/03/2020  . [place]a Sars-Covid-2 Vaccination 11/24/2019, 12/22/2019, 09/30/2020  . Pneumococcal Conjugate-13 10/21/2014  . Pneumococcal Polysaccharide-23 01/23/2018  . Td 04/18/2013  . Zoster 09/04/2012   Pertinent  Health Maintenance Due  Topic Date Due  . DEXA SCAN  Never  done  . INFLUENZA VACCINE  Completed  . PNA vac Low Risk Adult  Completed   No flowsheet data found. Functional Status Survey:    Vitals:   01/12/21 1605  BP: (!) 120/58  Pulse: 73  Resp: 18  Temp: 97.8 F (36.6 C)  SpO2: 94%  Weight: 142 lb 3.2 oz (64.5 kg)  Height: 5\' 6"  (1.676 m)   Body mass index is 22.95 kg/m. Physical Exam Vitals and nursing note reviewed.  Constitutional:      Appearance: Normal appearance.  HENT:     Head: Normocephalic and atraumatic.     Nose: Nose normal. No congestion or rhinorrhea.     Mouth/Throat:     Pharynx: No oropharyngeal exudate or posterior oropharyngeal erythema.     Comments: Yellow coated tongue, a small torus palatinus.  Eyes:     Conjunctiva/sclera: Conjunctivae normal.     Pupils: Pupils are equal, round, and reactive to light.  Cardiovascular:     Rate and Rhythm: Normal rate and regular rhythm.  Pulmonary:     Effort: Pulmonary effort is normal.     Breath sounds: No rales.  Abdominal:     General: Bowel sounds  are normal.     Palpations: Abdomen is soft.     Tenderness: There is no abdominal tenderness.     Hernia: A hernia is present.     Comments: Right inguinal hernia, reducible, a tennis ball sized   Musculoskeletal:     Cervical back: Normal range of motion and neck supple.     Right lower leg: Edema present.     Left lower leg: Edema present.     Comments: Lower back pain is better. Trace edema BLE  Skin:    General: Skin is warm and dry.     Findings: Bruising present.     Comments: A ping pong ball sized scalp hematoma/abrasion at the inferior parietal scalp, approximated with steri strips, no s/s of bleeding.   Neurological:     General: No focal deficit present.     Mental Status: She is alert. Mental status is at baseline.     Gait: Gait abnormal.     Comments: Oriented to person, place.   Psychiatric:        Mood and Affect: Mood normal.        Behavior: Behavior normal.     Comments: Pleasantly confused.      Labs reviewed: Recent Labs    07/19/20 1900 07/20/20 0104 07/21/20 0342 07/31/20 0000 09/28/20 0000 11/27/20 0000  NA 139  --  140 139 140 140  K 3.9  --  4.1 4.2 4.4 5.0  CL 103  --  108 106 104 108  CO2 25  --  25 25* 23* 28*  GLUCOSE 127*  --  102*  --   --   --   BUN 17  --  23 19 14 16   CREATININE 0.99  --  1.07* 0.9 0.7 0.8  CALCIUM 10.6*  --  9.2 8.7 10.2 8.9  MG  --  2.2  --   --   --   --    Recent Labs    07/31/20 0000 09/28/20 0000 11/27/20 0000  AST 19 19 14   ALT 14 10 6*  ALKPHOS 81 70 47  ALBUMIN 3.7 4.0 3.4*   Recent Labs    07/19/20 1900 07/21/20 0342 07/31/20 0000 09/28/20 0000 11/27/20 0000 12/11/20 0000  WBC 11.5* 8.0   < >  8.8 6.1 5.8  NEUTROABS 9.4*  --    < > 5,139.00 3,032.00 2,378.00  HGB 12.5 11.2*   < > 12.6 10.8* 10.6*  HCT 39.3 34.7*   < > 39 33* 33*  MCV 94.9 97.2  --   --   --   --   PLT 176 153   < > 241 204 205   < > = values in this interval not displayed.   Lab Results  Component Value Date   TSH  2.45 04/24/2020   No results found for: HGBA1C Lab Results  Component Value Date   CHOL 141 12/08/2019   HDL 49 12/08/2019   LDLCALC 78 12/08/2019   TRIG 59 12/08/2019    Significant Diagnostic Results in last 30 days:  No results found.  Assessment/Plan: Frequent falls Feel, mechanical, resulted in left parietal scalp hematoma, steri strips applied, no s/s of bleeding, continue Neuro check per protocol. Lack of safety awareness, impulsive, transfer w/o calling for assistance are contributory, close supervision and assistance for safety.   Scalp hematoma, initial encounter The inferior  left parietal scalp hematoma, steri strips applied, no s/s of bleeding. Observe.   Vascular dementia ([place]) Continue SNF [facility] for supportive care, takes Memantine 10mg  bid   PAF (paroxysmal atrial fibrillation) (HCC) takes Eliquis 2.5mg  bid   L5 vertebral fracture (HCC) L5 sustained from fall,07/19/20,takesRobaxin 250mg  q8hr prn, Tylenol.   Slow transit constipation  takes Senna, MiraLax   Depression with anxiety  takes Sertraline, Quetiapine, off Buspar 09/26/20   Other specified rheumatoid arthritis, multiple sites ([place]) takes Methotrexate 15mg  wkly, Hgb 10.6 12/11/20  Osteoporosis takes Prolia, Ca, Vit D   Anemia, chronic disease  Hgb 10.6, Fe 66, Vit B12 1010 2/64/15, takes Folic acid      Family/ staff Communication: plan of care reviewed with the patient and charge nurse.   Labs/tests ordered:  none  Time spend 35 minutes.

## 2021-01-12 NOTE — Assessment & Plan Note (Signed)
takes Prolia, Ca, Vit D 

## 2021-01-12 NOTE — Assessment & Plan Note (Signed)
Hgb 10.6, Fe 66, Vit B12 1010 6/72/89, takes Folic acid

## 2021-01-12 NOTE — Assessment & Plan Note (Signed)
Heart rate is in control, takes Eliquis 2.5mg bid  

## 2021-01-12 NOTE — Assessment & Plan Note (Signed)
Healing, minimal pain, uses w/c for mobility, takesRobaxin 250mg  q8hr prn, Tylenol.

## 2021-01-13 ENCOUNTER — Encounter: Payer: Self-pay | Admitting: Nurse Practitioner

## 2021-01-13 DIAGNOSIS — S0003XA Contusion of scalp, initial encounter: Secondary | ICD-10-CM | POA: Insufficient documentation

## 2021-01-13 NOTE — Assessment & Plan Note (Signed)
takes Eliquis 2.5mg bid 

## 2021-01-13 NOTE — Assessment & Plan Note (Signed)
The inferior  left parietal scalp hematoma, steri strips applied, no s/s of bleeding. Observe.

## 2021-01-13 NOTE — Assessment & Plan Note (Signed)
Hgb 10.6, Fe 66, Vit B12 1010 12/11/20, takes Folic acid    

## 2021-01-13 NOTE — Assessment & Plan Note (Signed)
takes Methotrexate 15mg wkly, Hgb 10.6 12/11/20 

## 2021-01-13 NOTE — Assessment & Plan Note (Signed)
takes Senna, MiraLax   

## 2021-01-13 NOTE — Assessment & Plan Note (Signed)
takes Prolia, Ca, Vit D 

## 2021-01-13 NOTE — Assessment & Plan Note (Signed)
takes Sertraline, Quetiapine,  off Buspar 09/26/20  

## 2021-01-13 NOTE — Assessment & Plan Note (Signed)
Continue [facility] for supportive care, takes Memantine 10mg  bid

## 2021-01-13 NOTE — Assessment & Plan Note (Signed)
L5 sustained from fall,07/19/20,takesRobaxin 250mg  q8hr prn, Tylenol.

## 2021-01-13 NOTE — Assessment & Plan Note (Signed)
Feel, mechanical, resulted in left parietal scalp hematoma, steri strips applied, no s/s of bleeding, continue Neuro check per protocol. Lack of safety awareness, impulsive, transfer w/o calling for assistance are contributory, close supervision and assistance for safety.

## 2021-03-04 ENCOUNTER — Encounter: Payer: Self-pay | Admitting: Nurse Practitioner

## 2021-03-04 ENCOUNTER — Non-Acute Institutional Stay (SKILLED_NURSING_FACILITY): Payer: Medicare Other | Admitting: Nurse Practitioner

## 2021-03-04 DIAGNOSIS — I480 Paroxysmal atrial fibrillation: Secondary | ICD-10-CM | POA: Diagnosis not present

## 2021-03-04 DIAGNOSIS — F01518 Vascular dementia, unspecified severity, with other behavioral disturbance: Secondary | ICD-10-CM

## 2021-03-04 DIAGNOSIS — K5901 Slow transit constipation: Secondary | ICD-10-CM

## 2021-03-04 DIAGNOSIS — F0151 Vascular dementia with behavioral disturbance: Secondary | ICD-10-CM

## 2021-03-04 DIAGNOSIS — M810 Age-related osteoporosis without current pathological fracture: Secondary | ICD-10-CM

## 2021-03-04 DIAGNOSIS — S32050D Wedge compression fracture of fifth lumbar vertebra, subsequent encounter for fracture with routine healing: Secondary | ICD-10-CM

## 2021-03-04 DIAGNOSIS — F418 Other specified anxiety disorders: Secondary | ICD-10-CM | POA: Diagnosis not present

## 2021-03-04 DIAGNOSIS — D638 Anemia in other chronic diseases classified elsewhere: Secondary | ICD-10-CM

## 2021-03-04 DIAGNOSIS — M0689 Other specified rheumatoid arthritis, multiple sites: Secondary | ICD-10-CM

## 2021-03-04 NOTE — Progress Notes (Signed)
Location:   [facility] Room Number: [place] of Service:  SNF (31) Provider:  Alyxander Kollmann, NP    Patient Care Team: Virgie Dad, MD as PCP - General (Internal Medicine) Pichardo-Geisinger, Mila Palmer, MD as Referring Physician Nestor Ramp, Effie Shy, MD as Referring Physician (Ophthalmology)  Extended Emergency Contact Information Primary Emergency Contact: Key, Tim Address: [address]          [place], [place] [address] [name] of [place] Phone: [phone] Relation: Nephew Secondary Emergency Contact: Owens Shark Address: [address]          [place], [place] [address] [name] of [place] Phone: [phone] Mobile Phone: [phone] Relation: Other  Code Status: DNR  Goals of care: Advanced Directive information Advanced Directives 03/04/2021  Does Patient Have a Medical Advance Directive? No  Type of Paramedic of [place];Living will;Out of facility DNR (pink MOST or yellow form)  Does patient want to make changes to medical advance directive? No - Patient declined  Copy of [place] in Chart? Yes - validated most recent copy scanned in chart (See row information)  Would patient like information on creating a medical advance directive? -  Pre-existing out of facility DNR order (yellow form or pink MOST form) -     Chief Complaint  Patient presents with  . Medical Management of Chronic Issues    Routine Visit.   Marland Kitchen Health Maintenance    Discuss the need for Dexa Scan.    HPI:  Pt is a 87 y.o. female seen today for medical management of chronic diseases.    L5 sustained from fall,07/19/20,takesRobaxin 250mg  q8hr prn, Tylenol.  Constipation, takes Senna, MiraLax  Dementia, takes Memantine 10mg  bid  Afib, takes Eliquis 2.5mg  bid  Depression/anxiety, takes Sertraline, Quetiapine, off Buspar 09/26/20   RA, takes Methotrexate 15mg  wkly, Hgb 10.6 12/11/20 OP, takes Prolia, Ca, Vit D              Anemia, Hgb 10.6, Fe 66, Vit B12 1010 11/23/70, takes Folic acid     Past Medical History:  Diagnosis Date  . Acid reflux disease   . Arthritis    rheumatoid  . Atrial fibrillation ([place])   . Back pain   . Bursitis of hip, right   . Cancer ([place])    skin  . Chest pain   . Confusion   . Constipation   . Fuchs' corneal dystrophy   . GERD (gastroesophageal reflux disease)   . Hard of hearing   . Knee pain   . Memory loss   . Paranoia ([place])   . Shoulder pain   . Venous insufficiency    Past Surgical History:  Procedure Laterality Date  . CORNEAL TRANSPLANT  2012   Dr Maudie Mercury   . RECONSTRUCTION OF EYELID     eyelid cancer 10 years ago Dr Victorino Dike   . RECONSTRUCTION OF NOSE     nose cancer/ Dr Nyoka Cowden 10 years ago     Allergies  Allergen Reactions  . Novocain [Procaine] Other (See Comments)    Unknown reaction  . Sulfamethoxazole-Trimethoprim Other (See Comments)    Unknown reaction  . Tape Other (See Comments)    Adhesive tape - unknown reaction  . Codeine Other (See Comments)    Unknown reaction  . Neosporin [Neomycin-Bacitracin Zn-Polymyx] Other (See Comments)    Unknown reaction    Allergies as of 03/04/2021      Reactions   Novocain [  procaine] Other (See Comments)   Unknown reaction   Sulfamethoxazole-trimethoprim Other (See Comments)   Unknown reaction   Tape Other (See Comments)   Adhesive tape - unknown reaction   Codeine Other (See Comments)   Unknown reaction   Neosporin [neomycin-bacitracin Zn-polymyx] Other (See Comments)   Unknown reaction      Medication List       Accurate as of March 04, 2021 11:59 PM. If you have any questions, ask your nurse or doctor.        acetaminophen 325 MG tablet Commonly known as: TYLENOL Take 650 mg by mouth in the morning, at noon, in the evening, and at bedtime.   apixaban  2.5 MG Tabs tablet Commonly known as: ELIQUIS Take 2.5 mg by mouth 2 (two) times daily.   denosumab 60 MG/ML Sosy injection Commonly known as: PROLIA Inject 60 mg into the skin every 6 (six) months. On the 22nd of reach 6th month   folic acid 1 MG tablet Commonly known as: FOLVITE Take 1 mg by mouth daily.   lactose free nutrition Liqd Take 237 mLs by mouth daily.   Lidocaine 4 % Ptch Apply topically. Apply to lower back Q 24hrs Once A Day   memantine 10 MG tablet Commonly known as: NAMENDA Take 10 mg by mouth 2 (two) times daily.   methotrexate 2.5 MG tablet Commonly known as: RHEUMATREX Take 15 mg by mouth every Friday. Caution:Chemotherapy. Protect from light.   mineral oil-hydrophilic petrolatum ointment Apply 1 application topically 2 (two) times daily as needed for dry skin. Apply to forehead, legs, and right mid back   polyethylene glycol 17 g packet Commonly known as: MIRALAX / GLYCOLAX Take 17 g by mouth every other day.   pravastatin 20 MG tablet Commonly known as: PRAVACHOL Take 20 mg by mouth at bedtime.   prednisoLONE acetate 1 % ophthalmic suspension Commonly known as: PRED FORTE Place 1 drop into the right eye daily.   QUEtiapine 25 MG tablet Commonly known as: SEROQUEL Take 12.5 mg by mouth 2 (two) times daily.   senna-docusate 8.6-50 MG tablet Commonly known as: Senokot-S Take 2 tablets by mouth at bedtime.   sertraline 50 MG tablet Commonly known as: ZOLOFT Take 50 mg by mouth daily.   sodium chloride 5 % ophthalmic solution Commonly known as: MURO 128 Place 1 drop into both eyes 4 (four) times daily.   Systane 0.4-0.3 % Soln Generic drug: Polyethyl Glycol-Propyl Glycol Place 1 drop into both eyes in the morning and at bedtime.       Review of Systems  Constitutional: Positive for unexpected weight change. Negative for activity change and fever.       Gained #6 Ibs in the past 2 months  HENT: Positive for hearing loss. Negative for  congestion, mouth sores and trouble swallowing.   Eyes: Negative for visual disturbance.  Respiratory: Negative for cough.   Cardiovascular: Positive for leg swelling.  Gastrointestinal: Negative for abdominal pain and constipation.  Genitourinary: Negative for difficulty urinating, dysuria and urgency.  Musculoskeletal: Positive for arthralgias, back pain and gait problem.  Skin: Negative for color change.  Neurological: Negative for speech difficulty, weakness and headaches.       Dementia  Psychiatric/Behavioral: Positive for confusion. Negative for sleep disturbance. The patient is not nervous/anxious.     Immunization History  Administered Date(s) Administered  . Influenza, High Dose Seasonal PF 10/02/2013, 08/25/2015, 09/03/2016, 09/05/2017, 09/12/2018, 08/25/2019  . Influenza-Unspecified 10/01/2014, 09/03/2020  . [place] Sars-Covid-2 Vaccination  11/24/2019, 12/22/2019, 09/30/2020  . Pneumococcal Conjugate-13 10/21/2014  . Pneumococcal Polysaccharide-23 01/23/2018  . Td 04/18/2013  . Zoster 09/04/2012   Pertinent  Health Maintenance Due  Topic Date Due  . DEXA SCAN  Never done  . INFLUENZA VACCINE  06/22/2021  . PNA vac Low Risk Adult  Completed   No flowsheet data found. Functional Status Survey:    Vitals:   03/04/21 1555  BP: 128/68  Pulse: 70  Resp: 18  Temp: 98.1 F (36.7 C)  SpO2: 95%  Weight: 148 lb 1.6 oz (67.2 kg)  Height: 5\' 6"  (1.676 m)   Body mass index is 23.9 kg/m. Physical Exam Vitals and nursing note reviewed.  Constitutional:      Appearance: Normal appearance.  HENT:     Head: Normocephalic and atraumatic.     Nose: Nose normal.     Mouth/Throat:     Mouth: Mucous membranes are moist.  Eyes:     Conjunctiva/sclera: Conjunctivae normal.     Pupils: Pupils are equal, round, and reactive to light.  Cardiovascular:     Rate and Rhythm: Normal rate and regular rhythm.  Pulmonary:     Effort: Pulmonary effort is normal.     Breath  sounds: No rales.  Abdominal:     General: Bowel sounds are normal.     Palpations: Abdomen is soft.     Tenderness: There is no abdominal tenderness.     Hernia: A hernia is present.     Comments: Right inguinal hernia, reducible, a tennis ball sized   Musculoskeletal:     Cervical back: Normal range of motion and neck supple.     Right lower leg: Edema present.     Left lower leg: Edema present.     Comments: Lower back pain is better. Trace edema BLE  Skin:    General: Skin is warm and dry.  Neurological:     General: No focal deficit present.     Mental Status: She is alert. Mental status is at baseline.     Gait: Gait abnormal.     Comments: Oriented to person, place.   Psychiatric:        Mood and Affect: Mood normal.        Behavior: Behavior normal.     Comments: Pleasantly confused.      Labs reviewed: Recent Labs    07/19/20 1900 07/20/20 0104 07/21/20 0342 07/31/20 0000 09/28/20 0000 11/27/20 0000  NA 139  --  140 139 140 140  K 3.9  --  4.1 4.2 4.4 5.0  CL 103  --  108 106 104 108  CO2 25  --  25 25* 23* 28*  GLUCOSE 127*  --  102*  --   --   --   BUN 17  --  23 19 14 16   CREATININE 0.99  --  1.07* 0.9 0.7 0.8  CALCIUM 10.6*  --  9.2 8.7 10.2 8.9  MG  --  2.2  --   --   --   --    Recent Labs    07/31/20 0000 09/28/20 0000 11/27/20 0000  AST 19 19 14   ALT 14 10 6*  ALKPHOS 81 70 47  ALBUMIN 3.7 4.0 3.4*   Recent Labs    07/19/20 1900 07/21/20 0342 07/31/20 0000 09/28/20 0000 11/27/20 0000 12/11/20 0000  WBC 11.5* 8.0   < > 8.8 6.1 5.8  NEUTROABS 9.4*  --    < > 5,139.00 3,032.00  2,378.00  HGB 12.5 11.2*   < > 12.6 10.8* 10.6*  HCT 39.3 34.7*   < > 39 33* 33*  MCV 94.9 97.2  --   --   --   --   PLT 176 153   < > 241 204 205   < > = values in this interval not displayed.   Lab Results  Component Value Date   TSH 2.45 04/24/2020   No results found for: HGBA1C Lab Results  Component Value Date   CHOL 141 12/08/2019   HDL 49  12/08/2019   LDLCALC 78 12/08/2019   TRIG 59 12/08/2019    Significant Diagnostic Results in last 30 days:  No results found.  Assessment/Plan Slow transit constipation takes Senna, MiraLax   Vascular dementia ([place]) Resides in SNF [facility] for supportive care, takes Memantine 10mg  bid   PAF (paroxysmal atrial fibrillation) ([place]) Heart rate is in control, takes Eliquis 2.5mg  bid   Depression with anxiety Her mood is stable, except occasionally increased confusion, but able to be redirected mostly, takes Sertraline, Quetiapine, off Buspar 09/26/20   Other specified rheumatoid arthritis, multiple sites [facility]) takes Methotrexate 15mg  wkly, Hgb 10.6 12/11/20  Osteoporosis , takes Prolia, Ca, Vit D. Due DEXA  Anemia, chronic disease Hgb 10.6, Fe 66, Vit B12 1010 06/07/95, takes Folic acid     L5 vertebral fracture (HCC) L5 sustained from fall,07/19/20, pain is controlled, uses w/c for mobility mostly, takesRobaxin 250mg  q8hr prn, Tylenol.      Family/ staff Communication: plan of care reviewed with the patient and charge nurse.   Labs/tests ordered:  none  Time spend 35 minutes.

## 2021-03-05 NOTE — Assessment & Plan Note (Signed)
takes Senna, MiraLax   

## 2021-03-05 NOTE — Assessment & Plan Note (Signed)
L5 sustained from fall,07/19/20, pain is controlled, uses w/c for mobility mostly, takesRobaxin 250mg  q8hr prn, Tylenol.

## 2021-03-05 NOTE — Assessment & Plan Note (Signed)
Hgb 10.6, Fe 66, Vit B12 1010 8/35/07, takes Folic acid

## 2021-03-05 NOTE — Assessment & Plan Note (Signed)
Her mood is stable, except occasionally increased confusion, but able to be redirected mostly, takes Sertraline, Quetiapine, off Buspar 09/26/20

## 2021-03-05 NOTE — Assessment & Plan Note (Signed)
,   takes Prolia, Ca, Vit D. Due DEXA

## 2021-03-05 NOTE — Assessment & Plan Note (Signed)
Heart rate is in control, takes Eliquis 2.5mg  bid

## 2021-03-05 NOTE — Assessment & Plan Note (Signed)
takes Methotrexate 15mg  wkly, Hgb 10.6 12/11/20

## 2021-03-05 NOTE — Assessment & Plan Note (Signed)
Resides in SNF [facility] for supportive care, takes Memantine 10mg  bid

## 2021-03-09 ENCOUNTER — Encounter: Payer: Self-pay | Admitting: Nurse Practitioner

## 2021-04-07 ENCOUNTER — Non-Acute Institutional Stay (SKILLED_NURSING_FACILITY): Payer: Medicare Other | Admitting: Internal Medicine

## 2021-04-07 ENCOUNTER — Encounter: Payer: Self-pay | Admitting: Internal Medicine

## 2021-04-07 DIAGNOSIS — D638 Anemia in other chronic diseases classified elsewhere: Secondary | ICD-10-CM

## 2021-04-07 DIAGNOSIS — F0151 Vascular dementia with behavioral disturbance: Secondary | ICD-10-CM

## 2021-04-07 DIAGNOSIS — F418 Other specified anxiety disorders: Secondary | ICD-10-CM

## 2021-04-07 DIAGNOSIS — M069 Rheumatoid arthritis, unspecified: Secondary | ICD-10-CM

## 2021-04-07 DIAGNOSIS — I480 Paroxysmal atrial fibrillation: Secondary | ICD-10-CM | POA: Diagnosis not present

## 2021-04-07 DIAGNOSIS — M810 Age-related osteoporosis without current pathological fracture: Secondary | ICD-10-CM

## 2021-04-07 DIAGNOSIS — F01518 Vascular dementia, unspecified severity, with other behavioral disturbance: Secondary | ICD-10-CM

## 2021-04-07 DIAGNOSIS — E785 Hyperlipidemia, unspecified: Secondary | ICD-10-CM

## 2021-04-07 NOTE — Progress Notes (Signed)
Location:   [facility] Room Number: 50 Place of Service:  SNF [phone]) Provider:  Veleta Miners MD  Virgie Dad, MD  Patient Care Team: Virgie Dad, MD as PCP - General (Internal Medicine) Pichardo-Geisinger, Mila Palmer, MD as Referring Physician Nestor Ramp Effie Shy, MD as Referring Physician (Ophthalmology)  Extended Emergency Contact Information Primary Emergency Contact: Key, Tim Address: [address]          [place], [place] [address] [name] of [place] Phone: [phone] Relation: [name] Secondary Emergency Contact: Owens Shark Address: [address]          [place], [place] [address] [name] of [place] Phone: [phone] Mobile Phone: [phone] Relation: Other  Code Status:  Full Code Goals of care: Advanced Directive information Advanced Directives 04/07/2021  Does Patient Have a Medical Advance Directive? No  Type of Paramedic of [place];Living will;Out of facility DNR (pink MOST or yellow form)  Does patient want to make changes to medical advance directive? No - Patient declined  Copy of [place] in Chart? Yes - validated most recent copy scanned in chart (See row information)  Would patient like information on creating a medical advance directive? -  Pre-existing out of facility DNR order (yellow form or pink MOST form) -     Chief Complaint  Patient presents with  . Medical Management of Chronic Issues  . Health Maintenance    Dexa scan     HPI:  Pt is a 87 y.o. female seen today for medical management of chronic diseases.    Patient has a history ofPAF on Eliquis, GERD, Rheumatoid arthritis, Hyperlipidemia, Osteoporosis and Dementiawith Anxietyand Behavior issues Was admitted in the hospital from 8/28-9/2 for nondisplaced fracture of L5 with after sustaining a mechanical fall  Doing well. No New issues Has gained 20 lbs in past few months.  Mostly due to improve Appetite Does have some behavior issues Will go and Bother other Patient s But easily manageable   Past Medical History:  Diagnosis Date  . Acid reflux disease   . Arthritis    rheumatoid  . Atrial fibrillation ([place])   . Back pain   . Bursitis of hip, right   . Cancer ([place])    skin  . Chest pain   . Confusion   . Constipation   . Fuchs' corneal dystrophy   . GERD (gastroesophageal reflux disease)   . Hard of hearing   . Knee pain   . Memory loss   . Paranoia ([place])   . Shoulder pain   . Venous insufficiency    Past Surgical History:  Procedure Laterality Date  . CORNEAL TRANSPLANT  2012   Dr Maudie Mercury   . RECONSTRUCTION OF EYELID     eyelid cancer 10 years ago Dr Victorino Dike   . RECONSTRUCTION OF NOSE     nose cancer/ Dr Nyoka Cowden 10 years ago     Allergies  Allergen Reactions  . Novocain [Procaine] Other (See Comments)    Unknown reaction  . Sulfamethoxazole-Trimethoprim Other (See Comments)    Unknown reaction  . Tape Other (See Comments)    Adhesive tape - unknown reaction  . Codeine Other (See Comments)    Unknown reaction  . Neosporin [Neomycin-Bacitracin Zn-Polymyx] Other (See Comments)    Unknown reaction    Allergies as of 04/07/2021      Reactions   Novocain [procaine] Other (See Comments)   Unknown reaction   Sulfamethoxazole-trimethoprim  Other (See Comments)   Unknown reaction   Tape Other (See Comments)   Adhesive tape - unknown reaction   Codeine Other (See Comments)   Unknown reaction   Neosporin [neomycin-bacitracin Zn-polymyx] Other (See Comments)   Unknown reaction      Medication List       Accurate as of Apr 07, 2021 11:36 AM. If you have any questions, ask your nurse or doctor.        acetaminophen 325 MG tablet Commonly known as: TYLENOL Take 650 mg by mouth in the morning, at noon, in the evening, and at bedtime.   apixaban 2.5 MG Tabs tablet Commonly known as: ELIQUIS Take 2.5 mg by mouth 2 (two) times  daily.   denosumab 60 MG/ML Sosy injection Commonly known as: PROLIA Inject 60 mg into the skin every 6 (six) months. On the 22nd of reach 6th month   folic acid 1 MG tablet Commonly known as: FOLVITE Take 1 mg by mouth daily.   lactose free nutrition Liqd Take 237 mLs by mouth daily.   Lidocaine 4 % Ptch Apply topically. Apply to lower back Q 24hrs Once A Day   memantine 10 MG tablet Commonly known as: NAMENDA Take 10 mg by mouth 2 (two) times daily.   methotrexate 2.5 MG tablet Commonly known as: RHEUMATREX Take 15 mg by mouth every Friday. Caution:Chemotherapy. Protect from light.   mineral oil-hydrophilic petrolatum ointment Apply 1 application topically 2 (two) times daily as needed for dry skin. Apply to forehead, legs, and right mid back   polyethylene glycol 17 g packet Commonly known as: MIRALAX / GLYCOLAX Take 17 g by mouth every other day.   pravastatin 20 MG tablet Commonly known as: PRAVACHOL Take 20 mg by mouth at bedtime.   prednisoLONE acetate 1 % ophthalmic suspension Commonly known as: PRED FORTE Place 1 drop into the right eye daily.   QUEtiapine 25 MG tablet Commonly known as: SEROQUEL Take 12.5 mg by mouth 2 (two) times daily.   senna-docusate 8.6-50 MG tablet Commonly known as: Senokot-S Take 2 tablets by mouth at bedtime.   sertraline 50 MG tablet Commonly known as: ZOLOFT Take 50 mg by mouth daily.   sodium chloride 5 % ophthalmic solution Commonly known as: MURO 128 Place 1 drop into both eyes 4 (four) times daily.   Systane 0.4-0.3 % Soln Generic drug: Polyethyl Glycol-Propyl Glycol Place 1 drop into both eyes in the morning and at bedtime.       Review of Systems  Unable to perform ROS: Dementia    Immunization History  Administered Date(s) Administered  . Influenza, High Dose Seasonal PF 10/02/2013, 08/25/2015, 09/03/2016, 09/05/2017, 09/12/2018, 08/25/2019  . Influenza-Unspecified 10/01/2014, 09/03/2020  . Moderna  Sars-Covid-2 Vaccination 11/24/2019, 12/22/2019, 09/30/2020  . Pneumococcal Conjugate-13 10/21/2014  . Pneumococcal Polysaccharide-23 01/23/2018  . Td 04/18/2013  . Zoster 09/04/2012   Pertinent  Health Maintenance Due  Topic Date Due  . DEXA SCAN  Never done  . INFLUENZA VACCINE  06/22/2021  . PNA vac Low Risk Adult  Completed   No flowsheet data found. Functional Status Survey:    Vitals:   04/07/21 1119  BP: (!) 110/54  Pulse: 74  Resp: 18  Temp: 98.1 F (36.7 C)  SpO2: 94%  Weight: 151 lb 11.2 oz (68.8 kg)  Height: 5\' 6"  (1.676 m)   Body mass index is 24.49 kg/m. Physical Exam Vitals reviewed.  Constitutional:      Appearance: Normal appearance.  HENT:  Head: Normocephalic.     Nose: Nose normal.     Mouth/Throat:     Mouth: Mucous membranes are moist.     Pharynx: Oropharynx is clear.  Eyes:     Pupils: Pupils are equal, round, and reactive to light.  Cardiovascular:     Rate and Rhythm: Normal rate. Rhythm irregular.     Pulses: Normal pulses.     Heart sounds: Normal heart sounds.  Pulmonary:     Effort: Pulmonary effort is normal.     Breath sounds: Normal breath sounds.  Abdominal:     General: Abdomen is flat. Bowel sounds are normal.     Palpations: Abdomen is soft.  Musculoskeletal:     Cervical back: Neck supple.     Comments: Mild edema  Skin:    General: Skin is warm.  Neurological:     General: No focal deficit present.     Mental Status: She is alert.     Comments: Alert. Stays Mostly in her Wheelchair  Psychiatric:        Mood and Affect: Mood normal.        Thought Content: Thought content normal.     Labs reviewed: Recent Labs    07/19/20 1900 07/20/20 0104 07/21/20 0342 07/31/20 0000 09/28/20 0000 11/27/20 0000  NA 139  --  140 139 140 140  K 3.9  --  4.1 4.2 4.4 5.0  CL 103  --  108 106 104 108  CO2 25  --  25 25* 23* 28*  GLUCOSE 127*  --  102*  --   --   --   BUN 17  --  23 19 14 16   CREATININE 0.99  --  1.07*  0.9 0.7 0.8  CALCIUM 10.6*  --  9.2 8.7 10.2 8.9  MG  --  2.2  --   --   --   --    Recent Labs    07/31/20 0000 09/28/20 0000 11/27/20 0000  AST 19 19 14   ALT 14 10 6*  ALKPHOS 81 70 47  ALBUMIN 3.7 4.0 3.4*   Recent Labs    07/19/20 1900 07/21/20 0342 07/31/20 0000 09/28/20 0000 11/27/20 0000 12/11/20 0000  WBC 11.5* 8.0   < > 8.8 6.1 5.8  NEUTROABS 9.4*  --    < > 5,139.00 3,032.00 2,378.00  HGB 12.5 11.2*   < > 12.6 10.8* 10.6*  HCT 39.3 34.7*   < > 39 33* 33*  MCV 94.9 97.2  --   --   --   --   PLT 176 153   < > 241 204 205   < > = values in this interval not displayed.   Lab Results  Component Value Date   TSH 2.45 04/24/2020   No results found for: HGBA1C Lab Results  Component Value Date   CHOL 156 01/08/2021   HDL 34 (A) 01/08/2021   LDLCALC 90 01/08/2021   TRIG 220 (A) 01/08/2021    Significant Diagnostic Results in last 30 days:  No results found.  Assessment/Plan PAF (paroxysmal atrial fibrillation) (HCC) Continue on Eliquis  Vascular dementia with behavior disturbance (HCC) On Low dose of Seroquel and Namenda Has failed GDR Osteoporosis without current pathological fracture, unspecified osteoporosis type On Prolia  Anemia, chronic disease Hgb low but stable Depression with anxiety Continue Zoloft Rheumatoid arthritis, involving unspecified site, unspecified whether rheumatoid factor present (HCC) ON Methotrexate and Folic acid Symptoms Controlled Hyperlipidemia, unspecified hyperlipidemia type Continue Statin  Closed stable burst fracture of fifth lumbar vertebra On Tylenol    Family/ staff Communication:   Labs/tests ordered:

## 2021-04-10 ENCOUNTER — Encounter: Payer: Self-pay | Admitting: Nurse Practitioner

## 2021-04-10 ENCOUNTER — Non-Acute Institutional Stay (SKILLED_NURSING_FACILITY): Payer: Medicare Other | Admitting: Nurse Practitioner

## 2021-04-10 DIAGNOSIS — F418 Other specified anxiety disorders: Secondary | ICD-10-CM

## 2021-04-10 DIAGNOSIS — R296 Repeated falls: Secondary | ICD-10-CM | POA: Diagnosis not present

## 2021-04-10 DIAGNOSIS — R269 Unspecified abnormalities of gait and mobility: Secondary | ICD-10-CM | POA: Diagnosis not present

## 2021-04-10 DIAGNOSIS — D638 Anemia in other chronic diseases classified elsewhere: Secondary | ICD-10-CM

## 2021-04-10 DIAGNOSIS — I480 Paroxysmal atrial fibrillation: Secondary | ICD-10-CM | POA: Diagnosis not present

## 2021-04-10 DIAGNOSIS — E785 Hyperlipidemia, unspecified: Secondary | ICD-10-CM

## 2021-04-10 DIAGNOSIS — F0151 Vascular dementia with behavioral disturbance: Secondary | ICD-10-CM

## 2021-04-10 DIAGNOSIS — S32050D Wedge compression fracture of fifth lumbar vertebra, subsequent encounter for fracture with routine healing: Secondary | ICD-10-CM

## 2021-04-10 DIAGNOSIS — F01518 Vascular dementia, unspecified severity, with other behavioral disturbance: Secondary | ICD-10-CM

## 2021-04-10 DIAGNOSIS — M0689 Other specified rheumatoid arthritis, multiple sites: Secondary | ICD-10-CM

## 2021-04-10 DIAGNOSIS — M810 Age-related osteoporosis without current pathological fracture: Secondary | ICD-10-CM

## 2021-04-10 DIAGNOSIS — K5901 Slow transit constipation: Secondary | ICD-10-CM

## 2021-04-10 NOTE — Progress Notes (Signed)
Location:   SNF [facility] Room Number: 8 Place of Service:  SNF (31) Provider: [facility] Kaetlin Bullen NP  Virgie Dad, MD  Patient Care Team: Virgie Dad, MD as PCP - General (Internal Medicine) Pichardo-Geisinger, Mila Palmer, MD as Referring Physician Nestor Ramp, Effie Shy, MD as Referring Physician (Ophthalmology)  Extended Emergency Contact Information Primary Emergency Contact: Key, Tim Address: [address]          [place], [place] [address] [name] of [place] Phone: [phone] Relation: Nephew Secondary Emergency Contact: Owens Shark Address: [address]          [place], [place] [address] [name] of [place] Phone: [phone] Mobile Phone: [phone] Relation: Other  Code Status: DNR Goals of care: Advanced Directive information Advanced Directives 04/07/2021  Does Patient Have a Medical Advance Directive? No  Type of Paramedic of [place];Living will;Out of facility DNR (pink MOST or yellow form)  Does patient want to make changes to medical advance directive? No - Patient declined  Copy of [place] in Chart? Yes - validated most recent copy scanned in chart (See row information)  Would patient like information on creating a medical advance directive? -  Pre-existing out of facility DNR order (yellow form or pink MOST form) -     Chief Complaint  Patient presents with  . Acute Visit    Fall 04/08/21    HPI:  Pt is a 87 y.o. female seen today for an acute visit for fall 04/08/21 when the patient was found sitting on floor, resulted s small skin tear left lower leg, closed with steri strips.   Gait abnormality, uses w/c for mobility  L5 sustained from fall,07/19/20,takesRobaxin 250mg  q8hr prn, Tylenol.  Constipation, takes Senna, MiraLax  Dementia, takes Memantine 10mg  bid  Afib, takes Eliquis 2.5mg  bid  Depression/anxiety,  takes Sertraline, Quetiapine, off Buspar 09/26/20  RA, takes Methotrexate 15mg  wkly, Hgb 10.6 12/11/20, also takes Tylenol qid.  OP, takes Prolia, Ca, Vit D  Anemia, Hgb 10.6, Fe 66, Vit B12 1010 2/77/82, takes Folic acid  Hyperlipidemia, takes Pravastatin, LDL 90 01/08/21    Past Medical History:  Diagnosis Date  . Acid reflux disease   . Arthritis    rheumatoid  . Atrial fibrillation ([place])   . Back pain   . Bursitis of hip, right   . Cancer ([place])    skin  . Chest pain   . Confusion   . Constipation   . Fuchs' corneal dystrophy   . GERD (gastroesophageal reflux disease)   . Hard of hearing   . Knee pain   . Memory loss   . Paranoia ([place])   . Shoulder pain   . Venous insufficiency    Past Surgical History:  Procedure Laterality Date  . CORNEAL TRANSPLANT  2012   Dr Maudie Mercury   . RECONSTRUCTION OF EYELID     eyelid cancer 10 years ago Dr Victorino Dike   . RECONSTRUCTION OF NOSE     nose cancer/ Dr Nyoka Cowden 10 years ago     Allergies  Allergen Reactions  . Novocain [Procaine] Other (See Comments)    Unknown reaction  . Sulfamethoxazole-Trimethoprim Other (See Comments)    Unknown reaction  . Tape Other (See Comments)    Adhesive tape - unknown reaction  . Codeine Other (See Comments)    Unknown reaction  . Neosporin [Neomycin-Bacitracin Zn-Polymyx] Other (See Comments)    Unknown reaction    Allergies as of 04/10/2021  Reactions   Novocain [procaine] Other (See Comments)   Unknown reaction   Sulfamethoxazole-trimethoprim Other (See Comments)   Unknown reaction   Tape Other (See Comments)   Adhesive tape - unknown reaction   Codeine Other (See Comments)   Unknown reaction   Neosporin [neomycin-bacitracin Zn-polymyx] Other (See Comments)   Unknown reaction      Medication List       Accurate as of Apr 10, 2021 11:59 PM. If you have any questions, ask your nurse or doctor.        acetaminophen 325 MG  tablet Commonly known as: TYLENOL Take 650 mg by mouth in the morning, at noon, in the evening, and at bedtime.   apixaban 2.5 MG Tabs tablet Commonly known as: ELIQUIS Take 2.5 mg by mouth 2 (two) times daily.   denosumab 60 MG/ML Sosy injection Commonly known as: PROLIA Inject 60 mg into the skin every 6 (six) months. On the 22nd of reach 6th month   folic acid 1 MG tablet Commonly known as: FOLVITE Take 1 mg by mouth daily.   lactose free nutrition Liqd Take 237 mLs by mouth daily.   Lidocaine 4 % Ptch Apply topically. Apply to lower back Q 24hrs Once A Day   memantine 10 MG tablet Commonly known as: NAMENDA Take 10 mg by mouth 2 (two) times daily.   methotrexate 2.5 MG tablet Commonly known as: RHEUMATREX Take 15 mg by mouth every Friday. Caution:Chemotherapy. Protect from light.   mineral oil-hydrophilic petrolatum ointment Apply 1 application topically 2 (two) times daily as needed for dry skin. Apply to forehead, legs, and right mid back   polyethylene glycol 17 g packet Commonly known as: MIRALAX / GLYCOLAX Take 17 g by mouth every other day.   pravastatin 20 MG tablet Commonly known as: PRAVACHOL Take 20 mg by mouth at bedtime.   prednisoLONE acetate 1 % ophthalmic suspension Commonly known as: PRED FORTE Place 1 drop into the right eye daily.   QUEtiapine 25 MG tablet Commonly known as: SEROQUEL Take 12.5 mg by mouth 2 (two) times daily.   senna-docusate 8.6-50 MG tablet Commonly known as: Senokot-S Take 2 tablets by mouth at bedtime.   sertraline 50 MG tablet Commonly known as: ZOLOFT Take 50 mg by mouth daily.   sodium chloride 5 % ophthalmic solution Commonly known as: MURO 128 Place 1 drop into both eyes 4 (four) times daily.   Systane 0.4-0.3 % Soln Generic drug: Polyethyl Glycol-Propyl Glycol Place 1 drop into both eyes in the morning and at bedtime.       Review of Systems  Constitutional: Negative for activity change, appetite  change and fever.  HENT: Positive for hearing loss. Negative for congestion, mouth sores and trouble swallowing.   Eyes: Negative for visual disturbance.  Respiratory: Negative for cough.   Cardiovascular: Positive for leg swelling.  Gastrointestinal: Negative for abdominal pain and constipation.  Genitourinary: Negative for difficulty urinating, dysuria and urgency.  Musculoskeletal: Positive for arthralgias, back pain and gait problem.  Skin: Negative for color change.       A small skin tear lateral left lower leg, no s/s of infection.   Neurological: Negative for speech difficulty, weakness and headaches.       Dementia  Psychiatric/Behavioral: Positive for confusion. Negative for sleep disturbance. The patient is not nervous/anxious.     Immunization History  Administered Date(s) Administered  . Influenza, High Dose Seasonal PF 10/02/2013, 08/25/2015, 09/03/2016, 09/05/2017, 09/12/2018, 08/25/2019  . Influenza-Unspecified 10/01/2014,  09/03/2020  . Moderna Sars-Covid-2 Vaccination 11/24/2019, 12/22/2019, 09/30/2020  . Pneumococcal Conjugate-13 10/21/2014  . Pneumococcal Polysaccharide-23 01/23/2018  . Td 04/18/2013  . Zoster 09/04/2012   Pertinent  Health Maintenance Due  Topic Date Due  . DEXA SCAN  Never done  . INFLUENZA VACCINE  06/22/2021  . PNA vac Low Risk Adult  Completed   No flowsheet data found. Functional Status Survey:    Vitals:   04/10/21 1251  BP: 112/72  Pulse: 66  Resp: 20  Temp: 97.7 F (36.5 C)  SpO2: 95%   There is no height or weight on file to calculate BMI. Physical Exam Vitals and nursing note reviewed.  Constitutional:      Appearance: Normal appearance.  HENT:     Head: Normocephalic and atraumatic.     Nose: Nose normal.     Mouth/Throat:     Mouth: Mucous membranes are moist.  Eyes:     Conjunctiva/sclera: Conjunctivae normal.     Pupils: Pupils are equal, round, and reactive to light.  Cardiovascular:     Rate and Rhythm:  Normal rate and regular rhythm.  Pulmonary:     Effort: Pulmonary effort is normal.     Breath sounds: No rales.  Abdominal:     General: Bowel sounds are normal.     Palpations: Abdomen is soft.     Tenderness: There is no abdominal tenderness.     Hernia: A hernia is present.     Comments: Right inguinal hernia, reducible, a tennis ball sized   Musculoskeletal:     Cervical back: Normal range of motion and neck supple.     Right lower leg: Edema present.     Left lower leg: Edema present.     Comments: Lower back pain is better. Trace edema BLE  Skin:    General: Skin is warm and dry.     Comments: A small skin tear lateral left lower leg, no s/s of infection.   Neurological:     General: No focal deficit present.     Mental Status: She is alert. Mental status is at baseline.     Gait: Gait abnormal.     Comments: Oriented to person, place.   Psychiatric:        Mood and Affect: Mood normal.        Behavior: Behavior normal.     Comments: Pleasantly confused.      Labs reviewed: Recent Labs    07/19/20 1900 07/20/20 0104 07/21/20 0342 07/31/20 0000 09/28/20 0000 11/27/20 0000  NA 139  --  140 139 140 140  K 3.9  --  4.1 4.2 4.4 5.0  CL 103  --  108 106 104 108  CO2 25  --  25 25* 23* 28*  GLUCOSE 127*  --  102*  --   --   --   BUN 17  --  23 19 14 16   CREATININE 0.99  --  1.07* 0.9 0.7 0.8  CALCIUM 10.6*  --  9.2 8.7 10.2 8.9  MG  --  2.2  --   --   --   --    Recent Labs    07/31/20 0000 09/28/20 0000 11/27/20 0000  AST 19 19 14   ALT 14 10 6*  ALKPHOS 81 70 47  ALBUMIN 3.7 4.0 3.4*   Recent Labs    07/19/20 1900 07/21/20 0342 07/31/20 0000 09/28/20 0000 11/27/20 0000 12/11/20 0000  WBC 11.5* 8.0   < >  8.8 6.1 5.8  NEUTROABS 9.4*  --    < > 5,139.00 3,032.00 2,378.00  HGB 12.5 11.2*   < > 12.6 10.8* 10.6*  HCT 39.3 34.7*   < > 39 33* 33*  MCV 94.9 97.2  --   --   --   --   PLT 176 153   < > 241 204 205   < > = values in this interval not  displayed.   Lab Results  Component Value Date   TSH 2.45 04/24/2020   No results found for: HGBA1C Lab Results  Component Value Date   CHOL 156 01/08/2021   HDL 34 (A) 01/08/2021   LDLCALC 90 01/08/2021   TRIG 220 (A) 01/08/2021    Significant Diagnostic Results in last 30 days:  No results found.  Assessment/Plan: Frequent falls  fall 04/08/21 when the patient was found sitting on floor, resulted s small skin tear left lower leg, closed with steri strips. The patient needs assistance for transfer, but she doesn't always remember to call or wait for assistance. She falls when she attempts transferring self. Close supervision/assistance for safety.   Gait abnormality uses w/c for mobility   Vascular dementia ([place]) takes Memantine 10mg  bid, limited safety awareness.   PAF (paroxysmal atrial fibrillation) (HCC) Heart rate is in control, , takes Eliquis 2.5mg  bid   L5 vertebral fracture (HCC) L5 sustained from fall,07/19/20,takesRobaxin 250mg  q8hr prn, Tylenol.   Slow transit constipation takes Senna, MiraLax   Depression with anxiety Her mood is stable, occasionally irritable,  takes Sertraline, Quetiapine, off Buspar 09/26/20   Other specified rheumatoid arthritis, multiple sites ([place]) takes Methotrexate 15mg  wkly, Hgb 10.6 12/11/20, also takes Tylenol qid.   Osteoporosis takes Prolia, Ca, Vit D    Anemia, chronic disease Stable, Hgb 10.6, Fe 66, Vit B12 1010 [id number], takes Folic acid   Hyperlipidemia  takes Pravastatin, LDL 90 01/08/21    Family/ staff Communication: plan of care reviewed with the patient and charge nurse.   Labs/tests ordered:  none  Time spend 35 minutes.

## 2021-04-10 NOTE — Assessment & Plan Note (Signed)
L5 sustained from fall,07/19/20,takesRobaxin 250mg q8hr prn, Tylenol.  

## 2021-04-10 NOTE — Assessment & Plan Note (Signed)
takes Senna, MiraLax   

## 2021-04-10 NOTE — Assessment & Plan Note (Signed)
Her mood is stable, occasionally irritable,  takes Sertraline, Quetiapine, off Buspar 09/26/20

## 2021-04-10 NOTE — Assessment & Plan Note (Signed)
uses w/c for mobility ?

## 2021-04-10 NOTE — Assessment & Plan Note (Signed)
Heart rate is in control, , takes Eliquis 2.5mg  bid

## 2021-04-10 NOTE — Assessment & Plan Note (Signed)
takes Pravastatin, LDL 90 01/08/21

## 2021-04-10 NOTE — Assessment & Plan Note (Signed)
takes Memantine 10mg  bid, limited safety awareness.

## 2021-04-10 NOTE — Assessment & Plan Note (Signed)
fall 04/08/21 when the patient was found sitting on floor, resulted s small skin tear left lower leg, closed with steri strips. The patient needs assistance for transfer, but she doesn't always remember to call or wait for assistance. She falls when she attempts transferring self. Close supervision/assistance for safety.

## 2021-04-10 NOTE — Assessment & Plan Note (Signed)
Stable, Hgb 10.6, Fe 66, Vit B12 1010 12/03/71, takes Folic acid

## 2021-04-10 NOTE — Assessment & Plan Note (Signed)
takes Methotrexate 15mg  wkly, Hgb 10.6 12/11/20, also takes Tylenol qid.

## 2021-04-10 NOTE — Assessment & Plan Note (Signed)
takes Prolia, Ca, Vit D 

## 2021-05-11 ENCOUNTER — Non-Acute Institutional Stay (SKILLED_NURSING_FACILITY): Payer: Medicare Other | Admitting: Nurse Practitioner

## 2021-05-11 ENCOUNTER — Encounter: Payer: Self-pay | Admitting: Nurse Practitioner

## 2021-05-11 DIAGNOSIS — F418 Other specified anxiety disorders: Secondary | ICD-10-CM

## 2021-05-11 DIAGNOSIS — I480 Paroxysmal atrial fibrillation: Secondary | ICD-10-CM

## 2021-05-11 DIAGNOSIS — K5901 Slow transit constipation: Secondary | ICD-10-CM

## 2021-05-11 DIAGNOSIS — D638 Anemia in other chronic diseases classified elsewhere: Secondary | ICD-10-CM | POA: Diagnosis not present

## 2021-05-11 DIAGNOSIS — E785 Hyperlipidemia, unspecified: Secondary | ICD-10-CM

## 2021-05-11 DIAGNOSIS — F0151 Vascular dementia with behavioral disturbance: Secondary | ICD-10-CM

## 2021-05-11 DIAGNOSIS — M810 Age-related osteoporosis without current pathological fracture: Secondary | ICD-10-CM

## 2021-05-11 DIAGNOSIS — M069 Rheumatoid arthritis, unspecified: Secondary | ICD-10-CM | POA: Diagnosis not present

## 2021-05-11 DIAGNOSIS — R269 Unspecified abnormalities of gait and mobility: Secondary | ICD-10-CM

## 2021-05-11 DIAGNOSIS — F01518 Vascular dementia, unspecified severity, with other behavioral disturbance: Secondary | ICD-10-CM

## 2021-05-11 NOTE — Assessment & Plan Note (Signed)
takes Prolia, Ca, Vit D 

## 2021-05-11 NOTE — Assessment & Plan Note (Signed)
takes Sertraline, Quetiapine,  off Buspar 09/26/20

## 2021-05-11 NOTE — Assessment & Plan Note (Signed)
takes Senna, MiraLax   

## 2021-05-11 NOTE — Progress Notes (Signed)
Location:   [facility] Room Number: [place] of Service:  SNF (31) Provider:  Aldine Grainger Otho Darner, NP    Patient Care Team: Virgie Dad, MD as PCP - General (Internal Medicine) Pichardo-Geisinger, Mila Palmer, MD as Referring Physician Nestor Ramp, Effie Shy, MD as Referring Physician (Ophthalmology)  Extended Emergency Contact Information Primary Emergency Contact: Key, Tim Address: [address]          [place], [place] [address] [name] of [place] Phone: [phone] Relation: Nephew Secondary Emergency Contact: Owens Shark Address: [address]          [place],  [address] [name] of [place] Phone: [phone] Mobile Phone: [phone] Relation: Other  Code Status:  DNR Goals of care: Advanced Directive information Advanced Directives 05/11/2021  Does Patient Have a Medical Advance Directive? No  Type of Advance Directive -  Does patient want to make changes to medical advance directive? No - Patient declined  Copy of [place] in Chart? -  Would patient like information on creating a medical advance directive? -  Pre-existing out of facility DNR order (yellow form or pink MOST form) -     Chief Complaint  Patient presents with   Medical Management of Chronic Issues    Routine follow up.    Health Maintenance    Discuss need for shingles vaccine and DEXA scan.     HPI:  Pt is a 87 y.o. female seen today for medical management of chronic diseases.    Gait abnormality, uses w/c for mobility             L5 sustained from fall, 07/19/20, takes Robaxin 250mg  q8hr prn, Tylenol.               Constipation, takes Senna, MiraLax               Dementia,  takes Memantine 10mg  bid                 Afib, takes Eliquis 2.5mg  bid              Depression/anxiety, takes Sertraline, Quetiapine,  off Buspar 09/26/20              RA, takes Methotrexate 15mg  wkly, Hgb 10.6 12/11/20, also takes Tylenol qid.               OP, takes Prolia, Ca, Vit D                  Anemia, Hgb 10.6, Fe 66, Vit B12 1010 12/13/46, takes Folic acid             Hyperlipidemia, takes Pravastatin, LDL 90 01/08/21              Past Medical History:  Diagnosis Date   Acid reflux disease    Arthritis    rheumatoid   Atrial fibrillation (HCC)    Back pain    Bursitis of hip, right    Cancer (HCC)    skin   Chest pain    Confusion    Constipation    Fuchs' corneal dystrophy    GERD (gastroesophageal reflux disease)    Hard of hearing    Knee pain    Memory loss    Paranoia (HCC)    Shoulder pain    Venous insufficiency    Past Surgical History:  Procedure Laterality Date   CORNEAL TRANSPLANT  2012   Dr Maudie Mercury  RECONSTRUCTION OF EYELID     eyelid cancer 10 years ago Dr Victorino Dike    RECONSTRUCTION OF NOSE     nose cancer/ Dr Nyoka Cowden 10 years ago     Allergies  Allergen Reactions   Novocain [Procaine] Other (See Comments)    Unknown reaction   Sulfamethoxazole-Trimethoprim Other (See Comments)    Unknown reaction   Tape Other (See Comments)    Adhesive tape - unknown reaction   Codeine Other (See Comments)    Unknown reaction   Neosporin [Neomycin-Bacitracin Zn-Polymyx] Other (See Comments)    Unknown reaction    Allergies as of 05/11/2021       Reactions   Novocain [procaine] Other (See Comments)   Unknown reaction   Sulfamethoxazole-trimethoprim Other (See Comments)   Unknown reaction   Tape Other (See Comments)   Adhesive tape - unknown reaction   Codeine Other (See Comments)   Unknown reaction   Neosporin [neomycin-bacitracin Zn-polymyx] Other (See Comments)   Unknown reaction        Medication List        Accurate as of May 11, 2021  3:21 PM. If you have any questions, ask your nurse or doctor.          STOP taking these medications    sodium chloride 5 % ophthalmic solution Commonly known as: MURO 128 Stopped by: Elim Peale X Lamar Naef, NP       TAKE these medications     acetaminophen 325 MG tablet Commonly known as: TYLENOL Take 650 mg by mouth in the morning, at noon, in the evening, and at bedtime.   apixaban 2.5 MG Tabs tablet Commonly known as: ELIQUIS Take 2.5 mg by mouth 2 (two) times daily.   denosumab 60 MG/ML Sosy injection Commonly known as: PROLIA Inject 60 mg into the skin every 6 (six) months. On the 22nd of reach 6th month   folic acid 1 MG tablet Commonly known as: FOLVITE Take 1 mg by mouth daily.   lactose free nutrition Liqd Take 237 mLs by mouth daily.   Lidocaine 4 % Ptch Apply topically. Apply to lower back Q 24hrs Once A Day   memantine 10 MG tablet Commonly known as: NAMENDA Take 10 mg by mouth 2 (two) times daily.   methotrexate 2.5 MG tablet Commonly known as: RHEUMATREX Take 15 mg by mouth every Friday. Caution:Chemotherapy. Protect from light.   mineral oil-hydrophilic petrolatum ointment Apply 1 application topically 2 (two) times daily as needed for dry skin. Apply to forehead, legs, and right mid back   polyethylene glycol 17 g packet Commonly known as: MIRALAX / GLYCOLAX Take 17 g by mouth every other day.   pravastatin 20 MG tablet Commonly known as: PRAVACHOL Take 20 mg by mouth at bedtime.   prednisoLONE acetate 1 % ophthalmic suspension Commonly known as: PRED FORTE Place 1 drop into the right eye daily.   QUEtiapine 25 MG tablet Commonly known as: SEROQUEL Take 12.5 mg by mouth 2 (two) times daily.   senna-docusate 8.6-50 MG tablet Commonly known as: Senokot-S Take 2 tablets by mouth at bedtime.   sertraline 50 MG tablet Commonly known as: ZOLOFT Take 50 mg by mouth daily.   Systane 0.4-0.3 % Soln Generic drug: Polyethyl Glycol-Propyl Glycol Place 1 drop into both eyes in the morning and at bedtime.        Review of Systems  Constitutional:  Negative for fatigue, fever and unexpected weight change.  HENT:  Positive for hearing loss. Negative for  congestion, mouth sores and  trouble swallowing.   Eyes:  Negative for visual disturbance.  Respiratory:  Negative for cough.   Cardiovascular:  Negative for leg swelling.  Gastrointestinal:  Negative for abdominal pain and constipation.  Genitourinary:  Negative for dysuria and urgency.  Musculoskeletal:  Positive for arthralgias, back pain and gait problem.  Skin:  Negative for color change.       A small skin tear lateral left lower leg, no s/s of infection.   Neurological:  Negative for speech difficulty, weakness and headaches.       Dementia  Psychiatric/Behavioral:  Positive for confusion. Negative for sleep disturbance. The patient is not nervous/anxious.    Immunization History  Administered Date(s) Administered   Influenza, High Dose Seasonal PF 10/02/2013, 08/25/2015, 09/03/2016, 09/05/2017, 09/12/2018, 08/25/2019   Influenza-Unspecified 10/01/2014, 09/03/2020   Moderna SARS-COV2 Booster Vaccination 04/21/2021   Moderna Sars-Covid-2 Vaccination 11/24/2019, 12/22/2019, 09/30/2020   Pneumococcal Conjugate-13 10/21/2014   Pneumococcal Polysaccharide-23 01/23/2018   Td 04/18/2013   Zoster, Live 09/04/2012   Pertinent  Health Maintenance Due  Topic Date Due   DEXA SCAN  Never done   INFLUENZA VACCINE  06/22/2021   PNA vac Low Risk Adult  Completed   No flowsheet data found. Functional Status Survey:    Vitals:   05/11/21 1322  BP: (!) 113/54  Pulse: 69  Resp: 18  Temp: 97.8 F (36.6 C)  SpO2: 96%  Weight: 153 lb 9.6 oz (69.7 kg)  Height: 5\' 6"  (1.676 m)   Body mass index is 24.79 kg/m. Physical Exam Vitals and nursing note reviewed.  Constitutional:      Appearance: Normal appearance.  HENT:     Head: Normocephalic and atraumatic.     Mouth/Throat:     Mouth: Mucous membranes are moist.  Eyes:     Conjunctiva/sclera: Conjunctivae normal.     Pupils: Pupils are equal, round, and reactive to light.  Cardiovascular:     Rate and Rhythm: Normal rate and regular rhythm.  Pulmonary:      Effort: Pulmonary effort is normal.     Breath sounds: No rales.  Abdominal:     General: Bowel sounds are normal.     Palpations: Abdomen is soft.     Tenderness: There is no abdominal tenderness.     Hernia: A hernia is present.     Comments: Right inguinal hernia, reducible, a tennis ball sized   Musculoskeletal:     Cervical back: Normal range of motion and neck supple.     Right lower leg: No edema.     Left lower leg: No edema.     Comments: Lower back pain is better.   Skin:    General: Skin is warm and dry.     Comments: A small skin tear lateral left lower leg, no s/s of infection.   Neurological:     General: No focal deficit present.     Mental Status: She is alert. Mental status is at baseline.     Gait: Gait abnormal.     Comments: Oriented to person, place.   Psychiatric:        Mood and Affect: Mood normal.        Behavior: Behavior normal.     Comments: Pleasantly confused.     Labs reviewed: Recent Labs    07/19/20 1900 07/20/20 0104 07/21/20 0342 07/31/20 0000 09/28/20 0000 11/27/20 0000  NA 139  --  140 139 140 140  K 3.9  --  4.1 4.2 4.4 5.0  CL 103  --  108 106 104 108  CO2 25  --  25 25* 23* 28*  GLUCOSE 127*  --  102*  --   --   --   BUN 17  --  23 19 14 16   CREATININE 0.99  --  1.07* 0.9 0.7 0.8  CALCIUM 10.6*  --  9.2 8.7 10.2 8.9  MG  --  2.2  --   --   --   --    Recent Labs    07/31/20 0000 09/28/20 0000 11/27/20 0000  AST 19 19 14   ALT 14 10 6*  ALKPHOS 81 70 47  ALBUMIN 3.7 4.0 3.4*   Recent Labs    07/19/20 1900 07/21/20 0342 07/31/20 0000 09/28/20 0000 11/27/20 0000 12/11/20 0000  WBC 11.5* 8.0   < > 8.8 6.1 5.8  NEUTROABS 9.4*  --    < > 5,139.00 3,032.00 2,378.00  HGB 12.5 11.2*   < > 12.6 10.8* 10.6*  HCT 39.3 34.7*   < > 39 33* 33*  MCV 94.9 97.2  --   --   --   --   PLT 176 153   < > 241 204 205   < > = values in this interval not displayed.   Lab Results  Component Value Date   TSH 2.45 04/24/2020   No  results found for: HGBA1C Lab Results  Component Value Date   CHOL 156 01/08/2021   HDL 34 (A) 01/08/2021   LDLCALC 90 01/08/2021   TRIG 220 (A) 01/08/2021    Significant Diagnostic Results in last 30 days:  No results found.  Assessment/Plan Anemia, chronic disease Hgb 10.6, Fe 66, Vit B12 1010 4/56/25, takes Folic acid  Hyperlipidemia takes Pravastatin, LDL 90 01/08/21  Osteoporosis takes Prolia, Ca, Vit D       Rheumatoid arthritis ([place]) takes Methotrexate 15mg  wkly, Hgb 10.6 12/11/20, also takes Tylenol qid.   Depression with anxiety takes Sertraline, Quetiapine,  off Buspar 09/26/20                PAF (paroxysmal atrial fibrillation) (HCC) Heart rate is in controlled, no rate control med, takes Eliquis 2.5mg  bid   Vascular dementia ([place]) Resides in SNF [facility] for supportive care, takes Memantine 10mg  bid      Slow transit constipation takes Senna, MiraLax   Gait abnormality uses w/c for mobility    Family/ staff Communication: plan of care reviewed with the patient and charge nurse.   Labs/tests ordered:  none  Time spend 35 minutes.

## 2021-05-11 NOTE — Assessment & Plan Note (Signed)
uses w/c for mobility

## 2021-05-11 NOTE — Assessment & Plan Note (Signed)
Hgb 10.6, Fe 66, Vit B12 1010 6/72/89, takes Folic acid

## 2021-05-11 NOTE — Assessment & Plan Note (Signed)
Resides in SNF [facility] for supportive care, takes Memantine 10mg  bid

## 2021-05-11 NOTE — Assessment & Plan Note (Signed)
takes Methotrexate 15mg  wkly, Hgb 10.6 12/11/20, also takes Tylenol qid.

## 2021-05-11 NOTE — Assessment & Plan Note (Signed)
takes Pravastatin, LDL 90 01/08/21

## 2021-05-11 NOTE — Assessment & Plan Note (Signed)
Heart rate is in controlled, no rate control med, takes Eliquis 2.5mg  bid

## 2021-05-27 ENCOUNTER — Non-Acute Institutional Stay (SKILLED_NURSING_FACILITY): Payer: Medicare Other | Admitting: Nurse Practitioner

## 2021-05-27 ENCOUNTER — Encounter: Payer: Self-pay | Admitting: Nurse Practitioner

## 2021-05-27 DIAGNOSIS — E785 Hyperlipidemia, unspecified: Secondary | ICD-10-CM

## 2021-05-27 DIAGNOSIS — R635 Abnormal weight gain: Secondary | ICD-10-CM

## 2021-05-27 DIAGNOSIS — F01518 Vascular dementia, unspecified severity, with other behavioral disturbance: Secondary | ICD-10-CM

## 2021-05-27 DIAGNOSIS — M069 Rheumatoid arthritis, unspecified: Secondary | ICD-10-CM | POA: Diagnosis not present

## 2021-05-27 DIAGNOSIS — S32050D Wedge compression fracture of fifth lumbar vertebra, subsequent encounter for fracture with routine healing: Secondary | ICD-10-CM

## 2021-05-27 DIAGNOSIS — I480 Paroxysmal atrial fibrillation: Secondary | ICD-10-CM | POA: Diagnosis not present

## 2021-05-27 DIAGNOSIS — K5901 Slow transit constipation: Secondary | ICD-10-CM

## 2021-05-27 DIAGNOSIS — R269 Unspecified abnormalities of gait and mobility: Secondary | ICD-10-CM

## 2021-05-27 DIAGNOSIS — M810 Age-related osteoporosis without current pathological fracture: Secondary | ICD-10-CM

## 2021-05-27 DIAGNOSIS — F418 Other specified anxiety disorders: Secondary | ICD-10-CM | POA: Diagnosis not present

## 2021-05-27 DIAGNOSIS — F0151 Vascular dementia with behavioral disturbance: Secondary | ICD-10-CM

## 2021-05-27 DIAGNOSIS — D638 Anemia in other chronic diseases classified elsewhere: Secondary | ICD-10-CM

## 2021-05-27 NOTE — Assessment & Plan Note (Addendum)
Hgb 10.6, Fe 66, Vit B12 1010 05/06/17, takes Folic acid. Update CBC/diff, CMP/eGFR 05/28/21 wbc 6.1, Hgb 11.5, plt 220, neutrophils 73, Na 141, K 4.3, Bun 21, creat 0.99, eGFR 48

## 2021-05-27 NOTE — Assessment & Plan Note (Signed)
takes Memantine 10mg  bid

## 2021-05-27 NOTE — Assessment & Plan Note (Signed)
takes Prolia, Ca, Vit D 

## 2021-05-27 NOTE — Assessment & Plan Note (Signed)
takes Pravastatin, LDL 90 01/08/21

## 2021-05-27 NOTE — Assessment & Plan Note (Signed)
takes Methotrexate 15mg  wkly, Hgb 10.6 12/11/20, also takes Tylenol qid.

## 2021-05-27 NOTE — Assessment & Plan Note (Signed)
Stable, takes Senna, MiraLax.  

## 2021-05-27 NOTE — Assessment & Plan Note (Signed)
Her mood is stable, continue Sertraline, Quetiapine,  off Buspar 09/26/20

## 2021-05-27 NOTE — Progress Notes (Addendum)
Location:   [facility] Room Number: [place] of Service:  SNF (31) Provider:  Man X Mast    Patient Care Team: Virgie Dad, MD as PCP - General (Internal Medicine) Pichardo-Geisinger, Mila Palmer, MD as Referring Physician Nestor Ramp, Effie Shy, MD as Referring Physician (Ophthalmology)  Extended Emergency Contact Information Primary Emergency Contact: Key, Tim Address: [address]          [place], [place] [address] [name] of [place] Phone: [phone] Relation: Nephew Secondary Emergency Contact: Owens Shark Address: [address]          [place], [place] [address] [name] of [place] Phone: [phone] Mobile Phone: [phone] Relation: Other  Code Status:  FULL CODE Goals of care: Advanced Directive information Advanced Directives 05/27/2021  Does Patient Have a Medical Advance Directive? Yes  Type of Paramedic of [place];Living will  Does patient want to make changes to medical advance directive? No - Patient declined  Copy of [place] in Chart? Yes - validated most recent copy scanned in chart (See row information)  Would patient like information on creating a medical advance directive? -  Pre-existing out of facility DNR order (yellow form or pink MOST form) -     Chief Complaint  Patient presents with  . Medical Management of Chronic Issues    Routine follow up.  Marland Kitchen Health Maintenance    Discuss need for shingles vaccine and DEXA scan.    HPI:  Pt is a 87 y.o. female seen today for medical management of chronic diseases.     Past Medical History:  Diagnosis Date  . Acid reflux disease   . Arthritis    rheumatoid  . Atrial fibrillation ([place])   . Back pain   . Bursitis of hip, right   . Cancer ([place])    skin  . Chest pain   . Confusion   . Constipation   . Fuchs' corneal dystrophy   . GERD (gastroesophageal reflux disease)   . Hard of hearing    . Knee pain   . Memory loss   . Paranoia ([place])   . Shoulder pain   . Venous insufficiency    Past Surgical History:  Procedure Laterality Date  . CORNEAL TRANSPLANT  2012   Dr Maudie Mercury   . RECONSTRUCTION OF EYELID     eyelid cancer 10 years ago Dr Victorino Dike   . RECONSTRUCTION OF NOSE     nose cancer/ Dr Nyoka Cowden 10 years ago     Allergies  Allergen Reactions  . Novocain [Procaine] Other (See Comments)    Unknown reaction  . Sulfamethoxazole-Trimethoprim Other (See Comments)    Unknown reaction  . Tape Other (See Comments)    Adhesive tape - unknown reaction  . Codeine Other (See Comments)    Unknown reaction  . Neosporin [Neomycin-Bacitracin Zn-Polymyx] Other (See Comments)    Unknown reaction    Allergies as of 05/27/2021       Reactions   Novocain [procaine] Other (See Comments)   Unknown reaction   Sulfamethoxazole-trimethoprim Other (See Comments)   Unknown reaction   Tape Other (See Comments)   Adhesive tape - unknown reaction   Codeine Other (See Comments)   Unknown reaction   Neosporin [neomycin-bacitracin Zn-polymyx] Other (See Comments)   Unknown reaction        Medication List        Accurate as of May 27, 2021 11:59 PM. If you have  any questions, ask your nurse or doctor.          acetaminophen 325 MG tablet Commonly known as: TYLENOL Take 650 mg by mouth in the morning, at noon, in the evening, and at bedtime.   apixaban 2.5 MG Tabs tablet Commonly known as: ELIQUIS Take 2.5 mg by mouth 2 (two) times daily.   denosumab 60 MG/ML Sosy injection Commonly known as: PROLIA Inject 60 mg into the skin every 6 (six) months. On the 22nd of reach 6th month   folic acid 1 MG tablet Commonly known as: FOLVITE Take 1 mg by mouth daily.   lactose free nutrition Liqd Take 237 mLs by mouth daily.   Lidocaine 4 % Ptch Apply topically. Apply to lower back Q 24hrs Once A Day   memantine 10 MG tablet Commonly known as: NAMENDA Take 10 mg by mouth 2  (two) times daily.   methotrexate 2.5 MG tablet Commonly known as: RHEUMATREX Take 15 mg by mouth every Friday. Caution:Chemotherapy. Protect from light.   mineral oil-hydrophilic petrolatum ointment Apply 1 application topically 2 (two) times daily as needed for dry skin. Apply to forehead, legs, and right mid back   polyethylene glycol 17 g packet Commonly known as: MIRALAX / GLYCOLAX Take 17 g by mouth every other day.   pravastatin 20 MG tablet Commonly known as: PRAVACHOL Take 20 mg by mouth at bedtime.   prednisoLONE acetate 1 % ophthalmic suspension Commonly known as: PRED FORTE Place 1 drop into the right eye daily.   QUEtiapine 25 MG tablet Commonly known as: SEROQUEL Take 12.5 mg by mouth 2 (two) times daily.   senna-docusate 8.6-50 MG tablet Commonly known as: Senokot-S Take 2 tablets by mouth at bedtime.   sertraline 50 MG tablet Commonly known as: ZOLOFT Take 50 mg by mouth daily.   Systane 0.4-0.3 % Soln Generic drug: Polyethyl Glycol-Propyl Glycol Place 1 drop into both eyes in the morning and at bedtime.        Review of Systems  Immunization History  Administered Date(s) Administered  . Influenza, High Dose Seasonal PF 10/02/2013, 08/25/2015, 09/03/2016, 09/05/2017, 09/12/2018, 08/25/2019  . Influenza-Unspecified 10/01/2014, 09/03/2020  . Moderna SARS-COV2 Booster Vaccination 04/21/2021  . Moderna Sars-Covid-2 Vaccination 11/24/2019, 12/22/2019, 09/30/2020  . Pneumococcal Conjugate-13 10/21/2014  . Pneumococcal Polysaccharide-23 01/23/2018  . Td 04/18/2013  . Zoster, Live 09/04/2012   Pertinent  Health Maintenance Due  Topic Date Due  . DEXA SCAN  Never done  . INFLUENZA VACCINE  06/22/2021  . PNA vac Low Risk Adult  Completed   No flowsheet data found. Functional Status Survey:    Vitals:   05/27/21 1328  BP: 130/64  Pulse: 81  Resp: 18  Temp: (!) 97.5 F (36.4 C)  SpO2: 93%  Weight: 158 lb 6.4 oz (71.8 kg)  Height: 5\' 6"   (1.676 m)   Body mass index is 25.57 kg/m. Physical Exam  Labs reviewed: Recent Labs    07/19/20 1900 07/20/20 0104 07/21/20 0342 07/31/20 0000 09/28/20 0000 11/27/20 0000  NA 139  --  140 139 140 140  K 3.9  --  4.1 4.2 4.4 5.0  CL 103  --  108 106 104 108  CO2 25  --  25 25* 23* 28*  GLUCOSE 127*  --  102*  --   --   --   BUN 17  --  23 19 14 16   CREATININE 0.99  --  1.07* 0.9 0.7 0.8  CALCIUM 10.6*  --  9.2 8.7 10.2 8.9  MG  --  2.2  --   --   --   --    Recent Labs    07/31/20 0000 09/28/20 0000 11/27/20 0000  AST 19 19 14   ALT 14 10 6*  ALKPHOS 81 70 47  ALBUMIN 3.7 4.0 3.4*   Recent Labs    07/19/20 1900 07/21/20 0342 07/31/20 0000 09/28/20 0000 11/27/20 0000 12/11/20 0000  WBC 11.5* 8.0   < > 8.8 6.1 5.8  NEUTROABS 9.4*  --    < > 5,139.00 3,032.00 2,378.00  HGB 12.5 11.2*   < > 12.6 10.8* 10.6*  HCT 39.3 34.7*   < > 39 33* 33*  MCV 94.9 97.2  --   --   --   --   PLT 176 153   < > 241 204 205   < > = values in this interval not displayed.   Lab Results  Component Value Date   TSH 2.45 04/24/2020   No results found for: HGBA1C Lab Results  Component Value Date   CHOL 156 01/08/2021   HDL 34 (A) 01/08/2021   LDLCALC 90 01/08/2021   TRIG 220 (A) 01/08/2021    Significant Diagnostic Results in last 30 days:  No results found.    Family/ staff Communication:   Labs/tests ordered:

## 2021-05-27 NOTE — Progress Notes (Signed)
Location:   SNF [place] Room Number: U889 Place of Service:  SNF (31) Provider: [facility] Symphoni Helbling NP  Virgie Dad, MD  Patient Care Team: Virgie Dad, MD as PCP - General (Internal Medicine) Pichardo-Geisinger, Mila Palmer, MD as Referring Physician Nestor Ramp, Effie Shy, MD as Referring Physician (Ophthalmology)  Extended Emergency Contact Information Primary Emergency Contact: Key, Tim Address: [address]          [place], [place] [address] [name] of [place] Phone: [phone] Relation: Nephew Secondary Emergency Contact: Owens Shark Address: [address]          [place], [place] [address] [name] of [place] Phone: [phone] Mobile Phone: [phone] Relation: Other  Code Status:  DNR Goals of care: Advanced Directive information Advanced Directives 05/27/2021  Does Patient Have a Medical Advance Directive? Yes  Type of Paramedic of [place];Living will  Does patient want to make changes to medical advance directive? No - Patient declined  Copy of [place] in Chart? Yes - validated most recent copy scanned in chart (See row information)  Would patient like information on creating a medical advance directive? -  Pre-existing out of facility DNR order (yellow form or pink MOST form) -     Chief Complaint  Patient presents with   Medical Management of Chronic Issues    Routine follow up.   Health Maintenance    Discuss need for shingles vaccine and DEXA scan.    HPI:  Pt is a 87 y.o. female seen today for medical management of chronic diseases.    Gait abnormality, uses w/c for mobility             L5 sustained from fall, 07/19/20, takes Tylenol.               Constipation, takes Senna, MiraLax               Dementia,  takes Memantine 30m bid                 Afib, takes Eliquis 2.577mbid              Depression/anxiety, takes Sertraline, Quetiapine,  off Buspar 09/26/20               RA, takes Methotrexate 1513mkly, Hgb 10.6 12/11/20, also takes Tylenol qid.              OP, takes Prolia, Ca, Vit D                  Anemia, Hgb 10.6, Fe 66, Vit B12 1010 12/18/05/86akes Folic acid             Hyperlipidemia, takes Pravastatin, LDL 90 01/08/21                Past Medical History:  Diagnosis Date   Acid reflux disease    Arthritis    rheumatoid   Atrial fibrillation (HCC)    Back pain    Bursitis of hip, right    Cancer (HCC)    skin   Chest pain    Confusion    Constipation    Fuchs' corneal dystrophy    GERD (gastroesophageal reflux disease)    Hard of hearing    Knee pain    Memory loss    Paranoia (HCC)    Shoulder pain    Venous insufficiency    Past Surgical History:  Procedure Laterality Date  CORNEAL TRANSPLANT  2012   Dr Maudie Mercury    RECONSTRUCTION OF EYELID     eyelid cancer 10 years ago Dr Victorino Dike    RECONSTRUCTION OF NOSE     nose cancer/ Dr Nyoka Cowden 10 years ago     Allergies  Allergen Reactions   Novocain [Procaine] Other (See Comments)    Unknown reaction   Sulfamethoxazole-Trimethoprim Other (See Comments)    Unknown reaction   Tape Other (See Comments)    Adhesive tape - unknown reaction   Codeine Other (See Comments)    Unknown reaction   Neosporin [Neomycin-Bacitracin Zn-Polymyx] Other (See Comments)    Unknown reaction    Allergies as of 05/27/2021       Reactions   Novocain [procaine] Other (See Comments)   Unknown reaction   Sulfamethoxazole-trimethoprim Other (See Comments)   Unknown reaction   Tape Other (See Comments)   Adhesive tape - unknown reaction   Codeine Other (See Comments)   Unknown reaction   Neosporin [neomycin-bacitracin Zn-polymyx] Other (See Comments)   Unknown reaction        Medication List        Accurate as of May 27, 2021 11:59 PM. If you have any questions, ask your nurse or doctor.          acetaminophen 325 MG tablet Commonly known as: TYLENOL Take 650 mg by mouth in the  morning, at noon, in the evening, and at bedtime.   apixaban 2.5 MG Tabs tablet Commonly known as: ELIQUIS Take 2.5 mg by mouth 2 (two) times daily.   denosumab 60 MG/ML Sosy injection Commonly known as: PROLIA Inject 60 mg into the skin every 6 (six) months. On the 22nd of reach 6th month   folic acid 1 MG tablet Commonly known as: FOLVITE Take 1 mg by mouth daily.   lactose free nutrition Liqd Take 237 mLs by mouth daily.   Lidocaine 4 % Ptch Apply topically. Apply to lower back Q 24hrs Once A Day   memantine 10 MG tablet Commonly known as: NAMENDA Take 10 mg by mouth 2 (two) times daily.   methotrexate 2.5 MG tablet Commonly known as: RHEUMATREX Take 15 mg by mouth every Friday. Caution:Chemotherapy. Protect from light.   mineral oil-hydrophilic petrolatum ointment Apply 1 application topically 2 (two) times daily as needed for dry skin. Apply to forehead, legs, and right mid back   polyethylene glycol 17 g packet Commonly known as: MIRALAX / GLYCOLAX Take 17 g by mouth every other day.   pravastatin 20 MG tablet Commonly known as: PRAVACHOL Take 20 mg by mouth at bedtime.   prednisoLONE acetate 1 % ophthalmic suspension Commonly known as: PRED FORTE Place 1 drop into the right eye daily.   QUEtiapine 25 MG tablet Commonly known as: SEROQUEL Take 12.5 mg by mouth 2 (two) times daily.   senna-docusate 8.6-50 MG tablet Commonly known as: Senokot-S Take 2 tablets by mouth at bedtime.   sertraline 50 MG tablet Commonly known as: ZOLOFT Take 50 mg by mouth daily.   Systane 0.4-0.3 % Soln Generic drug: Polyethyl Glycol-Propyl Glycol Place 1 drop into both eyes in the morning and at bedtime.        Review of Systems  Constitutional:  Positive for unexpected weight change. Negative for activity change and fever.  HENT:  Positive for hearing loss. Negative for congestion and trouble swallowing.   Eyes:  Negative for visual disturbance.  Respiratory:   Negative for cough.   Cardiovascular:  Negative for leg swelling.  Gastrointestinal:  Negative for abdominal pain and constipation.  Genitourinary:  Negative for dysuria and urgency.  Musculoskeletal:  Positive for arthralgias, back pain and gait problem.  Skin:  Negative for color change.  Neurological:  Negative for speech difficulty, weakness and light-headedness.       Dementia  Psychiatric/Behavioral:  Positive for confusion. Negative for sleep disturbance. The patient is not nervous/anxious.    Immunization History  Administered Date(s) Administered   Influenza, High Dose Seasonal PF 10/02/2013, 08/25/2015, 09/03/2016, 09/05/2017, 09/12/2018, 08/25/2019   Influenza-Unspecified 10/01/2014, 09/03/2020   Moderna SARS-COV2 Booster Vaccination 04/21/2021   Moderna Sars-Covid-2 Vaccination 11/24/2019, 12/22/2019, 09/30/2020   Pneumococcal Conjugate-13 10/21/2014   Pneumococcal Polysaccharide-23 01/23/2018   Td 04/18/2013   Zoster, Live 09/04/2012   Pertinent  Health Maintenance Due  Topic Date Due   DEXA SCAN  Never done   INFLUENZA VACCINE  06/22/2021   PNA vac Low Risk Adult  Completed   No flowsheet data found. Functional Status Survey:    Vitals:   05/27/21 1328  BP: 130/64  Pulse: 81  Resp: 18  Temp: (!) 97.5 F (36.4 C)  SpO2: 93%  Weight: 158 lb 6.4 oz (71.8 kg)  Height: 5' 6"  (1.676 m)   Body mass index is 25.57 kg/m. Physical Exam Vitals and nursing note reviewed.  Constitutional:      Appearance: Normal appearance.  HENT:     Head: Normocephalic and atraumatic.  Eyes:     Conjunctiva/sclera: Conjunctivae normal.     Pupils: Pupils are equal, round, and reactive to light.  Cardiovascular:     Rate and Rhythm: Normal rate and regular rhythm.  Pulmonary:     Effort: Pulmonary effort is normal.     Breath sounds: No rales.  Abdominal:     General: Bowel sounds are normal.     Palpations: Abdomen is soft.     Tenderness: There is no abdominal  tenderness.     Hernia: A hernia is present.     Comments: Right inguinal hernia, reducible, a tennis ball sized   Musculoskeletal:     Cervical back: Normal range of motion and neck supple.     Right lower leg: No edema.     Left lower leg: No edema.     Comments: Lower back pain is better.   Skin:    General: Skin is warm and dry.     Comments: A small skin tear lateral left lower leg, no s/s of infection.   Neurological:     General: No focal deficit present.     Mental Status: She is alert. Mental status is at baseline.     Gait: Gait abnormal.     Comments: Oriented to person, place.   Psychiatric:        Mood and Affect: Mood normal.        Behavior: Behavior normal.     Comments: Pleasantly confused.     Labs reviewed: Recent Labs    07/19/20 1900 07/20/20 0104 07/21/20 0342 07/31/20 0000 09/28/20 0000 11/27/20 0000  NA 139  --  140 139 140 140  K 3.9  --  4.1 4.2 4.4 5.0  CL 103  --  108 106 104 108  CO2 25  --  25 25* 23* 28*  GLUCOSE 127*  --  102*  --   --   --   BUN 17  --  23 19 14 16   CREATININE 0.99  --  1.07*  0.9 0.7 0.8  CALCIUM 10.6*  --  9.2 8.7 10.2 8.9  MG  --  2.2  --   --   --   --    Recent Labs    07/31/20 0000 09/28/20 0000 11/27/20 0000  AST 19 19 14   ALT 14 10 6*  ALKPHOS 81 70 47  ALBUMIN 3.7 4.0 3.4*   Recent Labs    07/19/20 1900 07/21/20 0342 07/31/20 0000 09/28/20 0000 11/27/20 0000 12/11/20 0000  WBC 11.5* 8.0   < > 8.8 6.1 5.8  NEUTROABS 9.4*  --    < > 5,139.00 3,032.00 2,378.00  HGB 12.5 11.2*   < > 12.6 10.8* 10.6*  HCT 39.3 34.7*   < > 39 33* 33*  MCV 94.9 97.2  --   --   --   --   PLT 176 153   < > 241 204 205   < > = values in this interval not displayed.   Lab Results  Component Value Date   TSH 2.45 04/24/2020   No results found for: HGBA1C Lab Results  Component Value Date   CHOL 156 01/08/2021   HDL 34 (A) 01/08/2021   LDLCALC 90 01/08/2021   TRIG 220 (A) 01/08/2021    Significant Diagnostic  Results in last 30 days:  No results found.  Assessment/Plan  PAF (paroxysmal atrial fibrillation) (HCC) Heart rate is in control, no rate control agent, takes Eliquis   Depression with anxiety Her mood is stable, continue Sertraline, Quetiapine,  off Buspar 09/26/20   Rheumatoid arthritis ([place]) takes Methotrexate 54m wkly, Hgb 10.6 12/11/20, also takes Tylenol qid.   Osteoporosis takes Prolia, Ca, Vit D       Anemia, chronic disease Hgb 10.6, Fe 66, Vit B12 1010 18/71/95 takes Folic acid. Update CBC/diff, CMP/eGFR  Hyperlipidemia takes Pravastatin, LDL 90 01/08/21  Vascular dementia (HCC) takes Memantine 151mbid      Slow transit constipation Stable,  takes Senna, MiraLax   Gait abnormality Uses w/c for mobility.   [place]5 [place] from [place], 07/19/20, takes Tylenol.    Weight gain Weight gained about #5Ibs in the past 2-3 weeks, no apparent fluid retention, observe.   Family/ staff Communication: plan of care reviewed with the patient and charge nurse.   Labs/tests ordered:  CBC/diff, CMP/eGFR   Time spend 35 minutes.

## 2021-05-27 NOTE — Assessment & Plan Note (Signed)
Heart rate is in control, no rate control agent, takes Eliquis

## 2021-05-27 NOTE — Assessment & Plan Note (Signed)
Uses w/c for mobility.

## 2021-05-27 NOTE — Assessment & Plan Note (Signed)
L5 sustained from fall, 07/19/20, takes Tylenol.

## 2021-05-28 ENCOUNTER — Encounter: Payer: Self-pay | Admitting: Nurse Practitioner

## 2021-05-28 LAB — BASIC METABOLIC PANEL
BUN: 21 (ref 4–21)
CO2: 31 — AB (ref 13–22)
Chloride: 104 (ref 99–108)
Creatinine: 1 (ref 0.5–1.1)
Glucose: 68
Potassium: 4.3 (ref 3.4–5.3)
Sodium: 141 (ref 137–147)

## 2021-05-28 LAB — CBC AND DIFFERENTIAL
HCT: 35 — AB (ref 36–46)
Hemoglobin: 11.5 — AB (ref 12.0–16.0)
Neutrophils Absolute: 2946
Platelets: 220 (ref 150–399)
WBC: 6.1

## 2021-05-28 LAB — CBC: RBC: 3.45 — AB (ref 3.87–5.11)

## 2021-05-28 LAB — HEPATIC FUNCTION PANEL
ALT: 7 (ref 7–35)
AST: 15 (ref 13–35)
Alkaline Phosphatase: 45 (ref 25–125)
Bilirubin, Total: 0.5

## 2021-05-28 LAB — COMPREHENSIVE METABOLIC PANEL
Albumin: 3.7 (ref 3.5–5.0)
Calcium: 10.3 (ref 8.7–10.7)
Globulin: 2.2

## 2021-05-28 NOTE — Assessment & Plan Note (Signed)
Weight gained about #5Ibs in the past 2-3 weeks, no apparent fluid retention, observe.

## 2021-07-07 ENCOUNTER — Non-Acute Institutional Stay (SKILLED_NURSING_FACILITY): Payer: Medicare Other | Admitting: Internal Medicine

## 2021-07-07 ENCOUNTER — Encounter: Payer: Self-pay | Admitting: Internal Medicine

## 2021-07-07 DIAGNOSIS — I480 Paroxysmal atrial fibrillation: Secondary | ICD-10-CM | POA: Diagnosis not present

## 2021-07-07 DIAGNOSIS — M069 Rheumatoid arthritis, unspecified: Secondary | ICD-10-CM

## 2021-07-07 DIAGNOSIS — M810 Age-related osteoporosis without current pathological fracture: Secondary | ICD-10-CM

## 2021-07-07 DIAGNOSIS — F418 Other specified anxiety disorders: Secondary | ICD-10-CM

## 2021-07-07 DIAGNOSIS — F0151 Vascular dementia with behavioral disturbance: Secondary | ICD-10-CM | POA: Diagnosis not present

## 2021-07-07 DIAGNOSIS — F01518 Vascular dementia, unspecified severity, with other behavioral disturbance: Secondary | ICD-10-CM

## 2021-07-07 DIAGNOSIS — E785 Hyperlipidemia, unspecified: Secondary | ICD-10-CM

## 2021-07-07 DIAGNOSIS — D638 Anemia in other chronic diseases classified elsewhere: Secondary | ICD-10-CM

## 2021-07-07 NOTE — Progress Notes (Signed)
Location:   [place] Room Number: 50 Place of Service:  SNF [phone]) Provider:  Veleta Miners MD   Adrienne Dad, MD  Patient Care Team: Adrienne Dad, MD as PCP - General (Internal Medicine) Pichardo-Geisinger, Mila Palmer, MD as Referring Physician Nestor Ramp Effie Shy, MD as Referring Physician (Ophthalmology)  Extended Emergency Contact Information Primary Emergency Contact: Adrienne [name] Address: [address]          [place], [place] [address] Adrienne [name] Phone: [phone] Relation: [name] Secondary Emergency Contact: Owens Shark Address: [address]          [place], [place] [address] Adrienne [name] Phone: [phone] Mobile Phone: [phone] Relation: Other  Code Status: Full Code  Goals of care: Advanced Directive information Advanced Directives 07/07/2021  Does Patient Have a Medical Advance Directive? Yes  Type of Paramedic of [place];Living will  Does patient want to make changes to medical advance directive? No - Patient declined  Copy of [place] in Chart? Yes - validated most recent copy scanned in chart (See row information)  Would patient like information on creating a medical advance directive? -  Pre-existing out of facility DNR order (yellow form or pink MOST form) -     Chief Complaint  Patient presents with   Medical Management of Chronic Issues   Quality Metric Gaps    Shingrix, Dexa scan, Flu shot    HPI:  Pt is a 87 y.o. [name] seen today for medical management of chronic diseases.    Patient has a history of PAF on Eliquis, GERD, Rheumatoid arthritis, Hyperlipidemia, Osteoporosis and Dementia with Anxiety and Behavior issues   nondisplaced fracture of L5 with after sustaining a mechanical fall Has been in SNF since then  Doing well. Mostly Wheelchair Dependent Continues to have some behavior issues but Manageable Continues to  gain weight  No Falls No Nursing issues Past Medical History:  Diagnosis Date   Acid reflux disease    Arthritis    rheumatoid   Atrial fibrillation (HCC)    Back pain    Bursitis of hip, right    Cancer (HCC)    skin   Chest pain    Confusion    Constipation    Fuchs' corneal dystrophy    GERD (gastroesophageal reflux disease)    Hard of hearing    Knee pain    Memory loss    Paranoia (HCC)    Shoulder pain    Venous insufficiency    Past Surgical History:  Procedure Laterality Date   CORNEAL TRANSPLANT  2012   Dr Maudie Mercury    RECONSTRUCTION OF EYELID     eyelid cancer 10 years ago Dr Victorino Dike    RECONSTRUCTION OF NOSE     nose cancer/ Dr Nyoka Cowden 10 years ago     Allergies  Allergen Reactions   Novocain [Procaine] Other (See Comments)    Unknown reaction   Sulfamethoxazole-Trimethoprim Other (See Comments)    Unknown reaction   Tape Other (See Comments)    Adhesive tape - unknown reaction   Codeine Other (See Comments)    Unknown reaction   Neosporin [Neomycin-Bacitracin Zn-Polymyx] Other (See Comments)    Unknown reaction    Allergies as of 07/07/2021       Reactions   Novocain [procaine] Other (See Comments)   Unknown reaction   Sulfamethoxazole-trimethoprim Other (See Comments)   Unknown reaction   Tape Other (See  Comments)   Adhesive tape - unknown reaction   Codeine Other (See Comments)   Unknown reaction   Neosporin [neomycin-bacitracin Zn-polymyx] Other (See Comments)   Unknown reaction        Medication List        Accurate as of July 07, 2021 10:22 AM. If you have any questions, ask your nurse or doctor.          acetaminophen 325 MG tablet Commonly known as: TYLENOL Take 650 mg by mouth in the morning, at noon, in the evening, and at bedtime.   apixaban 2.5 MG Tabs tablet Commonly known as: ELIQUIS Take 2.5 mg by mouth 2 (two) times daily.   denosumab 60 MG/ML Sosy injection Commonly known as: PROLIA Inject 60 mg into the  skin every 6 (six) months. On the 22nd of reach 6th month   fluorouracil 5 % cream Commonly known as: EFUDEX Apply topically 2 (two) times daily.   folic acid 1 MG tablet Commonly known as: FOLVITE Take 1 mg by mouth daily.   lactose free nutrition Liqd Take 237 mLs by mouth daily.   Lidocaine 4 % Ptch Apply topically. Apply to lower back Q 24hrs Once A Day   memantine 10 MG tablet Commonly known as: NAMENDA Take 10 mg by mouth 2 (two) times daily.   methotrexate 2.5 MG tablet Commonly known as: RHEUMATREX Take 15 mg by mouth every Friday. Caution:Chemotherapy. Protect from light.   mineral oil-hydrophilic petrolatum ointment Apply 1 application topically 2 (two) times daily as needed for dry skin. Apply to forehead, legs, and right mid back   Multiple Vitamin-Folic Acid Tabs Take 1 tablet by mouth daily. + B12   polyethylene glycol 17 g packet Commonly known as: MIRALAX / GLYCOLAX Take 17 g by mouth every other day.   pravastatin 20 MG tablet Commonly known as: PRAVACHOL Take 20 mg by mouth at bedtime.   prednisoLONE acetate 1 % ophthalmic suspension Commonly known as: PRED FORTE Place 1 drop into the right eye daily.   QUEtiapine 25 MG tablet Commonly known as: SEROQUEL Take 12.5 mg by mouth 2 (two) times daily.   senna-docusate 8.6-50 MG tablet Commonly known as: Senokot-S Take 2 tablets by mouth at bedtime.   sertraline 50 MG tablet Commonly known as: ZOLOFT Take 50 mg by mouth daily.   Systane 0.4-0.3 % Soln Generic drug: Polyethyl Glycol-Propyl Glycol Place 1 drop into both eyes in the morning and at bedtime.        Review of Systems  Unable to perform ROS: Dementia   Immunization History  Administered Date(s) Administered   Influenza, High Dose Seasonal PF 10/02/2013, 08/25/2015, 09/03/2016, 09/05/2017, 09/12/2018, 08/25/2019   Influenza-Unspecified 10/01/2014, 09/03/2020   Moderna SARS-COV2 Booster Vaccination 04/21/2021   Moderna  Sars-Covid-2 Vaccination 11/24/2019, 12/22/2019, 09/30/2020   Pneumococcal Conjugate-13 10/21/2014   Pneumococcal Polysaccharide-23 01/23/2018   Td 04/18/2013   Zoster, Live 09/04/2012   Pertinent  Health Maintenance Due  Topic Date Due   DEXA SCAN  Never done   INFLUENZA VACCINE  06/22/2021   PNA vac Low Risk Adult  Completed   No flowsheet data found. Functional Status Survey:    Vitals:   07/07/21 1002  BP: 118/60  Pulse: Adrienne  Resp: 16  Temp: 97.8 F (36.6 C)  SpO2: 94%  Weight: 159 lb (72.1 kg)  Height: '5\' 6"'$  (1.676 m)   Body mass index is 25.66 kg/m. Physical Exam Constitutional: . Well-developed and well-nourished.  HENT:  Head: Normocephalic.  Mouth/Throat: Oropharynx is clear and moist.  Eyes: Pupils are equal, round, and reactive to light.  Neck: Neck supple.  Cardiovascular: Normal rate and normal heart sounds. Rhythm Irregular No murmur heard. Pulmonary/Chest: Effort normal and breath sounds normal. No respiratory distress. No wheezes. She has no rales.  Abdominal: Soft. Bowel sounds are normal. No distension. There is no tenderness. There is no rebound.  Musculoskeletal: Mild Edema Bilateral Lymphadenopathy: none Neurological: Alert  Skin: Skin is warm and dry.  Psychiatric: Normal mood and affect. Behavior is normal. Thought content normal.   Labs reviewed: Recent Labs    07/19/20 1900 07/20/20 0104 07/21/20 0342 07/31/20 0000 09/28/20 0000 11/27/20 0000  NA 139  --  140 139 140 140  K 3.9  --  4.1 4.2 4.4 5.0  CL 103  --  108 106 104 108  CO2 25  --  25 25* 23* 28*  GLUCOSE 127*  --  102*  --   --   --   BUN 17  --  '23 19 14 16  '$ CREATININE 0.99  --  1.07* 0.9 0.7 0.8  CALCIUM 10.6*  --  9.2 8.7 10.2 8.9  MG  --  2.2  --   --   --   --    Recent Labs    07/31/20 0000 09/28/20 0000 11/27/20 0000  AST '19 19 14  '$ ALT 14 10 6*  ALKPHOS 81 70 47  ALBUMIN 3.7 4.0 3.4*   Recent Labs    07/19/20 1900 07/21/20 0342 07/31/20 0000  09/28/20 0000 11/27/20 0000 12/11/20 0000  WBC 11.5* 8.0   < > 8.8 6.1 5.8  NEUTROABS 9.4*  --    < > 5,139.00 3,032.00 2,378.00  HGB 12.5 11.2*   < > 12.6 10.8* 10.6*  HCT 39.3 34.7*   < > 39 33* 33*  MCV 94.9 97.2  --   --   --   --   PLT 176 153   < > 241 204 205   < > = values in this interval not displayed.   Lab Results  Component Value Date   TSH 2.45 04/24/2020   No results found for: HGBA1C Lab Results  Component Value Date   CHOL 156 01/08/2021   HDL 34 (A) 01/08/2021   LDLCALC 90 01/08/2021   TRIG 220 (A) 01/08/2021    Significant Diagnostic Results in last 30 days:  No results found.  Assessment/Plan PAF (paroxysmal atrial fibrillation) (HCC) On eliquis  Vascular dementia with behavior disturbance (HCC) On Seroquel and Namenda No GDR right now Osteoporosis without current pathological fracture, unspecified osteoporosis type On Prolia Q 6 moonths Depression with anxiety On Zoloft Anemia, chronic disease Hgb is low  Stays stable Rheumatoid arthritis, involving unspecified site, unspecified whether rheumatoid factor present ([employer]) On Methotrexate Hyperlipidemia, unspecified hyperlipidemia type LDL less then 100   Family/ staff Communication:   Labs/tests ordered:  CMP CBC TSH

## 2021-07-10 LAB — CBC: RBC: 3.42 — AB (ref 3.87–5.11)

## 2021-07-10 LAB — BASIC METABOLIC PANEL
BUN: 20 (ref 4–21)
CO2: 29 — AB (ref 13–22)
Chloride: 105 (ref 99–108)
Creatinine: 1 (ref 0.5–1.1)
Glucose: 78
Potassium: 4.2 (ref 3.4–5.3)
Sodium: 142 (ref 137–147)

## 2021-07-10 LAB — HEPATIC FUNCTION PANEL
ALT: 6 — AB (ref 7–35)
AST: 15 (ref 13–35)
Alkaline Phosphatase: 55 (ref 25–125)
Bilirubin, Total: 0.3

## 2021-07-10 LAB — CBC AND DIFFERENTIAL
HCT: 34 — AB (ref 36–46)
Hemoglobin: 10.9 — AB (ref 12.0–16.0)
Neutrophils Absolute: 2614
Platelets: 186 (ref 150–399)
WBC: 5.9

## 2021-07-10 LAB — COMPREHENSIVE METABOLIC PANEL
Albumin: 3.3 — AB (ref 3.5–5.0)
Calcium: 10.2 (ref 8.7–10.7)
Globulin: 2

## 2021-07-10 LAB — TSH: TSH: 3.7 (ref 0.41–5.90)

## 2021-07-31 ENCOUNTER — Encounter: Payer: Self-pay | Admitting: Nurse Practitioner

## 2021-07-31 ENCOUNTER — Non-Acute Institutional Stay (SKILLED_NURSING_FACILITY): Payer: Medicare Other | Admitting: Nurse Practitioner

## 2021-07-31 DIAGNOSIS — M810 Age-related osteoporosis without current pathological fracture: Secondary | ICD-10-CM

## 2021-07-31 DIAGNOSIS — E785 Hyperlipidemia, unspecified: Secondary | ICD-10-CM

## 2021-07-31 DIAGNOSIS — I480 Paroxysmal atrial fibrillation: Secondary | ICD-10-CM

## 2021-07-31 DIAGNOSIS — F418 Other specified anxiety disorders: Secondary | ICD-10-CM

## 2021-07-31 DIAGNOSIS — F01518 Vascular dementia, unspecified severity, with other behavioral disturbance: Secondary | ICD-10-CM

## 2021-07-31 DIAGNOSIS — K5901 Slow transit constipation: Secondary | ICD-10-CM

## 2021-07-31 DIAGNOSIS — S32050D Wedge compression fracture of fifth lumbar vertebra, subsequent encounter for fracture with routine healing: Secondary | ICD-10-CM

## 2021-07-31 DIAGNOSIS — F0151 Vascular dementia with behavioral disturbance: Secondary | ICD-10-CM

## 2021-07-31 DIAGNOSIS — M069 Rheumatoid arthritis, unspecified: Secondary | ICD-10-CM

## 2021-07-31 DIAGNOSIS — D638 Anemia in other chronic diseases classified elsewhere: Secondary | ICD-10-CM | POA: Diagnosis not present

## 2021-07-31 NOTE — Assessment & Plan Note (Signed)
takes Methotrexate 15mg wkly, also takes Tylenol qid.   

## 2021-07-31 NOTE — Assessment & Plan Note (Signed)
Heart rate is in control, takes Eliquis 2.5mg  bid, TSH 3.70 07/10/21

## 2021-07-31 NOTE — Assessment & Plan Note (Signed)
Stable, takes Senna, MiraLax.  

## 2021-07-31 NOTE — Progress Notes (Signed)
Location:   [place] Room Number: 50 Place of Service:  SNF (31) Provider:  Perfecto Purdy, Lennie Odor NP  Adrienne Dad, MD  Patient Care Team: Adrienne Dad, MD as PCP - General (Internal Medicine) Pichardo-Geisinger, Mila Palmer, MD as Referring Physician Nestor Ramp Effie Shy, MD as Referring Physician (Ophthalmology)  Extended Emergency Contact Information Primary Emergency Contact: Key, Tim Address: [address]          [place], [place] [address] [name] of [place] Phone: [phone] Relation: [name] Secondary Emergency Contact: Owens Shark Address: [address]          [place], [place] [address] [name] of [place] Phone: [phone] Mobile Phone: [phone] Relation: Other  Code Status:  Full Code Goals of care: Advanced Directive information Advanced Directives 07/31/2021  Does Patient Have a Medical Advance Directive? Yes  Type of Paramedic of Adrienne [name];Living will  Does patient want to make changes to medical advance directive? No - Patient declined  Copy of [place]ne [name] in Chart? Yes - validated most recent copy scanned in chart (See row information)  Would patient like information on creating a medical advance directive? -  Pre-existing out of facility DNR order (yellow form or pink MOST form) -     Chief Complaint  Patient presents with   Medical Management of Chronic Issues   Quality Metric Gaps    Shingrix, Dexa scan, Flu shot    HPI:  Pt is a 87 y.o. female seen today for medical management of chronic diseases.    Gait abnormality, uses w/c for mobility             L5 sustained from fall, 07/19/20, takes Tylenol.               Constipation, takes Senna, MiraLax               Dementia,  takes Memantine '10mg'$  bid                 Afib, takes Eliquis 2.'5mg'$  bid, TSH 3.70 07/10/21             Depression/anxiety, takes Sertraline, Quetiapine,  off Buspar 09/26/20               RA, takes Methotrexate '15mg'$  wkly, also takes Tylenol qid.              OP, takes Prolia, Ca, Vit D                  Anemia, Fe 66, Vit B12 1010 [id number], takes Folic acid, Hgb [id number]             Hyperlipidemia, takes Pravastatin, LDL 90 01/08/21               Past Medical History:  Diagnosis Date   Acid reflux disease    Arthritis    rheumatoid   Atrial fibrillation (HCC)    Back pain    Bursitis of hip, right    Cancer (HCC)    skin   Chest pain    Confusion    Constipation    Fuchs' corneal dystrophy    GERD (gastroesophageal reflux disease)    Hard of hearing    Knee pain    Memory loss    Paranoia ([place])    Shoulder pain    Venous insufficiency    Past Surgical History:  Procedure Laterality Date   CORNEAL TRANSPLANT  2012  Dr Maudie Mercury    RECONSTRUCTION OF EYELID     eyelid cancer 10 years ago Dr Victorino Dike    RECONSTRUCTION OF NOSE     nose cancer/ Dr Nyoka Cowden 10 years ago     Allergies  Allergen Reactions   Novocain [Procaine] Other (See Comments)    Unknown reaction   Sulfamethoxazole-Trimethoprim Other (See Comments)    Unknown reaction   Tape Other (See Comments)    Adhesive tape - unknown reaction   Codeine Other (See Comments)    Unknown reaction   Neosporin [Neomycin-Bacitracin Zn-Polymyx] Other (See Comments)    Unknown reaction    Allergies as of 07/31/2021       Reactions   Novocain [procaine] Other (See Comments)   Unknown reaction   Sulfamethoxazole-trimethoprim Other (See Comments)   Unknown reaction   Tape Other (See Comments)   Adhesive tape - unknown reaction   Codeine Other (See Comments)   Unknown reaction   Neosporin [neomycin-bacitracin Zn-polymyx] Other (See Comments)   Unknown reaction        Medication List        Accurate as of July 31, 2021 11:59 PM. If you have any questions, ask your nurse or doctor.          acetaminophen 325 MG tablet Commonly known as: TYLENOL Take 650 mg by mouth in the morning, at  noon, in the evening, and at bedtime.   apixaban 2.5 MG Tabs tablet Commonly known as: ELIQUIS Take 2.5 mg by mouth 2 (two) times daily.   denosumab 60 MG/ML Sosy injection Commonly known as: PROLIA Inject 60 mg into the skin every 6 (six) months. On the 22nd of reach 6th month   fluorouracil 5 % cream Commonly known as: EFUDEX Apply topically 2 (two) times daily.   folic acid 1 MG tablet Commonly known as: FOLVITE Take 1 mg by mouth daily.   lactose free nutrition Liqd Take 237 mLs by mouth daily.   Lidocaine 4 % Ptch Apply topically. Apply to lower back Q 24hrs Once A Day   memantine 10 MG tablet Commonly known as: NAMENDA Take 10 mg by mouth 2 (two) times daily.   methotrexate 2.5 MG tablet Commonly known as: RHEUMATREX Take 15 mg by mouth every Friday. Caution:Chemotherapy. Protect from light.   mineral oil-hydrophilic petrolatum ointment Apply 1 application topically 2 (two) times daily as needed for dry skin. Apply to forehead, legs, and right mid back   Multiple Vitamin-Folic Acid Tabs Take 1 tablet by mouth daily. + B12   polyethylene glycol 17 g packet Commonly known as: MIRALAX / GLYCOLAX Take 17 g by mouth every other day.   pravastatin 20 MG tablet Commonly known as: PRAVACHOL Take 20 mg by mouth at bedtime.   prednisoLONE acetate 1 % ophthalmic suspension Commonly known as: PRED FORTE Place 1 drop into the right eye daily.   QUEtiapine 25 MG tablet Commonly known as: SEROQUEL Take 12.5 mg by mouth 2 (two) times daily.   senna-docusate 8.6-50 MG tablet Commonly known as: Senokot-S Take 2 tablets by mouth at bedtime.   sertraline 50 MG tablet Commonly known as: ZOLOFT Take 50 mg by mouth daily.   Systane 0.4-0.3 % Soln Generic drug: Polyethyl Glycol-Propyl Glycol Place 1 drop into both eyes in the morning and at bedtime.       ROS was provided with assistance of staff  Review of Systems  Constitutional:  Negative for fatigue, fever  and unexpected weight change.  HENT:  Positive for hearing loss. Negative for congestion, mouth sores and trouble swallowing.   Eyes:  Negative for visual disturbance.  Respiratory:  Negative for cough.   Cardiovascular:  Negative for leg swelling.  Gastrointestinal:  Negative for abdominal pain and constipation.  Genitourinary:  Negative for dysuria and urgency.  Musculoskeletal:  Positive for arthralgias, back pain and gait problem.  Skin:  Negative for color change.  Neurological:  Negative for speech difficulty, weakness and light-headedness.       Dementia  Psychiatric/Behavioral:  Positive for confusion. Negative for sleep disturbance. The patient is not nervous/anxious.    Immunization History  Administered Date(s) Administered   Influenza, High Dose Seasonal PF 10/02/2013, 08/25/2015, 09/03/2016, 09/05/2017, 09/12/2018, 08/25/2019   Influenza-Unspecified 10/01/2014, 09/03/2020   Moderna SARS-COV2 Booster Vaccination 04/21/2021   Moderna Sars-Covid-2 Vaccination 11/24/2019, 12/22/2019, 09/30/2020   Pneumococcal Conjugate-13 10/21/2014   Pneumococcal Polysaccharide-23 01/23/2018   Td 04/18/2013   Zoster, Live 09/04/2012   Pertinent  Health Maintenance Due  Topic Date Due   DEXA SCAN  Never done   INFLUENZA VACCINE  06/22/2021   PNA vac Low Risk Adult  Completed   No flowsheet data found. Functional Status Survey:    Vitals:   07/31/21 1149  BP: 126/61  Pulse: 68  Resp: 16  Temp: 97.8 F (36.6 C)  SpO2: 96%  Weight: 152 lb 1.6 oz (69 kg)  Height: '5\' 6"'$  (1.676 m)   Body mass index is 24.55 kg/m. Physical Exam Vitals and nursing note reviewed.  Constitutional:      Appearance: Normal appearance.  HENT:     Head: Normocephalic and atraumatic.  Eyes:     Conjunctiva/sclera: Conjunctivae normal.     Pupils: Pupils are equal, round, and reactive to light.  Cardiovascular:     Rate and Rhythm: Normal rate and regular rhythm.  Pulmonary:     Effort: Pulmonary  effort is normal.     Breath sounds: No rales.  Abdominal:     General: Bowel sounds are normal.     Palpations: Abdomen is soft.     Tenderness: There is no abdominal tenderness.     Hernia: A hernia is present.     Comments: Right inguinal hernia, reducible, a tennis ball sized   Musculoskeletal:     Cervical back: Normal range of motion and neck supple.     Right lower leg: No edema.     Left lower leg: No edema.     Comments: Lower back pain is better.   Skin:    General: Skin is warm and dry.     Comments: A small skin tear lateral left lower leg, no s/s of infection.   Neurological:     General: No focal deficit present.     Mental Status: She is alert. Mental status is at baseline.     Gait: Gait abnormal.     Comments: Oriented to person, place.   Psychiatric:        Mood and Affect: Mood normal.        Behavior: Behavior normal.     Comments: Pleasantly confused.     Labs reviewed: Recent Labs    11/27/20 0000 05/28/21 0000 07/10/21 0000  NA 140 141 142  K 5.0 4.3 4.2  CL 108 104 105  CO2 28* 31* 29*  BUN '16 21 20  '$ CREATININE 0.8 1.0 1.0  CALCIUM 8.9 10.3 10.2   Recent Labs    11/27/20 0000 05/28/21 0000 07/10/21 0000  AST  $'14 15 15  'Y$ ALT 6* 7 6*  ALKPHOS 47 45 55  ALBUMIN 3.4* 3.7 3.3*   Recent Labs    12/11/20 0000 05/28/21 0000 07/10/21 0000  WBC 5.8 6.1 5.9  NEUTROABS 2,378.00 2,946.00 2,614.00  HGB 10.6* 11.5* 10.9*  HCT 33* 35* 34*  PLT 205 220 186   Lab Results  Component Value Date   TSH 3.70 07/10/2021   No results found for: HGBA1C Lab Results  Component Value Date   CHOL 156 01/08/2021   HDL 34 (A) 01/08/2021   LDLCALC 90 01/08/2021   TRIG 220 (A) 01/08/2021    Significant Diagnostic Results in last 30 days:  No results found.  Assessment/Plan Depression with anxiety Her mood is stable,  takes Sertraline, Quetiapine,  off Buspar 09/26/20   Rheumatoid arthritis ([place]) takes Methotrexate '15mg'$  wkly, also takes Tylenol  qid.   Osteoporosis akes Prolia, Ca, Vit D     Anemia, chronic disease Fe 66, Vit B12 1010 [id number], takes Folic acid, Hgb [id number]  Hyperlipidemia takes Pravastatin, LDL 90 01/08/21               PAF (paroxysmal atrial fibrillation) (HCC) Heart rate is in control, takes Eliquis 2.'5mg'$  bid, TSH 3.70 07/10/21  Vascular dementia ([place]) takes Memantine '10mg'$  bid, supportive care in SNF [facility]  Slow transit constipation Stable, takes Senna, MiraLax   L5 vertebral fracture ([place]) L5 sustained from fall, 07/19/20, takes Tylenol.      Family/ staff Communication: plan of care reviewed with the patient and charge nurse.   Labs/tests ordered:  none  Time spend 35 minutes.

## 2021-07-31 NOTE — Assessment & Plan Note (Signed)
takes Pravastatin, LDL 90 01/08/21

## 2021-07-31 NOTE — Assessment & Plan Note (Signed)
takes Memantine '10mg'$  bid, supportive care in SNF [facility]

## 2021-07-31 NOTE — Assessment & Plan Note (Signed)
Her mood is stable,  takes Sertraline, Quetiapine,  off Buspar 09/26/20

## 2021-07-31 NOTE — Assessment & Plan Note (Signed)
Fe 66, Vit B12 1010 9/96/92, takes Folic acid, Hgb 49.3 2/41/99

## 2021-07-31 NOTE — Assessment & Plan Note (Signed)
L5 sustained from fall, 07/19/20, takes Tylenol.

## 2021-07-31 NOTE — Assessment & Plan Note (Signed)
akes Prolia, Ca, Vit D

## 2021-08-03 ENCOUNTER — Encounter: Payer: Self-pay | Admitting: Nurse Practitioner

## 2021-08-04 ENCOUNTER — Encounter: Payer: Self-pay | Admitting: Nurse Practitioner

## 2021-08-26 ENCOUNTER — Encounter: Payer: Self-pay | Admitting: Nurse Practitioner

## 2021-08-26 ENCOUNTER — Non-Acute Institutional Stay (SKILLED_NURSING_FACILITY): Payer: Medicare Other | Admitting: Nurse Practitioner

## 2021-08-26 DIAGNOSIS — M810 Age-related osteoporosis without current pathological fracture: Secondary | ICD-10-CM | POA: Diagnosis not present

## 2021-08-26 DIAGNOSIS — D638 Anemia in other chronic diseases classified elsewhere: Secondary | ICD-10-CM | POA: Diagnosis not present

## 2021-08-26 DIAGNOSIS — F418 Other specified anxiety disorders: Secondary | ICD-10-CM

## 2021-08-26 DIAGNOSIS — E785 Hyperlipidemia, unspecified: Secondary | ICD-10-CM

## 2021-08-26 DIAGNOSIS — M069 Rheumatoid arthritis, unspecified: Secondary | ICD-10-CM | POA: Diagnosis not present

## 2021-08-26 DIAGNOSIS — I480 Paroxysmal atrial fibrillation: Secondary | ICD-10-CM

## 2021-08-26 DIAGNOSIS — S32050D Wedge compression fracture of fifth lumbar vertebra, subsequent encounter for fracture with routine healing: Secondary | ICD-10-CM

## 2021-08-26 DIAGNOSIS — K5901 Slow transit constipation: Secondary | ICD-10-CM

## 2021-08-26 DIAGNOSIS — F01C11 Vascular dementia, severe, with agitation: Secondary | ICD-10-CM

## 2021-08-26 DIAGNOSIS — R269 Unspecified abnormalities of gait and mobility: Secondary | ICD-10-CM

## 2021-08-26 NOTE — Assessment & Plan Note (Signed)
takes Memantine 10mg  bid, resides in SNF [facility] for supportive care

## 2021-08-26 NOTE — Assessment & Plan Note (Signed)
takes Prolia, Ca, Vit D 

## 2021-08-26 NOTE — Assessment & Plan Note (Signed)
Uses w/c to get around.

## 2021-08-26 NOTE — Assessment & Plan Note (Signed)
Her mood is stale, takes Sertraline, Quetiapine,  off Buspar 09/26/20

## 2021-08-26 NOTE — Assessment & Plan Note (Signed)
Fe 66, Vit B12 1010 7/84/78, takes Folic acid, Hgb 41.2 07/11/80

## 2021-08-26 NOTE — Progress Notes (Signed)
Location:   SNF [place] Room Number: 1 Place of Service:  SNF (31) Provider: [facility] Sharday Michl NP  Virgie Dad, MD  Patient Care Team: Virgie Dad, MD as PCP - General (Internal Medicine) Pichardo-Geisinger, Mila Palmer, MD as Referring Physician Nestor Ramp, Effie Shy, MD as Referring Physician (Ophthalmology)  Extended Emergency Contact Information Primary Emergency Contact: Key, Tim Address: [address]          [place], [place] [address] [name] of [place] Phone: [phone] Relation: Nephew Secondary Emergency Contact: Owens Shark Address: [address]          [place], [place] [address] [name] of [place] Phone: [phone] Mobile Phone: [phone] Relation: Other  Code Status:  DNR Goals of care: Advanced Directive information Advanced Directives 08/26/2021  Does Patient Have a Medical Advance Directive? Yes  Type of Paramedic of [place];Living will  Does patient want to make changes to medical advance directive? No - Patient declined  Copy of [place] in Chart? Yes - validated most recent copy scanned in chart (See row information)  Would patient like information on creating a medical advance directive? -  Pre-existing out of facility DNR order (yellow form or pink MOST form) -     Chief Complaint  Patient presents with   Medical Management of Chronic Issues    Routine follow up visit   Health Maintenance    Zoster vaccine,dexa scan,flu vaccine     HPI:  Pt is a 87 y.o. female seen today for medical management of chronic diseases.      Gait abnormality, uses w/c for mobility             L5 sustained from fall, 07/19/20, takes Tylenol.               Constipation, takes Senna, MiraLax               Dementia,  takes Memantine 10mg  bid                 Afib, takes Eliquis 2.5mg  bid, TSH 3.70 07/10/21             Depression/anxiety, takes Sertraline, Quetiapine,  off Buspar  09/26/20              RA, takes Methotrexate 15mg  wkly, also takes Tylenol qid.              OP, takes Prolia, Ca, Vit D                  Anemia, Fe 66, Vit B12 1010 11/23/70, takes Folic acid, Hgb 53.6 6/44/03             Hyperlipidemia, takes Pravastatin, LDL 90 01/08/21               Past Medical History:  Diagnosis Date   Acid reflux disease    Arthritis    rheumatoid   Atrial fibrillation (HCC)    Back pain    Bursitis of hip, right    Cancer (HCC)    skin   Chest pain    Confusion    Constipation    Fuchs' corneal dystrophy    GERD (gastroesophageal reflux disease)    Hard of hearing    Knee pain    Memory loss    Paranoia (HCC)    Shoulder pain    Venous insufficiency    Past Surgical History:  Procedure Laterality Date  CORNEAL TRANSPLANT  2012   Dr Maudie Mercury    RECONSTRUCTION OF EYELID     eyelid cancer 10 years ago Dr Victorino Dike    RECONSTRUCTION OF NOSE     nose cancer/ Dr Nyoka Cowden 10 years ago     Allergies  Allergen Reactions   Novocain [Procaine] Other (See Comments)    Unknown reaction   Sulfamethoxazole-Trimethoprim Other (See Comments)    Unknown reaction   Tape Other (See Comments)    Adhesive tape - unknown reaction   Codeine Other (See Comments)    Unknown reaction   Neosporin [Neomycin-Bacitracin Zn-Polymyx] Other (See Comments)    Unknown reaction    Allergies as of 08/26/2021       Reactions   Novocain [procaine] Other (See Comments)   Unknown reaction   Sulfamethoxazole-trimethoprim Other (See Comments)   Unknown reaction   Tape Other (See Comments)   Adhesive tape - unknown reaction   Codeine Other (See Comments)   Unknown reaction   Neosporin [neomycin-bacitracin Zn-polymyx] Other (See Comments)   Unknown reaction        Medication List        Accurate as of August 26, 2021 11:59 PM. If you have any questions, ask your nurse or doctor.          acetaminophen 325 MG tablet Commonly known as: TYLENOL Take 650 mg by mouth  in the morning, at noon, in the evening, and at bedtime.   apixaban 2.5 MG Tabs tablet Commonly known as: ELIQUIS Take 2.5 mg by mouth 2 (two) times daily.   denosumab 60 MG/ML Sosy injection Commonly known as: PROLIA Inject 60 mg into the skin every 6 (six) months. On the 22nd of reach 6th month   fluorouracil 5 % cream Commonly known as: EFUDEX Apply topically 2 (two) times daily.   folic acid 1 MG tablet Commonly known as: FOLVITE Take 1 mg by mouth daily.   lactose free nutrition Liqd Take 237 mLs by mouth daily.   Lidocaine 4 % Ptch Apply topically. Apply to lower back Q 24hrs Once A Day   memantine 10 MG tablet Commonly known as: NAMENDA Take 10 mg by mouth 2 (two) times daily.   methotrexate 2.5 MG tablet Commonly known as: RHEUMATREX Take 15 mg by mouth every Friday. Caution:Chemotherapy. Protect from light.   mineral oil-hydrophilic petrolatum ointment Apply 1 application topically 2 (two) times daily as needed for dry skin. Apply to forehead, legs, and right mid back   Multiple Vitamin-Folic Acid Tabs Take 1 tablet by mouth daily. + B12   polyethylene glycol 17 g packet Commonly known as: MIRALAX / GLYCOLAX Take 17 g by mouth every other day.   pravastatin 20 MG tablet Commonly known as: PRAVACHOL Take 20 mg by mouth at bedtime.   prednisoLONE acetate 1 % ophthalmic suspension Commonly known as: PRED FORTE Place 1 drop into the right eye daily.   QUEtiapine 25 MG tablet Commonly known as: SEROQUEL Take 12.5 mg by mouth 2 (two) times daily.   senna-docusate 8.6-50 MG tablet Commonly known as: Senokot-S Take 2 tablets by mouth at bedtime.   sertraline 50 MG tablet Commonly known as: ZOLOFT Take 50 mg by mouth daily.   Systane 0.4-0.3 % Soln Generic drug: Polyethyl Glycol-Propyl Glycol Place 1 drop into both eyes in the morning and at bedtime.        Review of Systems  Immunization History  Administered Date(s) Administered    Influenza, High Dose Seasonal PF 10/02/2013,  08/25/2015, 09/03/2016, 09/05/2017, 09/12/2018, 08/25/2019   Influenza-Unspecified 10/01/2014, 09/03/2020   Moderna SARS-COV2 Booster Vaccination 04/21/2021   Moderna Sars-Covid-2 Vaccination 11/24/2019, 12/22/2019, 09/30/2020   Pneumococcal Conjugate-13 10/21/2014   Pneumococcal Polysaccharide-23 01/23/2018   Td 04/18/2013   Zoster, Live 09/04/2012   Pertinent  Health Maintenance Due  Topic Date Due   DEXA SCAN  Never done   INFLUENZA VACCINE  06/22/2021   No flowsheet data found. Functional Status Survey:    Vitals:   08/26/21 1527  BP: 110/82  Resp: 18  Temp: 98.2 F (36.8 C)  SpO2: 97%  Weight: 151 lb 12.8 oz (68.9 kg)  Height: 5\' 6"  (1.676 m)   Body mass index is 24.5 kg/m. Physical Exam Vitals and nursing note reviewed.  Constitutional:      Appearance: Normal appearance.  HENT:     Head: Normocephalic and atraumatic.  Eyes:     Conjunctiva/sclera: Conjunctivae normal.     Pupils: Pupils are equal, round, and reactive to light.  Cardiovascular:     Rate and Rhythm: Normal rate and regular rhythm.  Pulmonary:     Effort: Pulmonary effort is normal.     Breath sounds: No rales.  Abdominal:     General: Bowel sounds are normal.     Palpations: Abdomen is soft.     Tenderness: There is no abdominal tenderness.     Hernia: A hernia is present.     Comments: Right inguinal hernia, reducible, a tennis ball sized   Musculoskeletal:     Cervical back: Normal range of motion and neck supple.     Right lower leg: No edema.     Left lower leg: No edema.     Comments: Lower back pain is better.   Skin:    General: Skin is warm and dry.     Comments: A small skin tear lateral left lower leg, no s/s of infection.   Neurological:     General: No focal deficit present.     Mental Status: She is alert. Mental status is at baseline.     Gait: Gait abnormal.     Comments: Oriented to person, place.   Psychiatric:         Mood and Affect: Mood normal.        Behavior: Behavior normal.     Comments: Pleasantly confused.     Labs reviewed: Recent Labs    11/27/20 0000 05/28/21 0000 07/10/21 0000  NA 140 141 142  K 5.0 4.3 4.2  CL 108 104 105  CO2 28* 31* 29*  BUN 16 21 20   CREATININE 0.8 1.0 1.0  CALCIUM 8.9 10.3 10.2   Recent Labs    11/27/20 0000 05/28/21 0000 07/10/21 0000  AST 14 15 15   ALT 6* 7 6*  ALKPHOS 47 45 55  ALBUMIN 3.4* 3.7 3.3*   Recent Labs    12/11/20 0000 05/28/21 0000 07/10/21 0000  WBC 5.8 6.1 5.9  NEUTROABS 2,378.00 2,946.00 2,614.00  HGB 10.6* 11.5* 10.9*  HCT 33* 35* 34*  PLT 205 220 186   Lab Results  Component Value Date   TSH 3.70 07/10/2021   No results found for: HGBA1C Lab Results  Component Value Date   CHOL 156 01/08/2021   HDL 34 (A) 01/08/2021   LDLCALC 90 01/08/2021   TRIG 220 (A) 01/08/2021    Significant Diagnostic Results in last 30 days:  No results found.  Assessment/Plan  Rheumatoid arthritis ([place])  takes Methotrexate 15mg  wkly, also  takes Tylenol qid.   Osteoporosis takes Prolia, Ca, Vit D       Anemia, chronic disease Fe 66, Vit B12 1010 0/34/74, takes Folic acid, Hgb 25.9 5/63/87  Hyperlipidemia  takes Pravastatin, LDL 90 01/08/21               PAF (paroxysmal atrial fibrillation) (HCC)  Heart rate is in control, takes Eliquis 2.5mg  bid, TSH 3.70 07/10/21  Depression with anxiety Her mood is stale, takes Sertraline, Quetiapine,  off Buspar 09/26/20   Vascular dementia ([place]) takes Memantine 10mg  bid, resides in SNF [facility] for supportive care  Slow transit constipation Stable,  takes Senna, MiraLax   Gait abnormality Uses w/c to get around.   L5 vertebral fracture ([place]) L5 sustained from fall, 07/19/20, takes Tylenol.     Family/ staff Communication: plan of care reviewed with the patient and charge nurse.   Labs/tests ordered:  none    Time spend 35 minutes.

## 2021-08-26 NOTE — Assessment & Plan Note (Signed)
takes Methotrexate 15mg wkly, also takes Tylenol qid.   

## 2021-08-26 NOTE — Assessment & Plan Note (Signed)
takes Pravastatin, LDL 90 01/08/21

## 2021-08-26 NOTE — Assessment & Plan Note (Signed)
Stable, takes Senna, MiraLax.  

## 2021-08-26 NOTE — Assessment & Plan Note (Signed)
Heart rate is in control, takes Eliquis 2.5mg  bid, TSH 3.70 07/10/21

## 2021-08-26 NOTE — Assessment & Plan Note (Signed)
L5 sustained from fall, 07/19/20, takes Tylenol.

## 2021-08-28 ENCOUNTER — Encounter: Payer: Self-pay | Admitting: Nurse Practitioner

## 2021-08-28 ENCOUNTER — Non-Acute Institutional Stay (SKILLED_NURSING_FACILITY): Payer: Medicare Other | Admitting: Nurse Practitioner

## 2021-08-28 DIAGNOSIS — E785 Hyperlipidemia, unspecified: Secondary | ICD-10-CM

## 2021-08-28 DIAGNOSIS — K5901 Slow transit constipation: Secondary | ICD-10-CM

## 2021-08-28 DIAGNOSIS — M069 Rheumatoid arthritis, unspecified: Secondary | ICD-10-CM

## 2021-08-28 DIAGNOSIS — S32050D Wedge compression fracture of fifth lumbar vertebra, subsequent encounter for fracture with routine healing: Secondary | ICD-10-CM | POA: Diagnosis not present

## 2021-08-28 DIAGNOSIS — I480 Paroxysmal atrial fibrillation: Secondary | ICD-10-CM

## 2021-08-28 DIAGNOSIS — R269 Unspecified abnormalities of gait and mobility: Secondary | ICD-10-CM | POA: Diagnosis not present

## 2021-08-28 DIAGNOSIS — R253 Fasciculation: Secondary | ICD-10-CM

## 2021-08-28 DIAGNOSIS — D638 Anemia in other chronic diseases classified elsewhere: Secondary | ICD-10-CM

## 2021-08-28 DIAGNOSIS — F01C11 Vascular dementia, severe, with agitation: Secondary | ICD-10-CM

## 2021-08-28 DIAGNOSIS — F418 Other specified anxiety disorders: Secondary | ICD-10-CM

## 2021-08-28 LAB — COMPREHENSIVE METABOLIC PANEL
Albumin: 4.1 (ref 3.5–5.0)
Calcium: 9.8 (ref 8.7–10.7)
Globulin: 2.7

## 2021-08-28 LAB — CBC AND DIFFERENTIAL
HCT: 36 (ref 36–46)
Hemoglobin: 12 (ref 12.0–16.0)
Neutrophils Absolute: 2827
Platelets: 286 (ref 150–399)
WBC: 5.7

## 2021-08-28 LAB — BASIC METABOLIC PANEL
BUN: 17 (ref 4–21)
CO2: 31 — AB (ref 13–22)
Chloride: 102 (ref 99–108)
Creatinine: 1 (ref 0.5–1.1)
Glucose: 90
Potassium: 4.5 (ref 3.4–5.3)
Sodium: 139 (ref 137–147)

## 2021-08-28 LAB — HEPATIC FUNCTION PANEL
ALT: 9 (ref 7–35)
AST: 15 (ref 13–35)
Alkaline Phosphatase: 75 (ref 25–125)
Bilirubin, Total: 0.4

## 2021-08-28 LAB — CBC: RBC: 3.73 — AB (ref 3.87–5.11)

## 2021-08-28 NOTE — Assessment & Plan Note (Signed)
Fe 66, Vit B12 1010 9/96/92, takes Folic acid, Hgb 49.3 2/41/99

## 2021-08-28 NOTE — Progress Notes (Signed)
Location:   [facility] Room Number: 50-A Place of Service:  SNF (31) Provider: Marlana Latus NP    Patient Care Team: Virgie Dad, MD as PCP - General (Internal Medicine) Pichardo-Geisinger, Mila Palmer, MD as Referring Physician Nestor Ramp, Effie Shy, MD as Referring Physician (Ophthalmology)  Extended Emergency Contact Information Primary Emergency Contact: Key, Tim Address: [address]          [place], [place] [address] [name] of [place] Phone: [phone] Relation: Nephew Secondary Emergency Contact: Owens Shark Address: [address]          [place], [place] [address] [name] of [place] Phone: [phone] Mobile Phone: [phone] Relation: Other  Code Status: DNR Goals of care: Advanced Directive information Advanced Directives 08/28/2021  Does Patient Have a Medical Advance Directive? Yes  Type of Paramedic of [place];Living will  Does patient want to make changes to medical advance directive? No - Patient declined  Copy of [place] in Chart? Yes - validated most recent copy scanned in chart (See row information)  Would patient like information on creating a medical advance directive? -  Pre-existing out of facility DNR order (yellow form or pink MOST form) -     Chief Complaint  Patient presents with   Acute Visit    Left face twitching, and Jerking arms episode.      HPI:  Pt is a 87 y.o. female seen today for an acute visit for reported patient's left face twitching, arms jerking episode, coughing after drinking water. The patient stated she feels tired, sleepy. Denied chest pain, no O2 desaturation, afebrile, no new focal weakness, or slurred speech.   Gait abnormality, uses w/c for mobility             L5 sustained from fall, 07/19/20, takes Tylenol.               Constipation, takes Senna, MiraLax               Dementia,  takes Memantine 66m bid                  Afib, takes Eliquis 2.534mbid, TSH 3.70 07/10/21             Depression/anxiety, takes Sertraline, Quetiapine,  off Buspar 09/26/20              RA, takes Methotrexate 1589mkly, also takes Tylenol qid.              OP, takes Prolia, Ca, Vit D                  Anemia, Fe 66, Vit B12 1010 12/16/98/93akes Folic acid, Hgb 10.81.812/99/37          Hyperlipidemia, takes Pravastatin, LDL 90 01/08/21   Past Medical History:  Diagnosis Date   Acid reflux disease    Arthritis    rheumatoid   Atrial fibrillation (HCC)    Back pain    Bursitis of hip, right    Cancer (HCC)    skin   Chest pain    Confusion    Constipation    Fuchs' corneal dystrophy    GERD (gastroesophageal reflux disease)    Hard of hearing    Knee pain    Memory loss    Paranoia (HCC)    Shoulder pain    Venous insufficiency    Past Surgical History:  Procedure Laterality Date   CORNEAL TRANSPLANT  2012   Dr Maudie Mercury    RECONSTRUCTION OF EYELID     eyelid cancer 10 years ago Dr Victorino Dike    RECONSTRUCTION OF NOSE     nose cancer/ Dr Nyoka Cowden 10 years ago     Allergies  Allergen Reactions   Novocain [Procaine] Other (See Comments)    Unknown reaction   Sulfamethoxazole-Trimethoprim Other (See Comments)    Unknown reaction   Tape Other (See Comments)    Adhesive tape - unknown reaction   Codeine Other (See Comments)    Unknown reaction   Neosporin [Neomycin-Bacitracin Zn-Polymyx] Other (See Comments)    Unknown reaction    Allergies as of 08/28/2021       Reactions   Novocain [procaine] Other (See Comments)   Unknown reaction   Sulfamethoxazole-trimethoprim Other (See Comments)   Unknown reaction   Tape Other (See Comments)   Adhesive tape - unknown reaction   Codeine Other (See Comments)   Unknown reaction   Neosporin [neomycin-bacitracin Zn-polymyx] Other (See Comments)   Unknown reaction        Medication List        Accurate as of August 28, 2021 11:59 PM. If you have any questions,  ask your nurse or doctor.          acetaminophen 325 MG tablet Commonly known as: TYLENOL Take 650 mg by mouth in the morning, at noon, in the evening, and at bedtime.   apixaban 2.5 MG Tabs tablet Commonly known as: ELIQUIS Take 2.5 mg by mouth 2 (two) times daily.   denosumab 60 MG/ML Sosy injection Commonly known as: PROLIA Inject 60 mg into the skin every 6 (six) months. On the 22nd of reach 6th month   fluorouracil 5 % cream Commonly known as: EFUDEX Apply topically 2 (two) times daily.   folic acid 1 MG tablet Commonly known as: FOLVITE Take 1 mg by mouth daily.   lactose free nutrition Liqd Take 237 mLs by mouth daily.   Lidocaine 4 % Ptch Apply topically. Apply to lower back Q 24hrs Once A Day   memantine 10 MG tablet Commonly known as: NAMENDA Take 10 mg by mouth 2 (two) times daily.   methotrexate 2.5 MG tablet Commonly known as: RHEUMATREX Take 15 mg by mouth every Friday. Caution:Chemotherapy. Protect from light.   mineral oil-hydrophilic petrolatum ointment Apply 1 application topically 2 (two) times daily as needed for dry skin. Apply to forehead, legs, and right mid back   Multiple Vitamin-Folic Acid Tabs Take 1 tablet by mouth daily. + B12   polyethylene glycol 17 g packet Commonly known as: MIRALAX / GLYCOLAX Take 17 g by mouth every other day.   pravastatin 20 MG tablet Commonly known as: PRAVACHOL Take 20 mg by mouth at bedtime.   prednisoLONE acetate 1 % ophthalmic suspension Commonly known as: PRED FORTE Place 1 drop into the right eye daily.   QUEtiapine 25 MG tablet Commonly known as: SEROQUEL Take 12.5 mg by mouth 2 (two) times daily.   senna-docusate 8.6-50 MG tablet Commonly known as: Senokot-S Take 2 tablets by mouth at bedtime.   sertraline 50 MG tablet Commonly known as: ZOLOFT Take 50 mg by mouth daily.   Systane 0.4-0.3 % Soln Generic drug: Polyethyl Glycol-Propyl Glycol Place 1 drop into both eyes in the morning  and at bedtime.       ROS was provided with assistance of staff and the patient's HPOA niece.  Review  of Systems  Constitutional:  Positive for activity change, appetite change and fatigue. Negative for fever.  HENT:  Positive for hearing loss and trouble swallowing.   Eyes:  Negative for visual disturbance.  Respiratory:  Positive for cough.        When drinking water.   Cardiovascular:  Negative for leg swelling.  Gastrointestinal:  Negative for abdominal pain, nausea and vomiting.  Genitourinary:  Negative for dysuria and urgency.  Musculoskeletal:  Positive for arthralgias, back pain and gait problem.  Skin:  Negative for color change.  Neurological:  Negative for tremors, facial asymmetry, speech difficulty, weakness, light-headedness and headaches.       Dementia. Reported left facial twitching and arms jerking episode.   Psychiatric/Behavioral:  Positive for confusion. Negative for sleep disturbance. The patient is not nervous/anxious.    Immunization History  Administered Date(s) Administered   Influenza, High Dose Seasonal PF 10/02/2013, 08/25/2015, 09/03/2016, 09/05/2017, 09/12/2018, 08/25/2019   Influenza-Unspecified 10/01/2014, 09/03/2020   Moderna SARS-COV2 Booster Vaccination 04/21/2021   Moderna Sars-Covid-2 Vaccination 11/24/2019, 12/22/2019, 09/30/2020   Pneumococcal Conjugate-13 10/21/2014   Pneumococcal Polysaccharide-23 01/23/2018   Td 04/18/2013   Zoster, Live 09/04/2012   Pertinent  Health Maintenance Due  Topic Date Due   DEXA SCAN  Never done   INFLUENZA VACCINE  06/22/2021   No flowsheet data found. Functional Status Survey:    Vitals:   08/28/21 1535  BP: 110/82  Pulse: 85  Resp: 18  Temp: 97.7 F (36.5 C)  SpO2: 94%  Weight: 151 lb 12.8 oz (68.9 kg)  Height: 5' 6"  (1.676 m)   Body mass index is 24.5 kg/m. Physical Exam Vitals and nursing note reviewed.  Constitutional:      Comments: Seems more tired than usual  HENT:     Head:  Normocephalic and atraumatic.  Eyes:     Extraocular Movements: Extraocular movements intact.     Conjunctiva/sclera: Conjunctivae normal.     Pupils: Pupils are equal, round, and reactive to light.  Cardiovascular:     Rate and Rhythm: Normal rate and regular rhythm.  Pulmonary:     Effort: Pulmonary effort is normal.     Breath sounds: No rales.  Abdominal:     General: Bowel sounds are normal.     Palpations: Abdomen is soft.     Tenderness: There is no abdominal tenderness.     Hernia: A hernia is present.     Comments: Right inguinal hernia, reducible, a tennis ball sized   Musculoskeletal:     Cervical back: Normal range of motion and neck supple.     Right lower leg: No edema.     Left lower leg: No edema.     Comments: Lower back pain is better.   Skin:    General: Skin is warm and dry.     Comments: A small skin tear lateral left lower leg, no s/s of infection.   Neurological:     General: No focal deficit present.     Mental Status: She is alert. Mental status is at baseline.     Gait: Gait abnormal.     Comments: Oriented to person, place.   Psychiatric:        Mood and Affect: Mood normal.        Behavior: Behavior normal.     Comments: Pleasantly confused.     Labs reviewed: Recent Labs    11/27/20 0000 05/28/21 0000 07/10/21 0000  NA 140 141 142  K 5.0 4.3  4.2  CL 108 104 105  CO2 28* 31* 29*  BUN 16 21 20   CREATININE 0.8 1.0 1.0  CALCIUM 8.9 10.3 10.2   Recent Labs    11/27/20 0000 05/28/21 0000 07/10/21 0000  AST 14 15 15   ALT 6* 7 6*  ALKPHOS 47 45 55  ALBUMIN 3.4* 3.7 3.3*   Recent Labs    12/11/20 0000 05/28/21 0000 07/10/21 0000  WBC 5.8 6.1 5.9  NEUTROABS 2,378.00 2,946.00 2,614.00  HGB 10.6* 11.5* 10.9*  HCT 33* 35* 34*  PLT 205 220 186   Lab Results  Component Value Date   TSH 3.70 07/10/2021   No results found for: HGBA1C Lab Results  Component Value Date   CHOL 156 01/08/2021   HDL 34 (A) 01/08/2021   LDLCALC 90  01/08/2021   TRIG 220 (A) 01/08/2021    Significant Diagnostic Results in last 30 days:  No results found.  Assessment/Plan: Muscle twitching reported patient's left face twitching, arms jerking episode, coughing after drinking water. The patient stated she feels tired, sleepy. Denied chest pain, no O2 desaturation, afebrile, no new focal weakness, or slurred speech. Will decrease Seroquel to 12.73m qd to eliminate possible AR of EPS, ST to eval. CXR to r/o PNA. Update CBC/diff, CMP/eGFR. Neuro check q4hrs x 72hours for s/s seizure vs CVA  Gait abnormality Gait abnormality, uses w/c for mobility  L5 vertebral fracture ([place] L5 sustained from fall, 07/19/20, takes Tylenol.   Slow transit constipation  takes Senna, MiraLax   Vascular dementia ([place] takes Memantine 164mbid      PAF (paroxysmal atrial fibrillation) (HCC) Heart rate is in control, takes Eliquis 2.71m62mid, TSH 3.70 07/10/21  Depression with anxiety  takes Sertraline, Quetiapine,  off Buspar 09/26/20   Rheumatoid arthritis ([place] Methotrexate 171m38mly, also takes Tylenol qid.   Anemia, chronic disease Fe 66, Vit B12 1010 1/204/31/54kes Folic acid, Hgb 10.900.896/76/19perlipidemia  takes Pravastatin, LDL 90 01/08/21    Family/ staff Communication: plan of care reviewed with the patient, the patient's HPOA niece, and charge nurse.   Labs/tests ordered:  CBC/diff, CMP/eGFR, CXR  Time spend 35 minutes.

## 2021-08-28 NOTE — Assessment & Plan Note (Signed)
takes Senna, MiraLax   

## 2021-08-28 NOTE — Assessment & Plan Note (Signed)
takes Methotrexate 15mg wkly, also takes Tylenol qid.   

## 2021-08-28 NOTE — Assessment & Plan Note (Signed)
takes Sertraline, Quetiapine,  off Buspar 09/26/20

## 2021-08-28 NOTE — Assessment & Plan Note (Signed)
takes Memantine 10mg  bid

## 2021-08-28 NOTE — Assessment & Plan Note (Signed)
Gait abnormality, uses w/c for mobility

## 2021-08-28 NOTE — Assessment & Plan Note (Addendum)
reported patient's left face twitching, arms jerking episode, coughing after drinking water. The patient stated she feels tired, sleepy. Denied chest pain, no O2 desaturation, afebrile, no new focal weakness, or slurred speech. Will decrease Seroquel to 12.48m qd to eliminate possible AR of EPS, ST to eval. CXR to r/o PNA. Update CBC/diff, CMP/eGFR. Neuro check q4hrs x 72hours for s/s seizure vs CVA 08/28/21 wbc 5.7, Hgb 12.0, plt 286, neutrophils 49.6, Na 139, K 4.5, Bun 17, creat 1.0, eGFR 51 08/29/21 CXR a left base infiltrate, no s/s of PNA, pending ST eval.

## 2021-08-28 NOTE — Assessment & Plan Note (Signed)
L5 sustained from fall, 07/19/20, takes Tylenol.

## 2021-08-28 NOTE — Assessment & Plan Note (Signed)
Heart rate is in control, takes Eliquis 2.5mg  bid, TSH 3.70 07/10/21

## 2021-08-28 NOTE — Assessment & Plan Note (Signed)
takes Pravastatin, LDL 90 01/08/21

## 2021-08-31 ENCOUNTER — Encounter: Payer: Self-pay | Admitting: Nurse Practitioner

## 2021-09-09 ENCOUNTER — Non-Acute Institutional Stay (SKILLED_NURSING_FACILITY): Payer: Medicare Other | Admitting: Orthopedic Surgery

## 2021-09-09 ENCOUNTER — Encounter: Payer: Self-pay | Admitting: Orthopedic Surgery

## 2021-09-09 DIAGNOSIS — K5901 Slow transit constipation: Secondary | ICD-10-CM

## 2021-09-09 DIAGNOSIS — M810 Age-related osteoporosis without current pathological fracture: Secondary | ICD-10-CM

## 2021-09-09 DIAGNOSIS — E785 Hyperlipidemia, unspecified: Secondary | ICD-10-CM | POA: Diagnosis not present

## 2021-09-09 DIAGNOSIS — M069 Rheumatoid arthritis, unspecified: Secondary | ICD-10-CM

## 2021-09-09 DIAGNOSIS — F01518 Vascular dementia, unspecified severity, with other behavioral disturbance: Secondary | ICD-10-CM | POA: Diagnosis not present

## 2021-09-09 DIAGNOSIS — I480 Paroxysmal atrial fibrillation: Secondary | ICD-10-CM

## 2021-09-09 DIAGNOSIS — F418 Other specified anxiety disorders: Secondary | ICD-10-CM

## 2021-09-09 DIAGNOSIS — D638 Anemia in other chronic diseases classified elsewhere: Secondary | ICD-10-CM

## 2021-09-09 NOTE — Progress Notes (Signed)
Location:   [facility] Room Number: [place] of Service:  SNF [id number]) Provider:  Windell Moulding, NP    Patient Care Team: Virgie Dad, MD as PCP - General (Internal Medicine) Pichardo-Geisinger, Mila Palmer, MD as Referring Physician Nestor Ramp Effie Shy, MD as Referring Physician (Ophthalmology)  Extended Emergency Contact Information Primary Emergency Contact: Key, Tim Address: [address]          [place], [place] [address] [name] of [place] Phone: [phone] Relation: Nephew Secondary Emergency Contact: Owens Shark Address: [address]          [place],  [address] [name] of [place] Phone: [phone] Mobile Phone: [phone] Relation: Other  Code Status:  FULL CODE Goals of care: Advanced Directive information Advanced Directives 09/09/2021  Does Patient Have a Medical Advance Directive? Yes  Type of Paramedic of [place];Living will  Does patient want to make changes to medical advance directive? No - Patient declined  Copy of [place] in Chart? Yes - validated most recent copy scanned in chart (See row information)  Would patient like information on creating a medical advance directive? -  Pre-existing out of facility DNR order (yellow form or pink MOST form) -     Chief Complaint  Patient presents with   Acute Visit    Inappropriate behaviors.     HPI:  Pt is a 87 y.o. female seen today for an acute visit for inappropriate behaviors.   She currently resides on the skilled nursing unit at [facility] Past medical history includes: PAF, vascular dementia, constipation, osteoporosis, rheumatoid arthritis, anemia, depression/anxiety, frequent falls.   In the past 2 days, staff reports she has been trying to kiss other residents. In addition, she is also been inappropriately touching employees. 10/07 she was seen by NP for left facial twitching  and jerking arms episode. Seroquel was reduced to 12.5 mg qhs. No reports of additional EPS symptoms. Nursing staff unable to describe EPS symptoms, reports they only lasted for < hour, no there incidents observed.    PAF- rate controlled without medication, remains on Eliquis for clot prevention HLD- LDL 90 01/08/2021, remains on pravastatin Vascular dementia- CT head 2021 noted chronic microvascular ischemic changes, last MMSE 24/30 2020, remains on namenda Constipation- LBM 10/19, remains on senna and miralax Osteoporosis- DEXA results not in Epic, Prolia injections q 6 months Depression/anxiety- no panic attacks, remains on zoloft Rheumatoid arthritis- denies pain today, remains on methotrexate weekly, tylenol qid Anemia- hgb 10.9 08/28/2021, vit B12 1010 24/0973, remains on folic acid    Past Medical History:  Diagnosis Date   Acid reflux disease    Arthritis    rheumatoid   Atrial fibrillation (HCC)    Back pain    Bursitis of hip, right    Cancer (HCC)    skin   Chest pain    Confusion    Constipation    Fuchs' corneal dystrophy    GERD (gastroesophageal reflux disease)    Hard of hearing    Knee pain    Memory loss    Paranoia (HCC)    Shoulder pain    Venous insufficiency    Past Surgical History:  Procedure Laterality Date   CORNEAL TRANSPLANT  2012   Dr Maudie Mercury    RECONSTRUCTION OF EYELID     eyelid cancer 10 years ago Dr Victorino Dike    RECONSTRUCTION OF NOSE     nose cancer/ Dr  Goldman 10 years ago     Allergies  Allergen Reactions   Novocain [Procaine] Other (See Comments)    Unknown reaction   Sulfamethoxazole-Trimethoprim Other (See Comments)    Unknown reaction   Tape Other (See Comments)    Adhesive tape - unknown reaction   Codeine Other (See Comments)    Unknown reaction   Neosporin [Neomycin-Bacitracin Zn-Polymyx] Other (See Comments)    Unknown reaction    Allergies as of 09/09/2021       Reactions   Novocain [procaine] Other (See Comments)    Unknown reaction   Sulfamethoxazole-trimethoprim Other (See Comments)   Unknown reaction   Tape Other (See Comments)   Adhesive tape - unknown reaction   Codeine Other (See Comments)   Unknown reaction   Neosporin [neomycin-bacitracin Zn-polymyx] Other (See Comments)   Unknown reaction        Medication List        Accurate as of September 09, 2021  2:20 PM. If you have any questions, ask your nurse or doctor.          acetaminophen 325 MG tablet Commonly known as: TYLENOL Take 650 mg by mouth in the morning, at noon, in the evening, and at bedtime.   apixaban 2.5 MG Tabs tablet Commonly known as: ELIQUIS Take 2.5 mg by mouth 2 (two) times daily.   denosumab 60 MG/ML Sosy injection Commonly known as: PROLIA Inject 60 mg into the skin every 6 (six) months. On the 22nd of reach 6th month   fluorouracil 5 % cream Commonly known as: EFUDEX Apply topically 2 (two) times daily.   folic acid 1 MG tablet Commonly known as: FOLVITE Take 1 mg by mouth daily.   lactose free nutrition Liqd Take 237 mLs by mouth daily.   Lidocaine 4 % Ptch Apply topically. Apply to lower back Q 24hrs Once A Day   memantine 10 MG tablet Commonly known as: NAMENDA Take 10 mg by mouth 2 (two) times daily.   methotrexate 2.5 MG tablet Commonly known as: RHEUMATREX Take 15 mg by mouth every Friday. Caution:Chemotherapy. Protect from light.   mineral oil-hydrophilic petrolatum ointment Apply 1 application topically 2 (two) times daily as needed for dry skin. Apply to forehead, legs, and right mid back   Multiple Vitamin-Folic Acid Tabs Take 1 tablet by mouth daily. + B12   polyethylene glycol 17 g packet Commonly known as: MIRALAX / GLYCOLAX Take 17 g by mouth every other day.   pravastatin 20 MG tablet Commonly known as: PRAVACHOL Take 20 mg by mouth at bedtime.   prednisoLONE acetate 1 % ophthalmic suspension Commonly known as: PRED FORTE Place 1 drop into the right eye  daily.   QUEtiapine 25 MG tablet Commonly known as: SEROQUEL Take 12.5 mg by mouth 2 (two) times daily.   senna-docusate 8.6-50 MG tablet Commonly known as: Senokot-S Take 2 tablets by mouth at bedtime.   sertraline 50 MG tablet Commonly known as: ZOLOFT Take 50 mg by mouth daily.   Systane 0.4-0.3 % Soln Generic drug: Polyethyl Glycol-Propyl Glycol Place 1 drop into both eyes in the morning and at bedtime.        Review of Systems  Constitutional:  Negative for activity change, appetite change, chills, fatigue and fever.  HENT:  Negative for congestion, hearing loss and trouble swallowing.   Eyes:  Negative for visual disturbance.  Respiratory:  Negative for cough, shortness of breath and wheezing.   Cardiovascular:  Negative for chest pain and  leg swelling.  Gastrointestinal:  Negative for abdominal distention, abdominal pain, blood in stool, constipation, diarrhea, nausea and vomiting.  Genitourinary:  Negative for dysuria, frequency and hematuria.  Musculoskeletal:  Positive for gait problem. Negative for arthralgias and myalgias.  Skin: Negative.   Neurological:  Negative for dizziness, weakness and headaches.  Psychiatric/Behavioral:  Positive for agitation, behavioral problems and confusion. Negative for dysphoric mood and sleep disturbance. The patient is not nervous/anxious.    Immunization History  Administered Date(s) Administered   Influenza, High Dose Seasonal PF 10/02/2013, 08/25/2015, 09/03/2016, 09/05/2017, 09/12/2018, 08/25/2019   Influenza-Unspecified 10/01/2014, 09/03/2020   Moderna SARS-COV2 Booster Vaccination 04/21/2021   Moderna Sars-Covid-2 Vaccination 11/24/2019, 12/22/2019, 09/30/2020   Pneumococcal Conjugate-13 10/21/2014   Pneumococcal Polysaccharide-23 01/23/2018   Td 04/18/2013   Zoster, Live 09/04/2012   Pertinent  Health Maintenance Due  Topic Date Due   DEXA SCAN  Never done   INFLUENZA VACCINE  06/22/2021   No flowsheet data  found. Functional Status Survey:    Vitals:   09/09/21 1410  BP: 130/84  Pulse: 84  Resp: 20  Temp: 98.5 F (36.9 C)  SpO2: 95%  Weight: 151 lb 12.8 oz (68.9 kg)  Height: 5\' 6"  (1.676 m)   Body mass index is 24.5 kg/m. Physical Exam Vitals reviewed.  Constitutional:      General: She is not in acute distress. HENT:     Head: Normocephalic.  Eyes:     General:        Right eye: No discharge.        Left eye: No discharge.  Neck:     Vascular: No carotid bruit.  Cardiovascular:     Rate and Rhythm: Normal rate. Rhythm irregular.     Pulses: Normal pulses.     Heart sounds: Normal heart sounds. No murmur heard. Pulmonary:     Effort: Pulmonary effort is normal. No respiratory distress.     Breath sounds: Normal breath sounds. No wheezing.  Abdominal:     General: Bowel sounds are normal. There is no distension.     Palpations: Abdomen is soft.     Tenderness: There is no abdominal tenderness.  Musculoskeletal:     Cervical back: Normal range of motion.     Right lower leg: No edema.     Left lower leg: No edema.  Lymphadenopathy:     Cervical: No cervical adenopathy.  Skin:    General: Skin is warm and dry.     Capillary Refill: Capillary refill takes less than 2 seconds.  Neurological:     General: No focal deficit present.     Mental Status: She is alert. Mental status is at baseline.     Motor: Weakness present.     Gait: Gait abnormal.     Comments: wheelchair  Psychiatric:        Mood and Affect: Mood normal.        Behavior: Behavior normal.        Cognition and Memory: Memory is impaired.     Comments: Very pleasant, not inappropriate during our encounter, followed commands    Labs reviewed: Recent Labs    05/28/21 0000 07/10/21 0000 08/28/21 0000  NA 141 142 139  K 4.3 4.2 4.5  CL 104 105 102  CO2 31* 29* 31*  BUN 21 20 17   CREATININE 1.0 1.0 1.0  CALCIUM 10.3 10.2 9.8   Recent Labs    05/28/21 0000 07/10/21 0000 08/28/21 0000  AST  15 15  15  ALT 7 6* 9  ALKPHOS 45 55 75  ALBUMIN 3.7 3.3* 4.1   Recent Labs    05/28/21 0000 07/10/21 0000 08/28/21 0000  WBC 6.1 5.9 5.7  NEUTROABS 2,946.00 2,614.00 2,827.00  HGB 11.5* 10.9* 12.0  HCT 35* 34* 36  PLT 220 186 286   Lab Results  Component Value Date   TSH 3.70 07/10/2021   No results found for: HGBA1C Lab Results  Component Value Date   CHOL 156 01/08/2021   HDL 34 (A) 01/08/2021   LDLCALC 90 01/08/2021   TRIG 220 (A) 01/08/2021    Significant Diagnostic Results in last 30 days:  No results found.  Assessment/Plan 1. Vascular dementia with behavior disturbance - behaviors of kissing and inappropriately touching staff - seroquel reduced to 12.5 mg po qhs due to EPS symptoms? - unclear how long episodes of eye twitching and arm jerking lasted, reported < 1 hr and never observed again - discussed with pharmacist medication side effects - will increase Seroquel to 12.5 mg po bid - advised staff to report any repetitive or abnormal movements  2. PAF (paroxysmal atrial fibrillation) (HCC) - rate controlled without medication - cont Eliquis for clot prevention  3. Hyperlipidemia, unspecified hyperlipidemia type - cont statin  4. Slow transit constipation - abdomen soft - LBM 10/19 - cont senna and miralax  5. Osteoporosis without current pathological fracture, unspecified osteoporosis type - cont Prolia injections q6 months  6. Depression with anxiety - cont zoloft  7. Rheumatoid arthritis, involving unspecified site, unspecified whether rheumatoid factor present (HCC) - cont methotrexate  8. Anemia, chronic disease - hgb stable - cont folic acid    Family/ staff Communication: plan discussed with patient and nurse  Labs/tests ordered:  none

## 2021-09-22 ENCOUNTER — Non-Acute Institutional Stay (SKILLED_NURSING_FACILITY): Payer: Medicare Other | Admitting: Internal Medicine

## 2021-09-22 ENCOUNTER — Encounter: Payer: Self-pay | Admitting: Internal Medicine

## 2021-09-22 DIAGNOSIS — M810 Age-related osteoporosis without current pathological fracture: Secondary | ICD-10-CM | POA: Diagnosis not present

## 2021-09-22 DIAGNOSIS — F418 Other specified anxiety disorders: Secondary | ICD-10-CM | POA: Diagnosis not present

## 2021-09-22 DIAGNOSIS — F01518 Vascular dementia, unspecified severity, with other behavioral disturbance: Secondary | ICD-10-CM

## 2021-09-22 DIAGNOSIS — I480 Paroxysmal atrial fibrillation: Secondary | ICD-10-CM

## 2021-09-22 DIAGNOSIS — E785 Hyperlipidemia, unspecified: Secondary | ICD-10-CM | POA: Diagnosis not present

## 2021-09-22 DIAGNOSIS — M069 Rheumatoid arthritis, unspecified: Secondary | ICD-10-CM

## 2021-09-22 DIAGNOSIS — D638 Anemia in other chronic diseases classified elsewhere: Secondary | ICD-10-CM

## 2021-09-22 NOTE — Progress Notes (Signed)
Location:   [facility] Room Number: 50 Place of Service:  SNF [phone]) Provider:  Veleta Miners MD  Virgie Dad, MD  Patient Care Team: Virgie Dad, MD as PCP - General (Internal Medicine) Pichardo-Geisinger, Mila Palmer, MD as Referring Physician Nestor Ramp Effie Shy, MD as Referring Physician (Ophthalmology)  Extended Emergency Contact Information Primary Emergency Contact: Key, Tim Address: [address]          [place], [place] [address] [name] of [place] Phone: [phone] Relation: [name] Secondary Emergency Contact: Owens Shark Address: [address]          [place], [place] [address] [name] of [place] Phone: [phone] Mobile Phone: [phone] Relation: Other  Code Status:  Full Code Goals of care: Advanced Directive information Advanced Directives 09/22/2021  Does Patient Have a Medical Advance Directive? Yes  Type of Paramedic of [place];Living will  Does patient want to make changes to medical advance directive? No - Patient declined  Copy of [place] in Chart? Yes - validated most recent copy scanned in chart (See row information)  Would patient like information on creating a medical advance directive? -  Pre-existing out of facility DNR order (yellow form or pink MOST form) -     Chief Complaint  Patient presents with   Medical Management of Chronic Issues   Quality Metric Gaps    Shingrix, Dexa scan, #5 Covid vaccine, flu shot  Per [facility] patient received a dose of PCV13 at age 91 Years or older. Administer a follow-up dose of PPSV23 after an interval of 1 year or longer following the dose of PCV13.    HPI:  Pt is a 87 y.o. female seen today for medical management of chronic diseases.    Patient has a history of PAF on Eliquis, GERD, Rheumatoid arthritis, Hyperlipidemia, Osteoporosis and Dementia with Anxiety and Behavior issues   nondisplaced  fracture of L5 with after sustaining a mechanical fall Has been in SNF since then  Doing well Has lost some weight but more near her Baseline No Behavior issue since Seroquel added again Did not  tolerate the Taper Pleasantly Confused No Acute issues today    Past Medical History:  Diagnosis Date   Acid reflux disease    Arthritis    rheumatoid   Atrial fibrillation (HCC)    Back pain    Bursitis of hip, right    Cancer (HCC)    skin   Chest pain    Confusion    Constipation    Fuchs' corneal dystrophy    GERD (gastroesophageal reflux disease)    Hard of hearing    Knee pain    Memory loss    Paranoia (HCC)    Shoulder pain    Venous insufficiency    Past Surgical History:  Procedure Laterality Date   CORNEAL TRANSPLANT  2012   Dr Maudie Mercury    RECONSTRUCTION OF EYELID     eyelid cancer 10 years ago Dr Victorino Dike    RECONSTRUCTION OF NOSE     nose cancer/ Dr Nyoka Cowden 10 years ago     Allergies  Allergen Reactions   Novocain [Procaine] Other (See Comments)    Unknown reaction   Sulfamethoxazole-Trimethoprim Other (See Comments)    Unknown reaction   Tape Other (See Comments)    Adhesive tape - unknown reaction   Codeine Other (See Comments)    Unknown reaction   Neosporin [Neomycin-Bacitracin Zn-Polymyx] Other (See  Comments)    Unknown reaction    Allergies as of 09/22/2021       Reactions   Novocain [procaine] Other (See Comments)   Unknown reaction   Sulfamethoxazole-trimethoprim Other (See Comments)   Unknown reaction   Tape Other (See Comments)   Adhesive tape - unknown reaction   Codeine Other (See Comments)   Unknown reaction   Neosporin [neomycin-bacitracin Zn-polymyx] Other (See Comments)   Unknown reaction        Medication List        Accurate as of September 22, 2021 11:49 AM. If you have any questions, ask your nurse or doctor.          STOP taking these medications    senna-docusate 8.6-50 MG tablet Commonly known as:  Senokot-S Stopped by: Virgie Dad, MD       TAKE these medications    acetaminophen 325 MG tablet Commonly known as: TYLENOL Take 650 mg by mouth in the morning, at noon, in the evening, and at bedtime.   apixaban 2.5 MG Tabs tablet Commonly known as: ELIQUIS Take 2.5 mg by mouth 2 (two) times daily.   denosumab 60 MG/ML Sosy injection Commonly known as: PROLIA Inject 60 mg into the skin every 6 (six) months. On the 22nd of reach 6th month   fluorouracil 5 % cream Commonly known as: EFUDEX Apply topically 2 (two) times daily.   folic acid 1 MG tablet Commonly known as: FOLVITE Take 1 mg by mouth daily.   lactose free nutrition Liqd Take 237 mLs by mouth daily.   Lidocaine 4 % Ptch Apply topically. Apply to lower back Q 24hrs Once A Day   memantine 10 MG tablet Commonly known as: NAMENDA Take 10 mg by mouth 2 (two) times daily.   methotrexate 2.5 MG tablet Commonly known as: RHEUMATREX Take 15 mg by mouth every Friday. Caution:Chemotherapy. Protect from light.   mineral oil-hydrophilic petrolatum ointment Apply 1 application topically 2 (two) times daily as needed for dry skin. Apply to forehead, legs, and right mid back   Multiple Vitamin-Folic Acid Tabs Take 1 tablet by mouth daily. + B12   polyethylene glycol 17 g packet Commonly known as: MIRALAX / GLYCOLAX Take 17 g by mouth every other day.   pravastatin 20 MG tablet Commonly known as: PRAVACHOL Take 20 mg by mouth at bedtime.   prednisoLONE acetate 1 % ophthalmic suspension Commonly known as: PRED FORTE Place 1 drop into the right eye daily.   QUEtiapine 25 MG tablet Commonly known as: SEROQUEL Take 12.5 mg by mouth 2 (two) times daily.   sertraline 50 MG tablet Commonly known as: ZOLOFT Take 50 mg by mouth daily.   Systane 0.4-0.3 % Soln Generic drug: Polyethyl Glycol-Propyl Glycol Place 1 drop into both eyes in the morning and at bedtime.        Review of Systems  Unable to  perform ROS: Dementia   Immunization History  Administered Date(s) Administered   Influenza, High Dose Seasonal PF 10/02/2013, 08/25/2015, 09/03/2016, 09/05/2017, 09/12/2018, 08/25/2019   Influenza-Unspecified 10/01/2014, 09/03/2020   Moderna SARS-COV2 Booster Vaccination 04/21/2021   Moderna Sars-Covid-2 Vaccination 11/24/2019, 12/22/2019, 09/30/2020   Pneumococcal Conjugate-13 10/21/2014   Pneumococcal Polysaccharide-23 01/23/2018   Td 04/18/2013   Zoster, Live 09/04/2012   Pertinent  Health Maintenance Due  Topic Date Due   DEXA SCAN  Never done   INFLUENZA VACCINE  06/22/2021   Fall Risk 07/22/2020 07/22/2020 07/23/2020 07/23/2020 07/24/2020  Patient Fall Risk Level  High fall risk High fall risk High fall risk High fall risk High fall risk   Functional Status Survey:    Vitals:   09/22/21 1143  BP: 105/64  Pulse: 66  Resp: 20  Temp: (!) 97.5 F (36.4 C)  SpO2: 96%  Weight: 151 lb 12.8 oz (68.9 kg)  Height: 5\' 6"  (1.676 m)   Body mass index is 24.5 kg/m. Physical Exam Vitals reviewed.  Constitutional:      Appearance: Normal appearance.  HENT:     Head: Normocephalic.     Nose: Nose normal.     Mouth/Throat:     Mouth: Mucous membranes are moist.     Pharynx: Oropharynx is clear.  Eyes:     Pupils: Pupils are equal, round, and reactive to light.  Cardiovascular:     Rate and Rhythm: Normal rate and regular rhythm.     Pulses: Normal pulses.     Heart sounds: Normal heart sounds.  Pulmonary:     Effort: Pulmonary effort is normal.     Breath sounds: Normal breath sounds.  Abdominal:     General: Abdomen is flat. Bowel sounds are normal.     Palpations: Abdomen is soft.  Musculoskeletal:        General: No swelling.     Cervical back: Neck supple.  Skin:    General: Skin is warm.  Neurological:     General: No focal deficit present.     Mental Status: She is alert.  Psychiatric:        Mood and Affect: Mood normal.        Thought Content: Thought content  normal.    Labs reviewed: Recent Labs    05/28/21 0000 07/10/21 0000 08/28/21 0000  NA 141 142 139  K 4.3 4.2 4.5  CL 104 105 102  CO2 31* 29* 31*  BUN 21 20 17   CREATININE 1.0 1.0 1.0  CALCIUM 10.3 10.2 9.8   Recent Labs    05/28/21 0000 07/10/21 0000 08/28/21 0000  AST 15 15 15   ALT 7 6* 9  ALKPHOS 45 55 75  ALBUMIN 3.7 3.3* 4.1   Recent Labs    05/28/21 0000 07/10/21 0000 08/28/21 0000  WBC 6.1 5.9 5.7  NEUTROABS 2,946.00 2,614.00 2,827.00  HGB 11.5* 10.9* 12.0  HCT 35* 34* 36  PLT 220 186 286   Lab Results  Component Value Date   TSH 3.70 07/10/2021   No results found for: HGBA1C Lab Results  Component Value Date   CHOL 156 01/08/2021   HDL 34 (A) 01/08/2021   LDLCALC 90 01/08/2021   TRIG 220 (A) 01/08/2021    Significant Diagnostic Results in last 30 days:  No results found.  Assessment/Plan  PAF (paroxysmal atrial fibrillation) (HCC) Continue Eliquis Hyperlipidemia, unspecified hyperlipidemia type On statin LDL less then 100 in 2/22 Depression with anxiety Continue Same dose of Zoloft Osteoporosis without current pathological fracture, unspecified osteoporosis type On Prolia by Outside Provider Rheumatoid arthritis, involving unspecified site, unspecified whether rheumatoid factor present ([place]) Continue Methotrexate and stable Anemia, chronic disease Hgb Normal Vascular dementia with behavior disturbance On Seroquel and Namenda for behavior controls  Family/ staff Communication:   Labs/tests ordered:

## 2021-11-09 ENCOUNTER — Non-Acute Institutional Stay (SKILLED_NURSING_FACILITY): Payer: Medicare Other | Admitting: Nurse Practitioner

## 2021-11-09 ENCOUNTER — Encounter: Payer: Self-pay | Admitting: Nurse Practitioner

## 2021-11-09 DIAGNOSIS — M810 Age-related osteoporosis without current pathological fracture: Secondary | ICD-10-CM | POA: Diagnosis not present

## 2021-11-09 DIAGNOSIS — K5901 Slow transit constipation: Secondary | ICD-10-CM

## 2021-11-09 DIAGNOSIS — E785 Hyperlipidemia, unspecified: Secondary | ICD-10-CM

## 2021-11-09 DIAGNOSIS — F01C11 Vascular dementia, severe, with agitation: Secondary | ICD-10-CM

## 2021-11-09 DIAGNOSIS — D638 Anemia in other chronic diseases classified elsewhere: Secondary | ICD-10-CM | POA: Diagnosis not present

## 2021-11-09 DIAGNOSIS — S32050D Wedge compression fracture of fifth lumbar vertebra, subsequent encounter for fracture with routine healing: Secondary | ICD-10-CM

## 2021-11-09 DIAGNOSIS — I480 Paroxysmal atrial fibrillation: Secondary | ICD-10-CM

## 2021-11-09 DIAGNOSIS — M069 Rheumatoid arthritis, unspecified: Secondary | ICD-10-CM | POA: Diagnosis not present

## 2021-11-09 DIAGNOSIS — F418 Other specified anxiety disorders: Secondary | ICD-10-CM

## 2021-11-09 NOTE — Assessment & Plan Note (Signed)
L5 sustained from fall, 07/19/20, takes Tylenol, w/c for mobility.

## 2021-11-09 NOTE — Assessment & Plan Note (Signed)
Her mood is stable,  takes Sertraline, Quetiapine,  off Buspar 09/26/20

## 2021-11-09 NOTE — Assessment & Plan Note (Signed)
takes Methotrexate 15mg wkly, also takes Tylenol qid.   

## 2021-11-09 NOTE — Progress Notes (Signed)
Location:   SNF [facility] Room Number: 49 Place of Service:  SNF (31) Provider: [facility] Loneta Tamplin NP  Virgie Dad, MD  Patient Care Team: Virgie Dad, MD as PCP - General (Internal Medicine) Pichardo-Geisinger, Mila Palmer, MD as Referring Physician Nestor Ramp, Effie Shy, MD as Referring Physician (Ophthalmology)  Extended Emergency Contact Information Primary Emergency Contact: Key, Tim Address: [address]          [place], [place] [address] [name] of [place] Phone: [phone] Relation: Nephew Secondary Emergency Contact: Owens Shark Address: [address]          [place], [place] [address] [name] of [place] Phone: [phone] Mobile Phone: [phone] Relation: Other  Code Status:  DNR Goals of care: Advanced Directive information Advanced Directives 11/09/2021  Does Patient Have a Medical Advance Directive? Yes  Type of Paramedic of [place];Living will  Does patient want to make changes to medical advance directive? No - Patient declined  Copy of [place] in Chart? Yes - validated most recent copy scanned in chart (See row information)  Would patient like information on creating a medical advance directive? -  Pre-existing out of facility DNR order (yellow form or pink MOST form) -     Chief Complaint  Patient presents with   Medical Management of Chronic Issues    Routine follow up visit.   Health Maintenance    Zoster vaccine,dexa scan, 2nd COVID vaccine, flu vaccine.    HPI:  Pt is a 87 y.o. female seen today for medical management of chronic diseases.    Gait abnormality, uses w/c for mobility             L5 sustained from fall, 07/19/20, takes Tylenol.               Constipation, takes Senna, MiraLax               Dementia,  takes Memantine             Afib, takes Eliquis, TSH 3.70 07/10/21             Depression/anxiety, takes Sertraline, Quetiapine,  off Buspar 09/26/20               RA, takes Methotrexate 15mg  wkly, also takes Tylenol qid.              OP, takes Prolia, Ca, Vit D                  Anemia, Fe 66, Vit B12 1010 9/67/89, takes Folic acid, Hgb 12 38/1/01             Hyperlipidemia, takes Pravastatin, LDL 90 01/08/21    Past Medical History:  Diagnosis Date   Acid reflux disease    Arthritis    rheumatoid   Atrial fibrillation (HCC)    Back pain    Bursitis of hip, right    Cancer (HCC)    skin   Chest pain    Confusion    Constipation    Fuchs' corneal dystrophy    GERD (gastroesophageal reflux disease)    Hard of hearing    Knee pain    Memory loss    Paranoia (HCC)    Shoulder pain    Venous insufficiency    Past Surgical History:  Procedure Laterality Date   CORNEAL TRANSPLANT  2012   Dr Maudie Mercury    RECONSTRUCTION OF EYELID  eyelid cancer 10 years ago Dr Victorino Dike    RECONSTRUCTION OF NOSE     nose cancer/ Dr Nyoka Cowden 10 years ago     Allergies  Allergen Reactions   Novocain [Procaine] Other (See Comments)    Unknown reaction   Sulfamethoxazole-Trimethoprim Other (See Comments)    Unknown reaction   Tape Other (See Comments)    Adhesive tape - unknown reaction   Codeine Other (See Comments)    Unknown reaction   Neosporin [Neomycin-Bacitracin Zn-Polymyx] Other (See Comments)    Unknown reaction    Allergies as of 11/09/2021       Reactions   Novocain [procaine] Other (See Comments)   Unknown reaction   Sulfamethoxazole-trimethoprim Other (See Comments)   Unknown reaction   Tape Other (See Comments)   Adhesive tape - unknown reaction   Codeine Other (See Comments)   Unknown reaction   Neosporin [neomycin-bacitracin Zn-polymyx] Other (See Comments)   Unknown reaction        Medication List        Accurate as of November 09, 2021 11:59 PM. If you have any questions, ask your nurse or doctor.          acetaminophen 325 MG tablet Commonly known as: TYLENOL Take 650 mg by mouth in the morning, at  noon, in the evening, and at bedtime.   apixaban 2.5 MG Tabs tablet Commonly known as: ELIQUIS Take 2.5 mg by mouth 2 (two) times daily.   denosumab 60 MG/ML Sosy injection Commonly known as: PROLIA Inject 60 mg into the skin every 6 (six) months. On the 22nd of reach 6th month   fluorouracil 5 % cream Commonly known as: EFUDEX Apply topically 2 (two) times daily.   folic acid 1 MG tablet Commonly known as: FOLVITE Take 1 mg by mouth daily.   lactose free nutrition Liqd Take 237 mLs by mouth daily.   Lidocaine 4 % Ptch Apply topically. Apply to lower back Q 24hrs Once A Day   memantine 10 MG tablet Commonly known as: NAMENDA Take 10 mg by mouth 2 (two) times daily.   methotrexate 2.5 MG tablet Commonly known as: RHEUMATREX Take 15 mg by mouth every Friday. Caution:Chemotherapy. Protect from light.   mineral oil-hydrophilic petrolatum ointment Apply 1 application topically 2 (two) times daily as needed for dry skin. Apply to forehead, legs, and right mid back   Multiple Vitamin-Folic Acid Tabs Take 1 tablet by mouth daily. + B12   polyethylene glycol 17 g packet Commonly known as: MIRALAX / GLYCOLAX Take 17 g by mouth every other day.   pravastatin 20 MG tablet Commonly known as: PRAVACHOL Take 20 mg by mouth at bedtime.   prednisoLONE acetate 1 % ophthalmic suspension Commonly known as: PRED FORTE Place 1 drop into the right eye daily.   QUEtiapine 25 MG tablet Commonly known as: SEROQUEL Take 12.5 mg by mouth 2 (two) times daily.   senna 8.6 MG tablet Commonly known as: SENOKOT Take 1 tablet by mouth at bedtime.   sertraline 50 MG tablet Commonly known as: ZOLOFT Take 50 mg by mouth daily.   Systane 0.4-0.3 % Soln Generic drug: Polyethyl Glycol-Propyl Glycol Place 1 drop into both eyes in the morning and at bedtime.        Review of Systems  Unable to perform ROS: Dementia   Immunization History  Administered Date(s) Administered    Influenza, High Dose Seasonal PF 10/02/2013, 08/25/2015, 09/03/2016, 09/05/2017, 09/12/2018, 08/25/2019   Influenza-Unspecified 10/01/2014, 09/03/2020  Moderna SARS-COV2 Booster Vaccination 04/21/2021   Moderna Sars-Covid-2 Vaccination 11/24/2019, 12/22/2019, 09/30/2020, 04/21/2021   Pneumococcal Conjugate-13 10/21/2014   Pneumococcal Polysaccharide-23 01/23/2018   Td 04/18/2013   Unspecified SARS-COV-2 Vaccination 11/04/2021   Zoster, Live 09/04/2012   Pertinent  Health Maintenance Due  Topic Date Due   DEXA SCAN  Never done   INFLUENZA VACCINE  06/22/2021   Fall Risk 07/22/2020 07/22/2020 07/23/2020 07/23/2020 07/24/2020  Patient Fall Risk Level High fall risk High fall risk High fall risk High fall risk High fall risk   Functional Status Survey:    Vitals:   11/09/21 1128  BP: 126/80  Pulse: 72  Resp: 16  Temp: 98 F (36.7 C)  SpO2: 97%  Weight: 151 lb 12.8 oz (68.9 kg)  Height: 5\' 6"  (1.676 m)   Body mass index is 24.5 kg/m. Physical Exam Vitals and nursing note reviewed.  Constitutional:      Appearance: Normal appearance.  HENT:     Head: Normocephalic and atraumatic.  Eyes:     Extraocular Movements: Extraocular movements intact.     Conjunctiva/sclera: Conjunctivae normal.     Pupils: Pupils are equal, round, and reactive to light.  Cardiovascular:     Rate and Rhythm: Normal rate and regular rhythm.  Pulmonary:     Effort: Pulmonary effort is normal.     Breath sounds: No rales.  Abdominal:     General: Bowel sounds are normal.     Palpations: Abdomen is soft.     Tenderness: There is no abdominal tenderness.     Hernia: A hernia is present.     Comments: Right inguinal hernia, reducible, a tennis ball sized from previous examination.   Musculoskeletal:     Cervical back: Normal range of motion and neck supple.     Right lower leg: No edema.     Left lower leg: No edema.     Comments: Lower back pain is better.   Skin:    General: Skin is warm and dry.      Comments: A small skin tear lateral left lower leg, no s/s of infection.   Neurological:     General: No focal deficit present.     Mental Status: She is alert. Mental status is at baseline.     Gait: Gait abnormal.     Comments: Oriented to person, place.   Psychiatric:        Mood and Affect: Mood normal.        Behavior: Behavior normal.     Comments: Pleasantly confused.     Labs reviewed: Recent Labs    05/28/21 0000 07/10/21 0000 08/28/21 0000  NA 141 142 139  K 4.3 4.2 4.5  CL 104 105 102  CO2 31* 29* 31*  BUN 21 20 17   CREATININE 1.0 1.0 1.0  CALCIUM 10.3 10.2 9.8   Recent Labs    05/28/21 0000 07/10/21 0000 08/28/21 0000  AST 15 15 15   ALT 7 6* 9  ALKPHOS 45 55 75  ALBUMIN 3.7 3.3* 4.1   Recent Labs    05/28/21 0000 07/10/21 0000 08/28/21 0000  WBC 6.1 5.9 5.7  NEUTROABS 2,946.00 2,614.00 2,827.00  HGB 11.5* 10.9* 12.0  HCT 35* 34* 36  PLT 220 186 286   Lab Results  Component Value Date   TSH 3.70 07/10/2021   No results found for: HGBA1C Lab Results  Component Value Date   CHOL 156 01/08/2021   HDL 34 (A) 01/08/2021  [place] 90 01/08/2021   TRIG 220 (A) 01/08/2021    Significant Diagnostic Results in last 30 days:  No results found.  Assessment/Plan  Anemia, chronic disease Fe 66, Vit B12 1010 9/47/09, takes Folic acid, Hgb 12 62/8/36  Hyperlipidemia  takes Pravastatin, LDL 90 01/08/21    Osteoporosis  takes Prolia, Ca, Vit D     Rheumatoid arthritis ([place])  takes Methotrexate 15mg  wkly, also takes Tylenol qid.   Depression with anxiety Her mood is stable,  takes Sertraline, Quetiapine,  off Buspar 09/26/20   PAF (paroxysmal atrial fibrillation) (HCC) Heart rate is in control, continue Eliquis, TSH 3.70 07/10/21  Vascular dementia ([place])   takes Memantine, resides SNF [facility] for supportive care.   Slow transit constipation Stable,  takes Senna, MiraLax   L5 vertebral fracture ([place]) L5 sustained from fall, 07/19/20,  takes Tylenol, w/c for mobility.    Family/ staff Communication: plan of care reviewed with the patient and charge nurse.   Labs/tests ordered:  none  Time spend 35 minutes.

## 2021-11-09 NOTE — Assessment & Plan Note (Signed)
Heart rate is in control, continue Eliquis, TSH 3.70 07/10/21

## 2021-11-09 NOTE — Assessment & Plan Note (Signed)
Stable, takes Senna, MiraLax.  

## 2021-11-09 NOTE — Assessment & Plan Note (Signed)
takes Prolia, Ca, Vit D 

## 2021-11-09 NOTE — Assessment & Plan Note (Signed)
Fe 66, Vit B12 1010 3/49/17, takes Folic acid, Hgb 12 91/5/05

## 2021-11-09 NOTE — Assessment & Plan Note (Signed)
takes Memantine, resides SNF [facility] for supportive care.

## 2021-11-09 NOTE — Assessment & Plan Note (Signed)
takes Pravastatin, LDL 90 01/08/21

## 2021-11-10 ENCOUNTER — Encounter: Payer: Self-pay | Admitting: Nurse Practitioner

## 2021-11-24 ENCOUNTER — Encounter: Payer: Self-pay | Admitting: Nurse Practitioner

## 2021-11-24 ENCOUNTER — Non-Acute Institutional Stay (SKILLED_NURSING_FACILITY): Payer: Medicare Other | Admitting: Nurse Practitioner

## 2021-11-24 DIAGNOSIS — I480 Paroxysmal atrial fibrillation: Secondary | ICD-10-CM

## 2021-11-24 DIAGNOSIS — E785 Hyperlipidemia, unspecified: Secondary | ICD-10-CM

## 2021-11-24 DIAGNOSIS — F418 Other specified anxiety disorders: Secondary | ICD-10-CM

## 2021-11-24 DIAGNOSIS — K5901 Slow transit constipation: Secondary | ICD-10-CM

## 2021-11-24 DIAGNOSIS — S32050D Wedge compression fracture of fifth lumbar vertebra, subsequent encounter for fracture with routine healing: Secondary | ICD-10-CM

## 2021-11-24 DIAGNOSIS — M069 Rheumatoid arthritis, unspecified: Secondary | ICD-10-CM | POA: Diagnosis not present

## 2021-11-24 DIAGNOSIS — F01C11 Vascular dementia, severe, with agitation: Secondary | ICD-10-CM

## 2021-11-24 DIAGNOSIS — R269 Unspecified abnormalities of gait and mobility: Secondary | ICD-10-CM

## 2021-11-24 DIAGNOSIS — D638 Anemia in other chronic diseases classified elsewhere: Secondary | ICD-10-CM

## 2021-11-24 DIAGNOSIS — M810 Age-related osteoporosis without current pathological fracture: Secondary | ICD-10-CM

## 2021-11-24 DIAGNOSIS — I959 Hypotension, unspecified: Secondary | ICD-10-CM

## 2021-11-24 NOTE — Progress Notes (Signed)
Location:   [facility] Room Number: 50 Place of Service:  SNF (31) Provider:  Barclay Lennox, Lennie Odor NP  Virgie Dad, MD  Patient Care Team: Virgie Dad, MD as PCP - General (Internal Medicine) Pichardo-Geisinger, Mila Palmer, MD as Referring Physician Nestor Ramp Effie Shy, MD as Referring Physician (Ophthalmology)  Extended Emergency Contact Information Primary Emergency Contact: Key, Tim Address: [address]          [place], [place] [address] [name] of [place] Phone: [phone] Relation: [name] Secondary Emergency Contact: Owens Shark Address: [address]          [place], [place] [address] [name] of [place] Phone: [phone] Mobile Phone: [phone] Relation: Other  Code Status:  Full Code Goals of care: Advanced Directive information Advanced Directives 11/24/2021  Does Patient Have a Medical Advance Directive? Yes  Type of Paramedic of [place];Living will  Does patient want to make changes to medical advance directive? No - Patient declined  Copy of [place] in Chart? Yes - validated most recent copy scanned in chart (See row information)  Would patient like information on creating a medical advance directive? -  Pre-existing out of facility DNR order (yellow form or pink MOST form) -     Chief Complaint  Patient presents with   Medical Management of Chronic Issues   Quality Metric Gaps    Zoster Vaccines- Shingrix (1 of 2)   DEXA SCAN       HPI:  Pt is a 88 y.o. female seen today for medical management of chronic diseases.       Gait abnormality, uses w/c for mobility             L5 sustained from fall, 07/19/20, takes Tylenol.               Constipation, takes Senna, MiraLax               Dementia,  takes Memantine             Afib, takes Eliquis, TSH 3.70 07/10/21             Depression/anxiety, takes Sertraline, Quetiapine,  off Buspar 09/26/20               RA, takes Methotrexate 15mg  wkly, also takes Tylenol qid.              OP, takes Prolia, Ca, Vit D                  Anemia, Fe 66, Vit B12 1010 0/96/28, takes Folic acid, Hgb 12 36/6/29             Hyperlipidemia, takes Pravastatin, LDL 90 01/08/21  Past Medical History:  Diagnosis Date   Acid reflux disease    Arthritis    rheumatoid   Atrial fibrillation (HCC)    Back pain    Bursitis of hip, right    Cancer (HCC)    skin   Chest pain    Confusion    Constipation    Fuchs' corneal dystrophy    GERD (gastroesophageal reflux disease)    Hard of hearing    Knee pain    Memory loss    Paranoia (HCC)    Shoulder pain    Venous insufficiency    Past Surgical History:  Procedure Laterality Date   CORNEAL TRANSPLANT  2012   Dr Maudie Mercury    RECONSTRUCTION OF EYELID  eyelid cancer 10 years ago Dr Victorino Dike    RECONSTRUCTION OF NOSE     nose cancer/ Dr Nyoka Cowden 10 years ago     Allergies  Allergen Reactions   Novocain [Procaine] Other (See Comments)    Unknown reaction   Sulfamethoxazole-Trimethoprim Other (See Comments)    Unknown reaction   Tape Other (See Comments)    Adhesive tape - unknown reaction   Codeine Other (See Comments)    Unknown reaction   Neosporin [Neomycin-Bacitracin Zn-Polymyx] Other (See Comments)    Unknown reaction    Allergies as of 11/24/2021       Reactions   Novocain [procaine] Other (See Comments)   Unknown reaction   Sulfamethoxazole-trimethoprim Other (See Comments)   Unknown reaction   Tape Other (See Comments)   Adhesive tape - unknown reaction   Codeine Other (See Comments)   Unknown reaction   Neosporin [neomycin-bacitracin Zn-polymyx] Other (See Comments)   Unknown reaction        Medication List        Accurate as of November 24, 2021 11:59 PM. If you have any questions, ask your nurse or doctor.          acetaminophen 325 MG tablet Commonly known as: TYLENOL Take 650 mg by mouth in the morning, at noon, in the  evening, and at bedtime.   apixaban 2.5 MG Tabs tablet Commonly known as: ELIQUIS Take 2.5 mg by mouth 2 (two) times daily.   denosumab 60 MG/ML Sosy injection Commonly known as: PROLIA Inject 60 mg into the skin every 6 (six) months. On the 22nd of reach 6th month   fluorouracil 5 % cream Commonly known as: EFUDEX Apply topically 2 (two) times daily.   folic acid 1 MG tablet Commonly known as: FOLVITE Take 1 mg by mouth daily.   lactose free nutrition Liqd Take 237 mLs by mouth daily.   Lidocaine 4 % Ptch Apply topically. Apply to lower back Q 24hrs Once A Day   memantine 10 MG tablet Commonly known as: NAMENDA Take 10 mg by mouth 2 (two) times daily.   methotrexate 2.5 MG tablet Commonly known as: RHEUMATREX Take 15 mg by mouth every Friday. Caution:Chemotherapy. Protect from light.   mineral oil-hydrophilic petrolatum ointment Apply 1 application topically 2 (two) times daily as needed for dry skin. Apply to forehead, legs, and right mid back   Multiple Vitamin-Folic Acid Tabs Take 1 tablet by mouth daily. + B12   polyethylene glycol 17 g packet Commonly known as: MIRALAX / GLYCOLAX Take 17 g by mouth every other day.   pravastatin 20 MG tablet Commonly known as: PRAVACHOL Take 20 mg by mouth at bedtime.   prednisoLONE acetate 1 % ophthalmic suspension Commonly known as: PRED FORTE Place 1 drop into the right eye daily.   QUEtiapine 25 MG tablet Commonly known as: SEROQUEL Take 12.5 mg by mouth 2 (two) times daily.   senna 8.6 MG tablet Commonly known as: SENOKOT Take 1 tablet by mouth at bedtime.   sertraline 50 MG tablet Commonly known as: ZOLOFT Take 50 mg by mouth daily.   Systane 0.4-0.3 % Soln Generic drug: Polyethyl Glycol-Propyl Glycol Place 1 drop into both eyes in the morning and at bedtime.        Review of Systems  Unable to perform ROS: Dementia   Immunization History  Administered Date(s) Administered   Influenza, High Dose  Seasonal PF 10/02/2013, 08/25/2015, 09/03/2016, 09/05/2017, 09/12/2018, 08/25/2019   Influenza-Unspecified 10/01/2014, 09/03/2020, 09/09/2021  Moderna SARS-COV2 Booster Vaccination 04/21/2021   Moderna Sars-Covid-2 Vaccination 11/24/2019, 12/22/2019, 09/30/2020, 04/21/2021   Pneumococcal Conjugate-13 09/30/2014, 10/21/2014   Pneumococcal Polysaccharide-23 01/23/2018   Td 04/18/2013   Unspecified SARS-COV-2 Vaccination 11/04/2021   Zoster, Live 09/04/2012   Pertinent  Health Maintenance Due  Topic Date Due   DEXA SCAN  Never done   INFLUENZA VACCINE  Completed   Fall Risk 07/22/2020 07/22/2020 07/23/2020 07/23/2020 07/24/2020  Patient Fall Risk Level High fall risk High fall risk High fall risk High fall risk High fall risk   Functional Status Survey:    Vitals:   11/24/21 1059  BP: (!) 108/50  Pulse: 74  Resp: 18  Temp: (!) 97 F (36.1 C)  SpO2: 93%  Weight: 158 lb 6.4 oz (71.8 kg)  Height: 5\' 6"  (1.676 m)   Body mass index is 25.57 kg/m. Physical Exam Vitals and nursing note reviewed.  Constitutional:      Appearance: Normal appearance.  HENT:     Head: Normocephalic and atraumatic.  Eyes:     Extraocular Movements: Extraocular movements intact.     Conjunctiva/sclera: Conjunctivae normal.     Pupils: Pupils are equal, round, and reactive to light.  Cardiovascular:     Rate and Rhythm: Normal rate and regular rhythm.  Pulmonary:     Effort: Pulmonary effort is normal.     Breath sounds: No rales.  Abdominal:     General: Bowel sounds are normal.     Palpations: Abdomen is soft.     Tenderness: There is no abdominal tenderness.     Comments: Right inguinal hernia, reducible, a tennis ball sized from previous examination.   Musculoskeletal:     Cervical back: Normal range of motion and neck supple.     Right lower leg: No edema.     Left lower leg: No edema.     Comments: Lower back pain is better.   Skin:    General: Skin is warm and dry.     Comments: A small  skin tear lateral left lower leg, no s/s of infection.   Neurological:     General: No focal deficit present.     Mental Status: She is alert. Mental status is at baseline.     Gait: Gait abnormal.     Comments: Oriented to person, place.   Psychiatric:        Mood and Affect: Mood normal.        Behavior: Behavior normal.     Comments: Pleasantly confused.     Labs reviewed: Recent Labs    05/28/21 0000 07/10/21 0000 08/28/21 0000  NA 141 142 139  K 4.3 4.2 4.5  CL 104 105 102  CO2 31* 29* 31*  BUN 21 20 17   CREATININE 1.0 1.0 1.0  CALCIUM 10.3 10.2 9.8   Recent Labs    05/28/21 0000 07/10/21 0000 08/28/21 0000  AST 15 15 15   ALT 7 6* 9  ALKPHOS 45 55 75  ALBUMIN 3.7 3.3* 4.1   Recent Labs    05/28/21 0000 07/10/21 0000 08/28/21 0000  WBC 6.1 5.9 5.7  NEUTROABS 2,946.00 2,614.00 2,827.00  HGB 11.5* 10.9* 12.0  HCT 35* 34* 36  PLT 220 186 286   Lab Results  Component Value Date   TSH 3.70 07/10/2021   No results found for: HGBA1C Lab Results  Component Value Date   CHOL 156 01/08/2021   HDL 34 (A) 01/08/2021   LDLCALC 90 01/08/2021   TRIG  220 (A) 01/08/2021    Significant Diagnostic Results in last 30 days:  No results found.  Assessment/Plan Anemia, chronic disease Fe 66, Vit B12 1010 04/28/36, takes Folic acid, Hgb 12 08/27/25  Hyperlipidemia takes Pravastatin, LDL 90 01/08/21  Osteoporosis takes Prolia, Ca, Vit D       Rheumatoid arthritis ([place]) takes Methotrexate 15mg  wkly, also takes Tylenol qid.   Depression with anxiety Her mood is stable, continue Sertraline, Quetiapine,  off Buspar 09/26/20   PAF (paroxysmal atrial fibrillation) (HCC) Heart rate is in control, takes Eliquis, TSH 3.70 07/10/21  Vascular dementia ([place]) takes Memantine  Slow transit constipation Stable, takes Senna, MiraLax   L5 vertebral fracture ([place])  L5 sustained from fall, 07/19/20, takes Tylenol.    Gait abnormality uses w/c for mobility                Hypotension No noted generalized weakness, dizziness, chest pain, palpitation. Will monitor VS qd x 5 days.      Family/ staff Communication: plan of care reviewed with the patient and charge nurse.   Labs/tests ordered:  none  Time spend 35 minutes.

## 2021-11-26 ENCOUNTER — Encounter: Payer: Self-pay | Admitting: Nurse Practitioner

## 2021-11-26 NOTE — Assessment & Plan Note (Signed)
Her mood is stable, continue Sertraline, Quetiapine,  off Buspar 09/26/20

## 2021-11-26 NOTE — Assessment & Plan Note (Signed)
takes Pravastatin, LDL 90 01/08/21

## 2021-11-26 NOTE — Assessment & Plan Note (Signed)
L5 sustained from fall, 07/19/20, takes Tylenol.

## 2021-11-26 NOTE — Assessment & Plan Note (Signed)
Fe 66, Vit B12 1010 9/79/48, takes Folic acid, Hgb 12 11/27/53

## 2021-11-26 NOTE — Assessment & Plan Note (Signed)
takes Prolia, Ca, Vit D 

## 2021-11-26 NOTE — Assessment & Plan Note (Signed)
Stable, takes Senna, MiraLax.  

## 2021-11-26 NOTE — Assessment & Plan Note (Signed)
Heart rate is in control, takes Eliquis, TSH 3.70 07/10/21

## 2021-11-26 NOTE — Assessment & Plan Note (Signed)
uses w/c for mobility

## 2021-11-26 NOTE — Assessment & Plan Note (Signed)
takes Memantine

## 2021-11-26 NOTE — Assessment & Plan Note (Signed)
takes Methotrexate 15mg wkly, also takes Tylenol qid.   

## 2021-11-30 ENCOUNTER — Encounter: Payer: Self-pay | Admitting: Nurse Practitioner

## 2021-11-30 DIAGNOSIS — I959 Hypotension, unspecified: Secondary | ICD-10-CM | POA: Insufficient documentation

## 2021-11-30 NOTE — Assessment & Plan Note (Signed)
No noted generalized weakness, dizziness, chest pain, palpitation. Will monitor VS qd x 5 days.

## 2021-12-29 ENCOUNTER — Encounter: Payer: Self-pay | Admitting: Internal Medicine

## 2021-12-29 ENCOUNTER — Non-Acute Institutional Stay (SKILLED_NURSING_FACILITY): Payer: Medicare Other | Admitting: Internal Medicine

## 2021-12-29 DIAGNOSIS — E785 Hyperlipidemia, unspecified: Secondary | ICD-10-CM

## 2021-12-29 DIAGNOSIS — M069 Rheumatoid arthritis, unspecified: Secondary | ICD-10-CM

## 2021-12-29 DIAGNOSIS — F418 Other specified anxiety disorders: Secondary | ICD-10-CM

## 2021-12-29 DIAGNOSIS — I480 Paroxysmal atrial fibrillation: Secondary | ICD-10-CM

## 2021-12-29 DIAGNOSIS — M810 Age-related osteoporosis without current pathological fracture: Secondary | ICD-10-CM | POA: Diagnosis not present

## 2021-12-29 DIAGNOSIS — F01C11 Vascular dementia, severe, with agitation: Secondary | ICD-10-CM

## 2021-12-29 NOTE — Progress Notes (Signed)
Location:   [facility] Room Number: 50 Place of Service:  SNF [id number]) Provider:  Veleta Miners MD  Virgie Dad, MD  Patient Care Team: Virgie Dad, MD as PCP - General (Internal Medicine) Pichardo-Geisinger, Mila Palmer, MD as Referring Physician Nestor Ramp Effie Shy, MD as Referring Physician (Ophthalmology)  Extended Emergency Contact Information Primary Emergency Contact: Key, Tim Address: [address]          [place], [place] [address] [name] of [place] Phone: [phone] Relation: [name] Secondary Emergency Contact: Owens Shark Address: [address]          [place],  [address] [name] of [place] Phone: [phone] Mobile Phone: [phone] Relation: Other  Code Status:  Full Code Goals of care: Advanced Directive information Advanced Directives 12/29/2021  Does Patient Have a Medical Advance Directive? Yes  Type of Paramedic of [employer];Living will  Does patient want to make changes to medical advance directive? No - Patient declined  Copy of [place] in Chart? Yes - validated most recent copy scanned in chart (See row information)  Would patient like information on creating a medical advance directive? -  Pre-existing out of facility DNR order (yellow form or pink MOST form) -     Chief Complaint  Patient presents with   Medical Management of Chronic Issues   Quality Metric Gaps    Shingrix Dexa scan [facility] verified    HPI:  Pt is a 88 y.o. female seen today for medical management of chronic diseases.     Patient has a history of PAF on Eliquis, GERD, Rheumatoid arthritis, Hyperlipidemia, Osteoporosis and Dementia with Anxiety and Behavior issues  Lives in SNF     She is stable. No new Nursing issues. No Behavior issues Her weight is stable Wheelchair Dependent No Falls  Wt Readings from Last 3 Encounters:  12/29/21 162 lb 4.8  oz (73.6 kg)  11/24/21 158 lb 6.4 oz (71.8 kg)  11/09/21 151 lb 12.8 oz (68.9 kg)    Past Medical History:  Diagnosis Date   Acid reflux disease    Arthritis    rheumatoid   Atrial fibrillation (HCC)    Back pain    Bursitis of hip, right    Cancer (HCC)    skin   Chest pain    Confusion    Constipation    Fuchs' corneal dystrophy    GERD (gastroesophageal reflux disease)    Hard of hearing    Knee pain    Memory loss    Paranoia (HCC)    Shoulder pain    Venous insufficiency    Past Surgical History:  Procedure Laterality Date   CORNEAL TRANSPLANT  2012   Dr Maudie Mercury    RECONSTRUCTION OF EYELID     eyelid cancer 10 years ago Dr Victorino Dike    RECONSTRUCTION OF NOSE     nose cancer/ Dr Nyoka Cowden 10 years ago     Allergies  Allergen Reactions   Novocain [Procaine] Other (See Comments)    Unknown reaction   Sulfamethoxazole-Trimethoprim Other (See Comments)    Unknown reaction   Tape Other (See Comments)    Adhesive tape - unknown reaction   Codeine Other (See Comments)    Unknown reaction   Neosporin [Neomycin-Bacitracin Zn-Polymyx] Other (See Comments)    Unknown reaction    Allergies as of 12/29/2021       Reactions   Novocain [procaine] Other (See Comments)  Unknown reaction   Sulfamethoxazole-trimethoprim Other (See Comments)   Unknown reaction   Tape Other (See Comments)   Adhesive tape - unknown reaction   Codeine Other (See Comments)   Unknown reaction   Neosporin [neomycin-bacitracin Zn-polymyx] Other (See Comments)   Unknown reaction        Medication List        Accurate as of December 29, 2021  2:50 PM. If you have any questions, ask your nurse or doctor.          acetaminophen 325 MG tablet Commonly known as: TYLENOL Take 650 mg by mouth in the morning, at noon, in the evening, and at bedtime.   apixaban 2.5 MG Tabs tablet Commonly known as: ELIQUIS Take 2.5 mg by mouth 2 (two) times daily.   denosumab 60 MG/ML Sosy  injection Commonly known as: PROLIA Inject 60 mg into the skin every 6 (six) months. On the 22nd of reach 6th month   fluorouracil 5 % cream Commonly known as: EFUDEX Apply topically 2 (two) times daily.   folic acid 1 MG tablet Commonly known as: FOLVITE Take 1 mg by mouth daily.   lactose free nutrition Liqd Take 237 mLs by mouth daily.   Lidocaine 4 % Ptch Apply topically. Apply to lower back Q 24hrs Once A Day   memantine 10 MG tablet Commonly known as: NAMENDA Take 10 mg by mouth 2 (two) times daily.   methotrexate 2.5 MG tablet Commonly known as: RHEUMATREX Take 15 mg by mouth every Friday. Caution:Chemotherapy. Protect from light.   mineral oil-hydrophilic petrolatum ointment Apply 1 application topically 2 (two) times daily as needed for dry skin. Apply to forehead, legs, and right mid back   Multiple Vitamin-Folic Acid Tabs Take 1 tablet by mouth daily. + B12   polyethylene glycol 17 g packet Commonly known as: MIRALAX / GLYCOLAX Take 17 g by mouth every other day.   pravastatin 20 MG tablet Commonly known as: PRAVACHOL Take 20 mg by mouth at bedtime.   prednisoLONE acetate 1 % ophthalmic suspension Commonly known as: PRED FORTE Place 1 drop into the right eye daily.   QUEtiapine 25 MG tablet Commonly known as: SEROQUEL Take 12.5 mg by mouth 2 (two) times daily.   senna 8.6 MG tablet Commonly known as: SENOKOT Take 1 tablet by mouth at bedtime.   sertraline 50 MG tablet Commonly known as: ZOLOFT Take 50 mg by mouth daily.   Systane 0.4-0.3 % Soln Generic drug: Polyethyl Glycol-Propyl Glycol Place 1 drop into both eyes in the morning and at bedtime.        Review of Systems  Unable to perform ROS: Dementia   Immunization History  Administered Date(s) Administered   Influenza, High Dose Seasonal PF 10/02/2013, 08/25/2015, 09/03/2016, 09/05/2017, 09/12/2018, 08/25/2019   Influenza-Unspecified 10/01/2014, 09/03/2020, 09/09/2021   Moderna  SARS-COV2 Booster Vaccination 04/21/2021   Moderna Sars-Covid-2 Vaccination 11/24/2019, 12/22/2019, 09/30/2020, 04/21/2021   Pneumococcal Conjugate-13 09/30/2014, 10/21/2014   Pneumococcal Polysaccharide-23 01/23/2018   Td 04/18/2013   Unspecified SARS-COV-2 Vaccination 11/04/2021   Zoster, Live 09/04/2012   Pertinent  Health Maintenance Due  Topic Date Due   DEXA SCAN  Never done   INFLUENZA VACCINE  Completed   Fall Risk 07/22/2020 07/22/2020 07/23/2020 07/23/2020 07/24/2020  Patient Fall Risk Level High fall risk High fall risk High fall risk High fall risk High fall risk   Functional Status Survey:    Vitals:   12/29/21 1445  BP: 122/72  Pulse: 72  Resp: 20  Temp: 97.8 F (36.6 C)  SpO2: 93%  Weight: 162 lb 4.8 oz (73.6 kg)  Height: 5\' 6"  (1.676 m)   Body mass index is 26.2 kg/m. Physical Exam Vitals reviewed.  Constitutional:      Appearance: Normal appearance.  HENT:     Head: Normocephalic.     Nose: Nose normal.     Mouth/Throat:     Mouth: Mucous membranes are moist.     Pharynx: Oropharynx is clear.  Eyes:     Pupils: Pupils are equal, round, and reactive to light.  Cardiovascular:     Rate and Rhythm: Normal rate and regular rhythm.     Pulses: Normal pulses.     Heart sounds: Normal heart sounds. No murmur heard. Pulmonary:     Effort: Pulmonary effort is normal.     Breath sounds: Normal breath sounds.  Abdominal:     General: Abdomen is flat. Bowel sounds are normal.     Palpations: Abdomen is soft.  Musculoskeletal:        General: No swelling.     Cervical back: Neck supple.  Skin:    General: Skin is warm.  Neurological:     General: No focal deficit present.     Mental Status: She is alert.  Psychiatric:        Mood and Affect: Mood normal.        Thought Content: Thought content normal.    Labs reviewed: Recent Labs    05/28/21 0000 07/10/21 0000 08/28/21 0000  NA 141 142 139  K 4.3 4.2 4.5  CL 104 105 102  CO2 31* 29* 31*  BUN  21 20 17   CREATININE 1.0 1.0 1.0  CALCIUM 10.3 10.2 9.8   Recent Labs    05/28/21 0000 07/10/21 0000 08/28/21 0000  AST 15 15 15   ALT 7 6* 9  ALKPHOS 45 55 75  ALBUMIN 3.7 3.3* 4.1   Recent Labs    05/28/21 0000 07/10/21 0000 08/28/21 0000  WBC 6.1 5.9 5.7  NEUTROABS 2,946.00 2,614.00 2,827.00  HGB 11.5* 10.9* 12.0  HCT 35* 34* 36  PLT 220 186 286   Lab Results  Component Value Date   TSH 3.70 07/10/2021   No results found for: HGBA1C Lab Results  Component Value Date   CHOL 156 01/08/2021   HDL 34 (A) 01/08/2021   LDLCALC 90 01/08/2021   TRIG 220 (A) 01/08/2021    Significant Diagnostic Results in last 30 days:  No results found.  Assessment/Plan 1. Osteoporosis without current pathological fracture, unspecified osteoporosis type Prolia  Can repeat  DEXA in Facility  2. Hyperlipidemia, unspecified hyperlipidemia type On statin LDL 90 in 22  3. Rheumatoid arthritis, involving unspecified site, unspecified whether rheumatoid factor present ([place]) On methotrexate Labs normal in 10/22  4. Depression with anxiety On Zoloft  5. PAF (paroxysmal atrial fibrillation) (HCC) Continue Eliquis  6. Severe vascular dementia with agitation Continue Seroquel and Namenda    Family/ staff Communication:   Labs/tests ordered:

## 2022-01-25 ENCOUNTER — Encounter: Payer: Self-pay | Admitting: Nurse Practitioner

## 2022-01-25 ENCOUNTER — Non-Acute Institutional Stay (SKILLED_NURSING_FACILITY): Payer: Medicare Other | Admitting: Nurse Practitioner

## 2022-01-25 DIAGNOSIS — D638 Anemia in other chronic diseases classified elsewhere: Secondary | ICD-10-CM

## 2022-01-25 DIAGNOSIS — M810 Age-related osteoporosis without current pathological fracture: Secondary | ICD-10-CM

## 2022-01-25 DIAGNOSIS — R296 Repeated falls: Secondary | ICD-10-CM

## 2022-01-25 DIAGNOSIS — E785 Hyperlipidemia, unspecified: Secondary | ICD-10-CM

## 2022-01-25 DIAGNOSIS — K5901 Slow transit constipation: Secondary | ICD-10-CM

## 2022-01-25 DIAGNOSIS — M069 Rheumatoid arthritis, unspecified: Secondary | ICD-10-CM

## 2022-01-25 DIAGNOSIS — F01C11 Vascular dementia, severe, with agitation: Secondary | ICD-10-CM

## 2022-01-25 DIAGNOSIS — I480 Paroxysmal atrial fibrillation: Secondary | ICD-10-CM

## 2022-01-25 DIAGNOSIS — F418 Other specified anxiety disorders: Secondary | ICD-10-CM | POA: Diagnosis not present

## 2022-01-25 NOTE — Assessment & Plan Note (Signed)
No behavioral issues, continue supportive care in SNF [facility], takes Memantine ?

## 2022-01-25 NOTE — Assessment & Plan Note (Signed)
Heart rate is in control, takes Eliquis, TSH 3.70 07/10/21 ?

## 2022-01-25 NOTE — Assessment & Plan Note (Signed)
Her mood is stable, takes Sertraline, Quetiapine,  off Buspar 09/26/20  ?

## 2022-01-25 NOTE — Assessment & Plan Note (Signed)
Risk of falling, uses w/c for mobility. Hx of L5 fx sustained from fall, 07/19/20, takes Tylenol.   ?

## 2022-01-25 NOTE — Progress Notes (Signed)
Location:   [facility] Room Number: [place] of Service:  SNF (31) Provider:  Leng Montesdeoca, NP    Patient Care Team: Virgie Dad, MD as PCP - General (Internal Medicine) Pichardo-Geisinger, Mila Palmer, MD as Referring Physician Nestor Ramp, Effie Shy, MD as Referring Physician (Ophthalmology)  Extended Emergency Contact Information Primary Emergency Contact: Key, Tim Address: [address]          [place], [place] [address] [name] of [place] Phone: [phone] Relation: Nephew Secondary Emergency Contact: Owens Shark Address: [address]          [place], [place] [address] [name] of [place] Phone: [phone] Mobile Phone: [phone] Relation: Other  Code Status:  FULL CODE Goals of care: Advanced Directive information Advanced Directives 01/26/2022  Does Patient Have a Medical Advance Directive? Yes  Type of Paramedic of [place];Living will  Does patient want to make changes to medical advance directive? No - Patient declined  Copy of [place] in Chart? Yes - validated most recent copy scanned in chart (See row information)  Would patient like information on creating a medical advance directive? -  Pre-existing out of facility DNR order (yellow form or pink MOST form) -     Chief Complaint  Patient presents with   Medical Management of Chronic Issues    Routine Visit.    Health Maintenance    Discuss the need for Dexa Scan.    Immunizations    Discuss the need for Shingrix vaccine.    HPI:  Pt is a 88 y.o. female seen today for medical management of chronic diseases.       Gait abnormality, uses w/c for mobility             L5 fx sustained from fall, 07/19/20, takes Tylenol.               Constipation, takes Senna, MiraLax               Dementia,  takes Memantine             Afib, takes Eliquis, TSH 3.70 07/10/21             Depression/anxiety, takes  Sertraline, Quetiapine,  off Buspar 09/26/20              RA, takes Methotrexate '15mg'$  wkly, also takes Tylenol qid.              OP, takes Prolia, Ca, Vit D, due DEXA              Anemia, Fe 66, Vit B12 1010 9/44/96, takes Folic acid, Hgb 12 75/9/16             Hyperlipidemia, takes Pravastatin, LDL 90 01/08/21  Past Medical History:  Diagnosis Date   Acid reflux disease    Arthritis    rheumatoid   Atrial fibrillation (HCC)    Back pain    Bursitis of hip, right    Cancer (HCC)    skin   Chest pain    Confusion    Constipation    Fuchs' corneal dystrophy    GERD (gastroesophageal reflux disease)    Hard of hearing    Knee pain    Memory loss    Paranoia (HCC)    Shoulder pain    Venous insufficiency    Past Surgical History:  Procedure Laterality Date   CORNEAL TRANSPLANT  2012   Dr  Kim    RECONSTRUCTION OF EYELID     eyelid cancer 10 years ago Dr Victorino Dike    RECONSTRUCTION OF NOSE     nose cancer/ Dr Nyoka Cowden 10 years ago     Allergies  Allergen Reactions   Novocain [Procaine] Other (See Comments)    Unknown reaction   Sulfamethoxazole-Trimethoprim Other (See Comments)    Unknown reaction   Tape Other (See Comments)    Adhesive tape - unknown reaction   Codeine Other (See Comments)    Unknown reaction   Neosporin [Neomycin-Bacitracin Zn-Polymyx] Other (See Comments)    Unknown reaction    Allergies as of 01/25/2022       Reactions   Novocain [procaine] Other (See Comments)   Unknown reaction   Sulfamethoxazole-trimethoprim Other (See Comments)   Unknown reaction   Tape Other (See Comments)   Adhesive tape - unknown reaction   Codeine Other (See Comments)   Unknown reaction   Neosporin [neomycin-bacitracin Zn-polymyx] Other (See Comments)   Unknown reaction        Medication List        Accurate as of January 25, 2022 11:59 PM. If you have any questions, ask your nurse or doctor.          STOP taking these medications    fluorouracil 5 %  cream Commonly known as: EFUDEX Stopped by: Erin Uecker X Lutricia Widjaja, NP       TAKE these medications    acetaminophen 325 MG tablet Commonly known as: TYLENOL Take 650 mg by mouth every 6 (six) hours as needed.   apixaban 2.5 MG Tabs tablet Commonly known as: ELIQUIS Take 2.5 mg by mouth 2 (two) times daily.   Aquaphor Adv Protect Healing 41 % Oint Apply 1 application topically daily. What changed: Another medication with the same name was removed. Continue taking this medication, and follow the directions you see here. Changed by: Kartier Bennison X Larnell Granlund, NP   denosumab 60 MG/ML Sosy injection Commonly known as: PROLIA Inject 60 mg into the skin every 6 (six) months. On the 22nd of reach 6th month   folic acid 1 MG tablet Commonly known as: FOLVITE Take 1 mg by mouth daily.   lactose free nutrition Liqd Take 237 mLs by mouth daily.   Lidocaine 4 % Ptch Apply topically. Apply to lower back Q 24hrs Once A Day   memantine 10 MG tablet Commonly known as: NAMENDA Take 10 mg by mouth 2 (two) times daily.   methotrexate 2.5 MG tablet Commonly known as: RHEUMATREX Take 15 mg by mouth every Friday. Caution:Chemotherapy. Protect from light.   Multiple Vitamin-Folic Acid Tabs Take 1 tablet by mouth daily. + B12   polyethylene glycol 17 g packet Commonly known as: MIRALAX / GLYCOLAX Take 17 g by mouth every other day.   pravastatin 20 MG tablet Commonly known as: PRAVACHOL Take 20 mg by mouth at bedtime.   prednisoLONE acetate 1 % ophthalmic suspension Commonly known as: PRED FORTE Place 1 drop into the right eye daily.   QUEtiapine 25 MG tablet Commonly known as: SEROQUEL Take 12.5 mg by mouth 2 (two) times daily.   senna 8.6 MG tablet Commonly known as: SENOKOT Take 1 tablet by mouth at bedtime.   sertraline 50 MG tablet Commonly known as: ZOLOFT Take 50 mg by mouth daily.   Systane 0.4-0.3 % Soln Generic drug: Polyethyl Glycol-Propyl Glycol Place 1 drop into both eyes in the  morning and at bedtime.  Review of Systems  Unable to perform ROS: Dementia   Immunization History  Administered Date(s) Administered   Influenza, High Dose Seasonal PF 10/02/2013, 08/25/2015, 09/03/2016, 09/05/2017, 09/12/2018, 08/25/2019   Influenza-Unspecified 10/01/2014, 09/03/2020, 09/09/2021   Moderna SARS-COV2 Booster Vaccination 04/21/2021   Moderna Sars-Covid-2 Vaccination 11/24/2019, 12/22/2019, 09/30/2020, 04/21/2021   Pneumococcal Conjugate-13 09/30/2014, 10/21/2014   Pneumococcal Polysaccharide-23 01/23/2018   Td 04/18/2013   Unspecified SARS-COV-2 Vaccination 11/04/2021   Zoster, Live 09/04/2012   Pertinent  Health Maintenance Due  Topic Date Due   DEXA SCAN  Never done   INFLUENZA VACCINE  Completed   Fall Risk 07/22/2020 07/22/2020 07/23/2020 07/23/2020 07/24/2020  Patient Fall Risk Level High fall risk High fall risk High fall risk High fall risk High fall risk   Functional Status Survey:    Vitals:   01/25/22 0849  BP: 120/60  Pulse: 70  Resp: 20  Temp: 98.2 F (36.8 C)  SpO2: 91%  Weight: 164 lb 8 oz (74.6 kg)  Height: '5\' 6"'$  (1.676 m)   Body mass index is 26.55 kg/m. Physical Exam Vitals and nursing note reviewed.  Constitutional:      Appearance: Normal appearance.  HENT:     Head: Normocephalic and atraumatic.  Eyes:     Extraocular Movements: Extraocular movements intact.     Conjunctiva/sclera: Conjunctivae normal.     Pupils: Pupils are equal, round, and reactive to light.  Cardiovascular:     Rate and Rhythm: Normal rate and regular rhythm.  Pulmonary:     Effort: Pulmonary effort is normal.     Breath sounds: No rales.  Abdominal:     General: Bowel sounds are normal.     Palpations: Abdomen is soft.     Tenderness: There is no abdominal tenderness.     Comments: Right inguinal hernia, reducible, a tennis ball sized from previous examination.   Musculoskeletal:     Cervical back: Normal range of motion and neck supple.      Right lower leg: No edema.     Left lower leg: No edema.     Comments: Lower back pain is better.   Skin:    General: Skin is warm and dry.  Neurological:     General: No focal deficit present.     Mental Status: She is alert. Mental status is at baseline.     Gait: Gait abnormal.     Comments: Oriented to person, place.   Psychiatric:        Mood and Affect: Mood normal.        Behavior: Behavior normal.     Comments: Pleasantly confused.     Labs reviewed: Recent Labs    05/28/21 0000 07/10/21 0000 08/28/21 0000  NA 141 142 139  K 4.3 4.2 4.5  CL 104 105 102  CO2 31* 29* 31*  BUN '21 20 17  '$ CREATININE 1.0 1.0 1.0  CALCIUM 10.3 10.2 9.8   Recent Labs    05/28/21 0000 07/10/21 0000 08/28/21 0000  AST '15 15 15  '$ ALT 7 6* 9  ALKPHOS 45 55 75  ALBUMIN 3.7 3.3* 4.1   Recent Labs    05/28/21 0000 07/10/21 0000 08/28/21 0000  WBC 6.1 5.9 5.7  NEUTROABS 2,946.00 2,614.00 2,827.00  HGB 11.5* 10.9* 12.0  HCT 35* 34* 36  PLT 220 186 286   Lab Results  Component Value Date   TSH 3.70 07/10/2021   No results found for: HGBA1C Lab Results  Component Value Date  CHOL 156 01/08/2021   HDL 34 (A) 01/08/2021   LDLCALC 90 01/08/2021   TRIG 220 (A) 01/08/2021    Significant Diagnostic Results in last 30 days:  No results found.  Assessment/Plan PAF (paroxysmal atrial fibrillation) (HCC) Heart rate is in control, takes Eliquis, TSH 3.70 07/10/21  Depression with anxiety Her mood is stable, takes Sertraline, Quetiapine,  off Buspar 09/26/20   Rheumatoid arthritis ([place]) takes Methotrexate '15mg'$  wkly, also takes Tylenol qid.  Osteoporosis  takes Prolia, Ca, Vit D, DEXA if HPOA consents.   Anemia, chronic disease Fe 66, Vit B12 1010 1/91/47, takes Folic acid, Hgb 12 82/9/56  Hyperlipidemia takes Pravastatin, LDL 90 01/08/21  Vascular dementia (HCC) No behavioral issues, continue supportive care in SNF [facility], takes Memantine  Slow transit constipation Stable,   takes Senna, MiraLax   Frequent falls  Risk of falling, uses w/c for mobility. Hx of L5 fx sustained from fall, 07/19/20, takes Tylenol.       Family/ staff Communication: plan of care reviewed with the patient and charge nurse.   Labs/tests ordered:  none  Time spend 35 minutes.

## 2022-01-25 NOTE — Assessment & Plan Note (Signed)
Fe 66, Vit B12 1010 01/18/32, takes Folic acid, Hgb 12 35/4/56 ?

## 2022-01-25 NOTE — Assessment & Plan Note (Signed)
takes Methotrexate 15mg wkly, also takes Tylenol qid.   

## 2022-01-25 NOTE — Assessment & Plan Note (Signed)
Stable, takes Senna, MiraLax.  

## 2022-01-25 NOTE — Assessment & Plan Note (Signed)
takes Prolia, Ca, Vit D, DEXA if HPOA consents.  ?

## 2022-01-25 NOTE — Assessment & Plan Note (Signed)
takes Pravastatin, LDL 90 01/08/21 ?

## 2022-01-26 ENCOUNTER — Encounter: Payer: Self-pay | Admitting: Nurse Practitioner

## 2022-01-26 ENCOUNTER — Non-Acute Institutional Stay (SKILLED_NURSING_FACILITY): Payer: Medicare Other | Admitting: Nurse Practitioner

## 2022-01-26 DIAGNOSIS — Z0000 Encounter for general adult medical examination without abnormal findings: Secondary | ICD-10-CM

## 2022-01-26 NOTE — Progress Notes (Signed)
Subjective:   Adrienne [name] is a 88 y.o. female who presents for Medicare Annual (Subsequent) preventive examination at SNF [facility]     Objective:    Today's Vitals   01/26/22 1023 01/26/22 1523  BP: 115/64   Pulse: 71   Resp: 20   Temp: 98 F (36.7 C)   SpO2: 96%   Weight: 164 lb 8 oz (74.6 kg)   Height: '5\' 6"'$  (1.676 m)   PainSc:  2    Body mass index is 26.55 kg/m.  Advanced Directives 01/26/2022 01/25/2022 12/29/2021 11/24/2021 11/09/2021 09/22/2021 09/09/2021  Does Patient Have a Medical Advance Directive? Yes Yes Yes Yes Yes Yes Yes  Type of Paramedic of [place];Living will [place];Living will [place];Living will [place];Living will [place];Living will [place];Living will [place];Living will  Does patient want to make changes to medical advance directive? No - Patient declined No - Patient declined No - Patient declined No - Patient declined No - Patient declined No - Patient declined No - Patient declined  Copy of [place] in Chart? Yes - validated most recent copy scanned in chart (See row information) Yes - validated most recent copy scanned in chart (See row information) Yes - validated most recent copy scanned in chart (See row information) Yes - validated most recent copy scanned in chart (See row information) Yes - validated most recent copy scanned in chart (See row information) Yes - validated most recent copy scanned in chart (See row information) Yes - validated most recent copy scanned in chart (See row information)  Would patient like information on creating a medical advance directive? - - - - - - -  Pre-existing out of facility DNR order (yellow form or pink MOST form) - - - - - - -    Current Medications (verified) Outpatient Encounter Medications as of 01/26/2022  Medication Sig   acetaminophen  (TYLENOL) 325 MG tablet Take 650 mg by mouth every 6 (six) hours as needed.   apixaban (ELIQUIS) 2.5 MG TABS tablet Take 2.5 mg by mouth 2 (two) times daily.   denosumab (PROLIA) 60 MG/ML SOSY injection Inject 60 mg into the skin every 6 (six) months. On the 22nd of reach 6th month   Emollient (AQUAPHOR ADV PROTECT HEALING) 41 % OINT Apply 1 application topically daily.   folic acid (FOLVITE) 1 MG tablet Take 1 mg by mouth daily.   lactose free nutrition (BOOST PLUS) LIQD Take 237 mLs by mouth daily.   Lidocaine 4 % PTCH Apply topically. Apply to lower back Q 24hrs Once A Day   memantine (NAMENDA) 10 MG tablet Take 10 mg by mouth 2 (two) times daily.    methotrexate (RHEUMATREX) 2.5 MG tablet Take 15 mg by mouth every Friday. Caution:Chemotherapy. Protect from light.   Multiple Vitamin-Folic Acid TABS Take 1 tablet by mouth daily. + B12   Polyethyl Glycol-Propyl Glycol (SYSTANE) 0.4-0.3 % SOLN Place 1 drop into both eyes in the morning and at bedtime.    polyethylene glycol (MIRALAX / GLYCOLAX) 17 g packet Take 17 g by mouth every other day.   pravastatin (PRAVACHOL) 20 MG tablet Take 20 mg by mouth at bedtime.    prednisoLONE acetate (PRED FORTE) 1 % ophthalmic suspension Place 1 drop into the right eye daily.   QUEtiapine (SEROQUEL) 25 MG tablet Take 12.5 mg by mouth 2 (two) times daily.  senna (SENOKOT) 8.6 MG tablet Take 1 tablet by mouth at bedtime.   sertraline (ZOLOFT) 50 MG tablet Take 50 mg by mouth daily.    No facility-administered encounter medications on file as of 01/26/2022.    Allergies (verified) Novocain [procaine], Sulfamethoxazole-trimethoprim, Tape, Codeine, and Neosporin [neomycin-bacitracin zn-polymyx]   History: Past Medical History:  Diagnosis Date   Acid reflux disease    Arthritis    rheumatoid   Atrial fibrillation (HCC)    Back pain    Bursitis of hip, right    Cancer (HCC)    skin   Chest pain    Confusion    Constipation    Fuchs' corneal dystrophy     GERD (gastroesophageal reflux disease)    Hard of hearing    Knee pain    Memory loss    Paranoia (HCC)    Shoulder pain    Venous insufficiency    Past Surgical History:  Procedure Laterality Date   CORNEAL TRANSPLANT  2012   Dr Maudie Mercury    RECONSTRUCTION OF EYELID     eyelid cancer 10 years ago Dr Victorino Dike    RECONSTRUCTION OF NOSE     nose cancer/ Dr Nyoka Cowden 10 years ago    Family History  Problem Relation Age of Onset   Heart disease Father    Alzheimer's disease Sister    Parkinson's disease Sister    Social History   Socioeconomic History   Marital status: Widowed    Spouse name: Not on file   Number of children: Not on file   Years of education: Not on file   Highest education level: Not on file  Occupational History   Not on file  Tobacco Use   Smoking status: Never   Smokeless tobacco: Never  Vaping Use   Vaping Use: Never used  Substance and Sexual Activity   Alcohol use: Not Currently   Drug use: Not Currently   Sexual activity: Not on file  Other Topics Concern   Not on file  Social History Narrative   Social History      Diet? No restrictions       Do you drink/eat things with caffeine? Sometimes       Marital status?  Widow                                  What year were you married? 1943      Do you live in a house, apartment, assisted living, condo, trailer, etc.? Assisted Living      Is it one or more stories? YES       How many persons live in your home? 1      Do you have any pets in your home? (please list) NO       Highest level of education completed?high school       Current or past profession: Clerical       Do you exercise? Little                                      Type & how often? Walking        Advanced Directive       Do you have a living will?YES      Do you have a DNR form? NO  If not, do you want to discuss one?      Do you have signed POA/HPOA for forms? YES      Functional  Status      Do you have difficulty bathing or dressing yourself?YES      Do you have difficulty preparing food or eating? NO      Do you have difficulty managing your medications?YES      Do you have difficulty managing your finances?YES      Do you have difficulty affording your medications?NO      Social Determinants of Radio broadcast assistant Strain: Not on file  Food Insecurity: Not on file  Transportation Needs: Not on file  Physical Activity: Not on file  Stress: Not on file  Social Connections: Not on file    Tobacco Counseling Counseling given: Not Answered   Clinical Intake:  Pre-visit preparation completed: Yes  Pain : 0-10 Pain Score: 2  Pain Type: Chronic pain Pain Location: Back Pain Orientation: Mid Pain Descriptors / Indicators: Aching Pain Onset: More than a month ago Pain Frequency: Occasional Pain Relieving Factors: rest, Tylenol. Effect of Pain on Daily Activities: may stay in bed somtimes.  Pain Relieving Factors: rest, Tylenol.  BMI - recorded: 26.55 Nutritional Status: BMI 25 -29 Overweight Nutritional Risks: None  How often do you need to have someone help you when you read instructions, pamphlets, or other written materials from your doctor or pharmacy?: 4 - Often What is the last grade level you completed in school?: college.  Diabetic?no  Interpreter Needed?: No  Information entered by :: Bunnie Lederman Bretta Bang NP   Activities of Daily Living In your present state of health, do you have any difficulty performing the following activities: 01/26/2022  Hearing? Y  Vision? N  Difficulty concentrating or making decisions? Y  Walking or climbing stairs? Y  Dressing or bathing? Y  Doing errands, shopping? Y  Preparing Food and eating ? N  Using the Toilet? Y  In the past six months, have you accidently leaked urine? Y  Do you have problems with loss of bowel control? Y  Managing your Medications? Y  Managing your Finances? Y   Housekeeping or managing your Housekeeping? Y  Some recent data might be hidden    Patient Care Team: Virgie Dad, MD as PCP - General (Internal Medicine) Pichardo-Geisinger, Mila Palmer, MD as Referring Physician Nestor Ramp Effie Shy, MD as Referring Physician (Ophthalmology)  Indicate any recent Medical Services you may have received from other than [facility] providers in the past year (date may be approximate).     Assessment:   This is a routine wellness examination for Sharen.  Hearing/Vision screen No results found.  Dietary issues and exercise activities discussed: Current Exercise Habits: The patient does not participate in regular exercise at present, Exercise limited by: neurologic condition(s);orthopedic condition(s);psychological condition(s)   Goals Addressed             This Visit's Progress    Maintain Mobility and Function       Evidence-based guidance:  Emphasize the importance of physical activity and aerobic exercise as included in treatment plan; assess barriers to adherence; consider patient's abilities and preferences.  Encourage gradual increase in activity or exercise instead of stopping if pain occurs.  Reinforce individual therapy exercise prescription, such as strengthening, stabilization and stretching programs.  Promote optimal body mechanics to stabilize the spine with lifting and functional activity.  Encourage activity and mobility modifications to facilitate optimal  function, such as using a log roll for bed mobility or dressing from a seated position.  Reinforce individual adaptive equipment recommendations to limit excessive spinal movements, such as a Systems analyst.  Assess adequacy of sleep; encourage use of sleep hygiene techniques, such as bedtime routine; use of white noise; dark, cool bedroom; avoiding daytime naps, heavy meals or exercise before bedtime.  Promote positions and modification to optimize sleep and sexual activity;  consider pillows or positioning devices to assist in maintaining neutral spine.  Explore options for applying ergonomic principles at work and home, such as frequent position changes, using ergonomically designed equipment and working at optimal height.  Promote modifications to increase comfort with driving such as lumbar support, optimizing seat and steering wheel position, using cruise control and taking frequent rest stops to stretch and walk.   Notes:        Depression Screen No flowsheet data found.  Fall Risk No flowsheet data found.  FALL RISK PREVENTION PERTAINING TO THE HOME:  Any stairs in or around the home? Yes  If so, are there any without handrails? No  Home free of loose throw rugs in walkways, pet beds, electrical cords, etc? Yes  Adequate lighting in your home to reduce risk of falls? Yes   ASSISTIVE DEVICES UTILIZED TO PREVENT FALLS:  Life alert? No  Use of a cane, walker or w/c? Yes  Grab bars in the bathroom? Yes  Shower chair or bench in shower? Yes  Elevated toilet seat or a handicapped toilet? Yes   TIMED UP AND GO:  Was the test performed? No .    Cognitive Function:  limited    Immunizations Immunization History  Administered Date(s) Administered   Influenza, High Dose Seasonal PF 10/02/2013, 08/25/2015, 09/03/2016, 09/05/2017, 09/12/2018, 08/25/2019   Influenza-Unspecified 10/01/2014, 09/03/2020, 09/09/2021   Moderna SARS-COV2 Booster Vaccination 04/21/2021   Moderna Sars-Covid-2 Vaccination 11/24/2019, 12/22/2019, 09/30/2020, 04/21/2021   Pneumococcal Conjugate-13 09/30/2014, 10/21/2014   Pneumococcal Polysaccharide-23 01/23/2018   Td 04/18/2013   Unspecified SARS-COV-2 Vaccination 11/04/2021   Zoster, Live 09/04/2012    TDAP status: Up to date  Flu Vaccine status: Up to date  Pneumococcal vaccine status: Up to date  Covid-19 vaccine status: Completed vaccines  Qualifies for Shingles Vaccine? Yes   Zostavax completed No    Shingrix Completed?: No.    Education has been provided regarding the importance of this vaccine. Patient has been advised to call insurance company to determine out of pocket expense if they have not yet received this vaccine. Advised may also receive vaccine at local pharmacy or Health Dept. Verbalized acceptance and understanding.  Screening Tests Health Maintenance  Topic Date Due   Zoster Vaccines- Shingrix (1 of 2) Never done   DEXA SCAN  Never done   TETANUS/TDAP  04/19/2023   Pneumonia Vaccine 22+ Years old  Completed   INFLUENZA VACCINE  Completed   COVID-19 Vaccine  Completed   HPV VACCINES  Aged Out    Health Maintenance  Health Maintenance Due  Topic Date Due   Zoster Vaccines- Shingrix (1 of 2) Never done   DEXA SCAN  Never done    Colorectal cancer screening: No longer required.   Mammogram status: No longer required due to aged out.  Bone Density status: Ordered DEXA. Pt provided with contact info and advised to call to schedule appt.  Lung Cancer Screening: (Low Dose CT Chest recommended if Age 94-80 years, 30 pack-year currently smoking OR have quit w/in 15years.) does not qualify.  Additional Screening:  Hepatitis C Screening: does not qualify  Vision Screening: Recommended annual ophthalmology exams for early detection of glaucoma and other disorders of the eye. Is the patient up to date with their annual eye exam?  No  Who is the provider or what is the name of the office in which the patient attends annual eye exams? Will refer if desires. If pt is not established with a provider, would they like to be referred to a provider to establish care? Yes .   Dental Screening: Recommended annual dental exams for proper oral hygiene  Community Resource Referral / Chronic Care Management: CRR required this visit?  No   CCM required this visit?  No      Plan:   DEXA, Shingrix, Eye Exam recommended.   I have personally reviewed and noted the following  in the patients chart:   Medical and social history Use of alcohol, tobacco or illicit drugs  Current medications and supplements including opioid prescriptions.  Functional ability and status Nutritional status Physical activity Advanced directives List of other physicians Hospitalizations, surgeries, and ER visits in previous 12 months Vitals Screenings to include cognitive, depression, and falls Referrals and appointments  In addition, I have reviewed and discussed with patient certain preventive protocols, quality metrics, and best practice recommendations. A written personalized care plan for preventive services as well as general preventive health recommendations were provided to patient.     Neftaly Inzunza X Mariah Gerstenberger, NP   01/30/2022

## 2022-02-02 ENCOUNTER — Non-Acute Institutional Stay (SKILLED_NURSING_FACILITY): Payer: Medicare Other | Admitting: Internal Medicine

## 2022-02-02 ENCOUNTER — Encounter: Payer: Self-pay | Admitting: Internal Medicine

## 2022-02-02 DIAGNOSIS — F418 Other specified anxiety disorders: Secondary | ICD-10-CM | POA: Diagnosis not present

## 2022-02-02 DIAGNOSIS — I480 Paroxysmal atrial fibrillation: Secondary | ICD-10-CM

## 2022-02-02 DIAGNOSIS — M069 Rheumatoid arthritis, unspecified: Secondary | ICD-10-CM

## 2022-02-02 DIAGNOSIS — F01518 Vascular dementia, unspecified severity, with other behavioral disturbance: Secondary | ICD-10-CM | POA: Diagnosis not present

## 2022-02-02 NOTE — Progress Notes (Signed)
?Location:   [facility] ?Nursing Home Room Number: 50 ?Place of Ser.vice:  SNF ([id number]) ?Provider:  Veleta Miners MD ? ?Virgie Dad, MD ? ?Patient Care Team: ?Virgie Dad, MD as PCP - General (Internal Medicine) ?Pichardo-Geisinger, Mila Palmer, MD as Referring Physician ?Konrad Saha, MD as Referring Physician (Ophthalmology) ? ?Extended Emergency Contact Information ?Primary Emergency Contact: Key, Tim ?Address: [address] ?         [place], [place] [address] [place] ?Mobile Phone: [phone] ?Relation: Nephew ?Secondary Emergency Contact: Owens Shark ?Address: [address] ?         [place], [place] [address] [place] ?Home Phone: [phone] ?Mobile Phone: [phone] ?Relation: Other ? ?Code Status:  Full Code ?Goals of care: Advanced Directive information ?Advanced Directives 02/02/2022  ?Does Patient Have a Medical Advance Directive? Yes  ?Type of Paramedic of [place];Living will  ?Does patient want to make changes to medical advance directive? No - Patient declined  ?Copy of [place] in Chart? Yes - validated most recent copy scanned in chart (See row information)  ?Would patient like information on creating a medical advance directive? -  ?Pre-existing out of facility DNR order (yellow form or pink MOST form) -  ? ? ? ?Chief Complaint  ?Patient presents with  ? Acute Visit  ?  Aggressive behaviors   ? ? ?HPI:  ?Pt is a 88 y.o. female seen today for an acute visit for Agitation ?Patient has a history of PAF on Eliquis, GERD, Rheumatoid arthritis, Hyperlipidemia, Osteoporosis and Dementia with Anxiety and Behavior issues ?  ?Lives in SNF ?Per Night Nurse patient got very agitated with one of the resident ?Threatened her ?Also resisting care ?Talked to Morning nurse and per then she is  back to her baseline ?Pleasantly confuse  ?Did not have any acute complains today ? ?Past Medical History:   ?Diagnosis Date  ? Acid reflux disease   ? Arthritis   ? rheumatoid  ? Atrial fibrillation ([place])   ? Back pain   ? Bursitis of hip, right   ? Cancer [facility])   ? skin  ? Chest pain   ? Confusion   ? Constipation   ? Fuchs' corneal dystrophy   ? GERD (gastroesophageal reflux disease)   ? Hard of hearing   ? Knee pain   ? Memory loss   ? Paranoia ([place])   ? Shoulder pain   ? Venous insufficiency   ? ?Past Surgical History:  ?Procedure Laterality Date  ? CORNEAL TRANSPLANT  2012  ? Dr Maudie Mercury   ? RECONSTRUCTION OF EYELID    ? 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 RECONSTRUCTION OF NOSE    ? nose cancer/ Dr Nyoka Cowden 10 years ago   ? ? ?Allergies  ?Allergen Reactions  ? Novocain [Procaine] Other (See Comments)  ?  Unknown reaction  ? Sulfamethoxazole-Trimethoprim Other (See Comments)  ?  Unknown reaction  ? Tape Other (See Comments)  ?  Adhesive tape - unknown reaction  ? Codeine Other (See Comments)  ?  Unknown reaction  ? Neosporin [Neomycin-Bacitracin Zn-Polymyx] Other (See Comments)  ?  Unknown reaction  ? ? ?Allergies as of 02/02/2022   ? ?   Reactions  ? Novocain [procaine] Other (See Comments)  ? Unknown reaction  ? Sulfamethoxazole-trimethoprim Other (See Comments)  ? Unknown reaction  ? Tape Other (See Comments)  ? Adhesive tape - unknown reaction  ?  Codeine Other (See Comments)  ? Unknown reaction  ? Neosporin [neomycin-bacitracin Zn-polymyx] Other (See Comments)  ? Unknown reaction  ? ?  ? ?  ?Medication List  ?  ? ?  ? Accurate as of February 02, 2022 10:54 AM. If you have any questions, ask your nurse or doctor.  ?  ?  ? ?  ? ?acetaminophen 325 MG tablet ?Commonly known as: TYLENOL ?Take 650 mg by mouth every 6 (six) hours as needed. ?  ?apixaban 2.5 MG Tabs tablet ?Commonly known as: ELIQUIS ?Take 2.5 mg by mouth 2 (two) times daily. ?  ?Aquaphor Adv Protect Healing 41 % Oint ?Apply 1 application topically daily. ?  ?denosumab 60 MG/ML Sosy injection ?Commonly known as: PROLIA ?Inject 60 mg into the skin every 6  (six) months. On the 22nd of reach 6th month ?  ?folic acid 1 MG tablet ?Commonly known as: FOLVITE ?Take 1 mg by mouth daily. ?  ?lactose free nutrition Liqd ?Take 237 mLs by mouth daily. ?  ?Lidocaine 4 % Ptch ?Apply topically. Apply to lower back Q 24hrs ?Once A Day ?  ?memantine 10 MG tablet ?Commonly known as: NAMENDA ?Take 10 mg by mouth 2 (two) times daily. ?  ?methotrexate 2.5 MG tablet ?Commonly known as: RHEUMATREX ?Take 15 mg by mouth every Friday. Caution:Chemotherapy. Protect from light. ?  ?Multiple Vitamin-Folic Acid Tabs ?Take 1 tablet by mouth daily. + B12 ?  ?polyethylene glycol 17 g packet ?Commonly known as: MIRALAX / GLYCOLAX ?Take 17 g by mouth every other day. ?  ?pravastatin 20 MG tablet ?Commonly known as: PRAVACHOL ?Take 20 mg by mouth at bedtime. ?  ?prednisoLONE acetate 1 % ophthalmic suspension ?Commonly known as: PRED FORTE ?Place 1 drop into the right eye daily. ?  ?QUEtiapine 25 MG tablet ?Commonly known as: SEROQUEL ?Take 12.5 mg by mouth 2 (two) times daily. ?  ?senna 8.6 MG tablet ?Commonly known as: SENOKOT ?Take 1 tablet by mouth at bedtime. ?  ?sertraline 50 MG tablet ?Commonly known as: ZOLOFT ?Take 50 mg by mouth daily. ?  ?Systane 0.4-0.3 % Soln ?Generic drug: Polyethyl Glycol-Propyl Glycol ?Place 1 drop into both eyes in the morning and at bedtime. ?  ? ?  ? ? ?Review of Systems  ?Constitutional:  Negative for activity change and appetite change.  ?HENT: Negative.    ?Respiratory:  Negative for cough and shortness of breath.   ?Cardiovascular:  Negative for leg swelling.  ?Gastrointestinal:  Negative for constipation.  ?Genitourinary: Negative.   ?Musculoskeletal:  Positive for gait problem. Negative for arthralgias and myalgias.  ?Skin: Negative.   ?Neurological:  Negative for dizziness and weakness.  ?Psychiatric/Behavioral:  Positive for confusion. Negative for dysphoric mood and sleep disturbance.   ? ?Immunization History  ?Administered Date(s) Administered  ?  Influenza, High Dose Seasonal PF 10/02/2013, 08/25/2015, 09/03/2016, 09/05/2017, 09/12/2018, 08/25/2019  ? Influenza-Unspecified 10/01/2014, 09/03/2020, 09/09/2021  ? Moderna SARS-COV2 Booster Vaccination 04/21/2021  ? Moderna Sars-Covid-2 Vaccination 11/24/2019, 12/22/2019, 09/30/2020, 04/21/2021  ? Pneumococcal Conjugate-13 09/30/2014, 10/21/2014  ? Pneumococcal Polysaccharide-23 01/23/2018  ? Td 04/18/2013  ? Unspecified SARS-COV-2 Vaccination 11/04/2021  ? Zoster, Live 09/04/2012  ? ?Pertinent  Health Maintenance Due  ?Topic Date Due  ? DEXA SCAN  Never done  ? INFLUENZA VACCINE  Completed  ? ?Fall Risk 07/22/2020 07/22/2020 07/23/2020 07/23/2020 07/24/2020  ?Patient Fall Risk Level High fall risk High fall risk High fall risk High fall risk High fall risk  ? ?Functional Status Survey: ?  ? ?  Vitals:  ? 02/02/22 1049  ?BP: 125/82  ?Pulse: 62  ?Resp: 20  ?Temp: 98 ?F (36.7 ?C)  ?SpO2: 94%  ?Weight: 164 lb 8 oz (74.6 kg)  ?Height: '5\' 6"'$  (1.676 m)  ? ?Body mass index is 26.55 kg/m?Marland Kitchen ?Physical Exam ?Vitals reviewed.  ?Constitutional:   ?   Appearance: Normal appearance.  ?HENT:  ?   Head: Normocephalic.  ?   Nose: Nose normal.  ?   Mouth/Throat:  ?   Mouth: Mucous membranes are moist.  ?   Pharynx: Oropharynx is clear.  ?Eyes:  ?   Pupils: Pupils are equal, round, and reactive to light.  ?Cardiovascular:  ?   Rate and Rhythm: Normal rate and regular rhythm.  ?   Pulses: Normal pulses.  ?   Heart sounds: Normal heart sounds. No murmur heard. ?Pulmonary:  ?   Effort: Pulmonary effort is normal.  ?   Breath sounds: Normal breath sounds.  ?Abdominal:  ?   General: Abdomen is flat. Bowel sounds are normal.  ?   Palpations: Abdomen is soft.  ?Musculoskeletal:     ?   General: No swelling.  ?   Cervical back: Neck supple.  ?Skin: ?   General: Skin is warm.  ?Neurological:  ?   General: No focal deficit present.  ?   Mental Status: She is alert.  ?   Comments: Pleasantly confused ?  ?Psychiatric:     ?   Mood and Affect: Mood  normal.     ?   Thought Content: Thought content normal.  ? ? ?Labs reviewed: ?Recent Labs  ?  05/28/21 ?0000 07/10/21 ?0000 08/28/21 ?0000  ?NA 141 142 139  ?K 4.3 4.2 4.5  ?CL 104 105 102  ?CO2 31* 29* 31*  ?BUN '21 20 17  '$ ?

## 2022-02-12 LAB — COMPREHENSIVE METABOLIC PANEL
Albumin: 3.4 — AB (ref 3.5–5.0)
Calcium: 9.5 (ref 8.7–10.7)
Globulin: 2.3
eGFR: 45

## 2022-02-12 LAB — LIPID PANEL
Cholesterol: 136 (ref 0–200)
HDL: 42 (ref 35–70)
LDL Cholesterol: 74
Triglycerides: 112 (ref 40–160)

## 2022-02-12 LAB — CBC AND DIFFERENTIAL
HCT: 35 — AB (ref 36–46)
Hemoglobin: 11.2 — AB (ref 12.0–16.0)
Platelets: 200 10*3/uL (ref 150–400)
WBC: 5.4

## 2022-02-12 LAB — HEPATIC FUNCTION PANEL
ALT: 8 U/L (ref 7–35)
AST: 16 (ref 13–35)
Alkaline Phosphatase: 51 (ref 25–125)
Bilirubin, Total: 0.3

## 2022-02-12 LAB — BASIC METABOLIC PANEL
BUN: 17 (ref 4–21)
CO2: 23 — AB (ref 13–22)
Chloride: 105 (ref 99–108)
Creatinine: 1.1 (ref 0.5–1.1)
Glucose: 77
Potassium: 4.6 mEq/L (ref 3.5–5.1)
Sodium: 142 (ref 137–147)

## 2022-02-12 LAB — CBC: RBC: 3.56 — AB (ref 3.87–5.11)

## 2022-03-04 ENCOUNTER — Encounter: Payer: Self-pay | Admitting: Nurse Practitioner

## 2022-03-04 ENCOUNTER — Non-Acute Institutional Stay (SKILLED_NURSING_FACILITY): Payer: Medicare Other | Admitting: Nurse Practitioner

## 2022-03-04 DIAGNOSIS — R269 Unspecified abnormalities of gait and mobility: Secondary | ICD-10-CM | POA: Diagnosis not present

## 2022-03-04 DIAGNOSIS — M810 Age-related osteoporosis without current pathological fracture: Secondary | ICD-10-CM

## 2022-03-04 DIAGNOSIS — F418 Other specified anxiety disorders: Secondary | ICD-10-CM

## 2022-03-04 DIAGNOSIS — F01C11 Vascular dementia, severe, with agitation: Secondary | ICD-10-CM

## 2022-03-04 DIAGNOSIS — D638 Anemia in other chronic diseases classified elsewhere: Secondary | ICD-10-CM

## 2022-03-04 DIAGNOSIS — I480 Paroxysmal atrial fibrillation: Secondary | ICD-10-CM

## 2022-03-04 DIAGNOSIS — S32050D Wedge compression fracture of fifth lumbar vertebra, subsequent encounter for fracture with routine healing: Secondary | ICD-10-CM

## 2022-03-04 DIAGNOSIS — K5901 Slow transit constipation: Secondary | ICD-10-CM

## 2022-03-04 DIAGNOSIS — R296 Repeated falls: Secondary | ICD-10-CM

## 2022-03-04 DIAGNOSIS — M069 Rheumatoid arthritis, unspecified: Secondary | ICD-10-CM

## 2022-03-04 DIAGNOSIS — E785 Hyperlipidemia, unspecified: Secondary | ICD-10-CM

## 2022-03-04 NOTE — Assessment & Plan Note (Signed)
L5 fx sustained from fall, 07/19/20, takes Tylenol.   ?

## 2022-03-04 NOTE — Assessment & Plan Note (Signed)
takes Pravastatin, LDL 74 02/12/22                

## 2022-03-04 NOTE — Assessment & Plan Note (Signed)
Fe 66, Vit B12 1010 12/11/20, takes Folic acid, Hgb 11.2 02/12/22 

## 2022-03-04 NOTE — Assessment & Plan Note (Signed)
Heart rate is in control, continue Eliquis.  ?

## 2022-03-04 NOTE — Assessment & Plan Note (Signed)
uses w/c for mobility ?

## 2022-03-04 NOTE — Assessment & Plan Note (Signed)
takes Prolia, Ca, Vit D, due DEXA  

## 2022-03-04 NOTE — Progress Notes (Signed)
?Location:  [facility] ?Nursing Home Room Number: 16-X ?Place of Service:  SNF (31) ?Provider:  Virgie Dad, MD ? ? ?Virgie Dad, MD ? ?Patient Care Team: ?Virgie Dad, MD as PCP - General (Internal Medicine) ?Pichardo-Geisinger, Mila Palmer, MD as Referring Physician ?Konrad Saha, MD as Referring Physician (Ophthalmology) ? ?Extended Emergency Contact Information ?Primary Emergency Contact: Key, Tim ?Address: [address] ?         [place], [place] [address] [place] ?Mobile Phone: [phone] ?Relation: Nephew ?Secondary Emergency Contact: Owens Shark ?Address: [place] ?         [place], [place] [address] [place] ?Home Phone: [phone] ?Mobile Phone: [phone] ?Relation: Other ? ?Code Status:  Full Code ?Goals of care: Advanced Directive information ? ?  03/04/2022  ? 10:32 AM  ?Advanced Directives  ?Does Patient Have a Medical Advance Directive? Yes  ?Type of Advance Directive Living will;Out of facility DNR (pink MOST or yellow form)  ?Does patient want to make changes to medical advance directive? No - Patient declined  ?Pre-existing out of facility DNR order (yellow form or pink MOST form) Pink MOST form placed in chart (order not valid for inpatient use)  ? ? ? ?Chief Complaint  ?Patient presents with  ? Routine  ? ? ?HPI:  ?Pt is a 88 y.o. female seen today for medical management of chronic diseases.   ?  ? Gait abnormality, uses w/c for mobility, risk of falling, found on floor today.  ?            L5 fx sustained from fall, 07/19/20, takes Tylenol.   ?            Constipation, takes Senna, MiraLax  ?             Dementia,  takes Memantine ?            Afib, takes Eliquis ?            Depression/anxiety, takes Sertraline, Quetiapine,  off Buspar 09/26/20, prn Lorazepam x 2 weeks since 02/02/22-no longer needed. TSH 3.70 07/10/21 ?            RA, takes Methotrexate '15mg'$  wkly, also takes Tylenol qid.  ?            OP, takes Prolia, Ca,  Vit D, due DEXA  ?            Anemia, Fe 66, Vit B12 1010 1/32/44, takes Folic acid, Hgb 01.0 2/72/53 ?            Hyperlipidemia, takes Pravastatin, LDL 74 02/12/22 ?  ? ?Past Medical History:  ?Diagnosis Date  ? Acid reflux disease   ? Arthritis   ? rheumatoid  ? Atrial fibrillation ([place])   ? Back pain   ? Bursitis of hip, right   ? Cancer [facility])   ? skin  ? Chest pain   ? Confusion   ? Constipation   ? Fuchs' corneal dystrophy   ? GERD (gastroesophageal reflux disease)   ? Hard of hearing   ? Knee pain   ? Memory loss   ? Paranoia ([place])   ? Shoulder pain   ? Venous insufficiency   ? ?Past Surgical History:  ?Procedure Laterality Date  ? CORNEAL TRANSPLANT  2012  ? Dr Maudie Mercury   ? RECONSTRUCTION OF EYELID    ? 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 RECONSTRUCTION OF NOSE    ?  nose cancer/ Dr Nyoka Cowden 10 years ago   ? ? ?Allergies  ?Allergen Reactions  ? Novocain [Procaine] Other (See Comments)  ?  Unknown reaction  ? Sulfamethoxazole-Trimethoprim Other (See Comments)  ?  Unknown reaction  ? Tape Other (See Comments)  ?  Adhesive tape - unknown reaction  ? Codeine Other (See Comments)  ?  Unknown reaction  ? Neosporin [Neomycin-Bacitracin Zn-Polymyx] Other (See Comments)  ?  Unknown reaction  ? ? ?Outpatient Encounter Medications as of 03/04/2022  ?Medication Sig  ? acetaminophen (TYLENOL) 325 MG tablet Take 650 mg by mouth every 6 (six) hours as needed.  ? apixaban (ELIQUIS) 2.5 MG TABS tablet Take 2.5 mg by mouth 2 (two) times daily.  ? denosumab (PROLIA) 60 MG/ML SOSY injection Inject 60 mg into the skin every 6 (six) months. On the 22nd of reach 6th month  ? Emollient (AQUAPHOR ADV PROTECT HEALING) 41 % OINT Apply 1 application topically daily.  ? fluoruracil (CARAC) 0.5 % cream a thin layer; topical ?Special Instructions: Apply cream to marked middle part of nose and right side of mouth twice daily for three weeks. ?[DX: Malignant melanoma of other parts of face]  ? folic acid (FOLVITE) 1 MG tablet Take 1 mg by  mouth daily.  ? lactose free nutrition (BOOST PLUS) LIQD Take 237 mLs by mouth daily.  ? Lidocaine 4 % PTCH Apply topically. Apply to lower back Q 24hrs ?Once A Day  ? memantine (NAMENDA) 10 MG tablet Take 10 mg by mouth 2 (two) times daily.   ? methotrexate (RHEUMATREX) 2.5 MG tablet Take 15 mg by mouth every Friday. Caution:Chemotherapy. Protect from light.  ? Multiple Vitamin-Folic Acid TABS Take 1 tablet by mouth daily. + B12  ? Polyethyl Glycol-Propyl Glycol (SYSTANE) 0.4-0.3 % SOLN Place 1 drop into both eyes in the morning and at bedtime.   ? polyethylene glycol (MIRALAX / GLYCOLAX) 17 g packet Take 17 g by mouth every other day.  ? pravastatin (PRAVACHOL) 20 MG tablet Take 20 mg by mouth at bedtime.   ? prednisoLONE acetate (PRED FORTE) 1 % ophthalmic suspension Place 1 drop into the right eye daily.  ? QUEtiapine (SEROQUEL) 25 MG tablet Take 12.5 mg by mouth 2 (two) times daily.  ? senna (SENOKOT) 8.6 MG tablet Take 1 tablet by mouth at bedtime.  ? sertraline (ZOLOFT) 50 MG tablet Take 50 mg by mouth daily.   ? [DISCONTINUED] LORazepam (ATIVAN) 0.5 MG tablet Take 0.5 mg by mouth every 8 (eight) hours as needed for anxiety.  ? ?No facility-administered encounter medications on file as of 03/04/2022.  ? ? ?Review of Systems  ?Unable to perform ROS: Dementia  ? ?Immunization History  ?Administered Date(s) Administered  ? Influenza, High Dose Seasonal PF 10/02/2013, 08/25/2015, 09/03/2016, 09/05/2017, 09/12/2018, 08/25/2019  ? Influenza-Unspecified 10/01/2014, 09/03/2020, 09/09/2021  ? Moderna SARS-COV2 Booster Vaccination 04/21/2021  ? Moderna Sars-Covid-2 Vaccination 11/24/2019, 12/22/2019, 09/30/2020, 04/21/2021  ? Pneumococcal Conjugate-13 09/30/2014, 10/21/2014  ? Pneumococcal Polysaccharide-23 01/23/2018  ? Td 04/18/2013  ? Unspecified SARS-COV-2 Vaccination 11/04/2021  ? Zoster, Live 09/04/2012  ? ?Pertinent  Health Maintenance Due  ?Topic Date Due  ? DEXA SCAN  Never done  ? INFLUENZA VACCINE  06/22/2022   ? ? ?  07/22/2020  ?  8:00 AM 07/22/2020  ?  9:30 PM 07/23/2020  ?  8:00 AM 07/23/2020  ?  7:30 PM 07/24/2020  ?  7:25 AM  ?Fall Risk  ?Patient Fall Risk Level High fall risk High fall  risk High fall risk High fall risk High fall risk  ? ?Functional Status Survey: ?  ? ?Vitals:  ? 03/04/22 1022  ?BP: 111/60  ?Pulse: 75  ?Resp: 18  ?Temp: (!) 97 ?F (36.1 ?C)  ?SpO2: 93%  ?Weight: 167 lb 14.4 oz (76.2 kg)  ?Height: '5\' 6"'$  (1.676 m)  ? ?Body mass index is 27.1 kg/m?Marland Kitchen ?Physical Exam ?Vitals and nursing note reviewed.  ?Constitutional:   ?   Appearance: Normal appearance.  ?HENT:  ?   Head: Normocephalic and atraumatic.  ?Eyes:  ?   Extraocular Movements: Extraocular movements intact.  ?   Conjunctiva/sclera: Conjunctivae normal.  ?   Pupils: Pupils are equal, round, and reactive to light.  ?Cardiovascular:  ?   Rate and Rhythm: Normal rate and regular rhythm.  ?Pulmonary:  ?   Effort: Pulmonary effort is normal.  ?   Breath sounds: No rales.  ?Abdominal:  ?   General: Bowel sounds are normal.  ?   Palpations: Abdomen is soft.  ?   Tenderness: There is no abdominal tenderness.  ?   Comments: Right inguinal hernia, reducible, a tennis ball sized from previous examination.   ?Musculoskeletal:  ?   Cervical back: Normal range of motion and neck supple.  ?   Right lower leg: No edema.  ?   Left lower leg: No edema.  ?   Comments: Lower back pain is better.   ?Skin: ?   General: Skin is warm and dry.  ?Neurological:  ?   General: No focal deficit present.  ?   Mental Status: She is alert. Mental status is at baseline.  ?   Gait: Gait abnormal.  ?   Comments: Oriented to person, place.   ?Psychiatric:     ?   Mood and Affect: Mood normal.     ?   Behavior: Behavior normal.  ?   Comments: Pleasantly confused.   ? ? ?Labs reviewed: ?Recent Labs  ?  07/10/21 ?0000 08/28/21 ?0000 02/12/22 ?0000  ?NA 142 139 142  ?K 4.2 4.5 4.6  ?CL 105 102 105  ?CO2 29* 31* 23*  ?BUN '20 17 17  '$ ?CREATININE 1.0 1.0 1.1  ?CALCIUM 10.2 9.8 9.5  ? ?Recent Labs   ?  07/10/21 ?0000 08/28/21 ?0000 02/12/22 ?0000  ?AST '15 15 16  '$ ?ALT 6* 9 8  ?ALKPHOS 55 75 51  ?ALBUMIN 3.3* 4.1 3.4*  ? ?Recent Labs  ?  05/28/21 ?0000 07/10/21 ?0000 08/28/21 ?0000 02/12/22 ?0000

## 2022-03-04 NOTE — Assessment & Plan Note (Signed)
Stable, takes Senna, MiraLax.  

## 2022-03-04 NOTE — Assessment & Plan Note (Signed)
takes Methotrexate 15mg wkly, also takes Tylenol qid.   

## 2022-03-04 NOTE — Assessment & Plan Note (Signed)
takes Sertraline, Quetiapine,  off Buspar 09/26/20, prn Lorazepam x 2 weeks since 02/02/22-no longer needed. TSH 3.70 07/10/21 ?

## 2022-03-04 NOTE — Assessment & Plan Note (Signed)
Supportive care in SNF [facility],  takes Memantine ?

## 2022-03-05 ENCOUNTER — Encounter: Payer: Self-pay | Admitting: Nurse Practitioner

## 2022-03-05 NOTE — Assessment & Plan Note (Signed)
Found on floor, no apparent injury, under nursing [place] ?

## 2022-03-24 ENCOUNTER — Non-Acute Institutional Stay (SKILLED_NURSING_FACILITY): Payer: Medicare Other | Admitting: Orthopedic Surgery

## 2022-03-24 ENCOUNTER — Encounter: Payer: Self-pay | Admitting: Orthopedic Surgery

## 2022-03-24 DIAGNOSIS — F418 Other specified anxiety disorders: Secondary | ICD-10-CM

## 2022-03-24 DIAGNOSIS — M069 Rheumatoid arthritis, unspecified: Secondary | ICD-10-CM | POA: Diagnosis not present

## 2022-03-24 DIAGNOSIS — S32050D Wedge compression fracture of fifth lumbar vertebra, subsequent encounter for fracture with routine healing: Secondary | ICD-10-CM

## 2022-03-24 DIAGNOSIS — R635 Abnormal weight gain: Secondary | ICD-10-CM

## 2022-03-24 DIAGNOSIS — I480 Paroxysmal atrial fibrillation: Secondary | ICD-10-CM | POA: Diagnosis not present

## 2022-03-24 DIAGNOSIS — K5901 Slow transit constipation: Secondary | ICD-10-CM

## 2022-03-24 DIAGNOSIS — F01C11 Vascular dementia, severe, with agitation: Secondary | ICD-10-CM | POA: Diagnosis not present

## 2022-03-24 DIAGNOSIS — E785 Hyperlipidemia, unspecified: Secondary | ICD-10-CM

## 2022-03-24 DIAGNOSIS — M810 Age-related osteoporosis without current pathological fracture: Secondary | ICD-10-CM

## 2022-03-24 DIAGNOSIS — D638 Anemia in other chronic diseases classified elsewhere: Secondary | ICD-10-CM

## 2022-03-24 NOTE — Progress Notes (Addendum)
Location[facility]  [facility] Room Number: N050/A Place of Service:  SNF [phone]) Provider: Octavia Heir, NP   Patient Care Team: Mahlon Gammon, MD as PCP - General (Internal Medicine) Pichardo-Geisinger, Robby Sermon, MD as Referring Physician Michel Santee Lorenza Evangelist, MD as Referring Physician (Ophthalmology)  Extended Emergency Contact Information Primary Emergency Contact: Key, Tim Address: [address]          [place], [place] [address] [place] Phone: [phone] Relation: Nephew Secondary Emergency Contact: [place] [facility] Address: [address]          [place], [place] [address] [place] Home Phone: [phone] Mobile Phone: [phone] Relation: Other  Code Status:  Full code Goals of care: Advanced Directive information    03/24/2022    2:30 PM  Advanced Directives  Does Patient Have a Medical Advance Directive? Yes  Type of Advance Directive Living will  Does patient want to make changes to medical advance directive? No - Patient declined  Pre-existing out of facility DNR order (yellow form or pink MOST form) Pink MOST form placed in chart (order not valid for inpatient use)     Chief Complaint  Patient presents with   Medical Management of Chronic Issues    Routine visit.   Quality Metric Gaps    Discuss the need for Shingrix vaccine and Dexa scan, or post pone if patient refuses.     HPI:  Pt is a 88 y.o. female seen today for medical management of chronic diseases.    She currently resides on the skilled nursing unit at [facility]. PMH: PAF, constipation, dementia, osteoporosis, RA, anemia, depression and anxiety.   Weight gain- BMI 27.36, see trends below, daughter requesting Boost be discontinued Vascular dementia- MMSE 18/30, remains on Seroquel and Namenda Afib- TSH 3.70 06/2021, remains on Eliquis for clot prevention RA- remains on methotrexate weekly and scheduled tylenol QID HLD- LDL  74 02/12/2022, remains on pravastatin Anemia- Hgb 11.2 02/12/2022, remains on folic acid Chronic back pain- h/o L5 vertebral fracture, remains on tylenol and lidocaine patches Depression/anxiety- no mood changes, remains on Zoloft Osteoporosis- DEXA 1990, remains on Prolia  Constipation- LBM 05/03, remains on senna and miralax  Recent weights:  05/01- 169.5 lbs  04/01- 167.9 lbs  03/01- 164.5 lbs  12/01- 151.8 lbs  Recent blood pressures:  05/02- 122/72  05/01- 116/76  04/24- 143/103         Past Medical History:  Diagnosis Date   Acid reflux disease    Arthritis    rheumatoid   Atrial fibrillation (HCC)    Back pain    Bursitis of hip, right    Cancer (HCC)    skin   Chest pain    Confusion    Constipation    Fuchs' corneal dystrophy    GERD (gastroesophageal reflux disease)    Hard of hearing    Knee pain    Memory loss    Paranoia ([facility])    Shoulder pain    Venous insufficiency    Past Surgical History:  Procedure Laterality Date   CORNEAL TRANSPLANT  2012   Dr Selena Batten    RECONSTRUCTION OF EYELID     eyelid cancer 10 years ago Dr Nehemiah Settle    RECONSTRUCTION OF NOSE     nose cancer/ Dr Elonda Husky 10 years ago     Allergies  Allergen Reactions   Novocain [Procaine] Other (See Comments)    Unknown reaction   Sulfamethoxazole-Trimethoprim  Other (See Comments)    Unknown reaction   Tape Other (See Comments)    Adhesive tape - unknown reaction   Codeine Other (See Comments)    Unknown reaction   Neosporin [Neomycin-Bacitracin Zn-Polymyx] Other (See Comments)    Unknown reaction    Outpatient Encounter Medications as of 03/24/2022  Medication Sig   acetaminophen (TYLENOL) 325 MG tablet Take 650 mg by mouth every 6 (six) hours as needed.   apixaban (ELIQUIS) 2.5 MG TABS tablet Take 2.5 mg by mouth 2 (two) times daily.   denosumab (PROLIA) 60 MG/ML SOSY injection Inject 60 mg into the skin every 6 (six) months. On the 22nd of reach 6th month   Emollient  (AQUAPHOR ADV PROTECT HEALING) 41 % OINT Apply 1 application topically daily.   folic acid (FOLVITE) 1 MG tablet Take 1 mg by mouth daily.   lactose free nutrition (BOOST PLUS) LIQD Take 237 mLs by mouth daily.   Lidocaine 4 % PTCH Apply topically. Apply to lower back Q 24hrs Once A Day   memantine (NAMENDA) 10 MG tablet Take 10 mg by mouth 2 (two) times daily.    methotrexate (RHEUMATREX) 2.5 MG tablet Take 15 mg by mouth every Friday. Caution:Chemotherapy. Protect from light.   Multiple Vitamin-Folic Acid TABS Take 1 tablet by mouth daily. + B12   Polyethyl Glycol-Propyl Glycol (SYSTANE) 0.4-0.3 % SOLN Place 1 drop into both eyes in the morning and at bedtime.    polyethylene glycol (MIRALAX / GLYCOLAX) 17 g packet Take 17 g by mouth every other day.   pravastatin (PRAVACHOL) 20 MG tablet Take 20 mg by mouth at bedtime.    prednisoLONE acetate (PRED FORTE) 1 % ophthalmic suspension Place 1 drop into the right eye daily.   QUEtiapine (SEROQUEL) 25 MG tablet Take 12.5 mg by mouth 2 (two) times daily.   senna (SENOKOT) 8.6 MG tablet Take 1 tablet by mouth at bedtime.   sertraline (ZOLOFT) 50 MG tablet Take 50 mg by mouth daily.    white petrolatum (VASELINE) GEL Apply 1 application. topically daily. Apply to nose and bilateral temples   No facility-administered encounter medications on file as of 03/24/2022.    Review of Systems  Unable to perform ROS: Dementia   Immunization History  Administered Date(s) Administered   Influenza, High Dose Seasonal PF 10/02/2013, 08/25/2015, 09/03/2016, 09/05/2017, 09/12/2018, 08/25/2019   Influenza-Unspecified 10/01/2014, 09/03/2020, 09/09/2021   Moderna SARS-COV2 Booster Vaccination 04/21/2021   Moderna Sars-Covid-2 Vaccination 11/24/2019, 12/22/2019, 09/30/2020, 04/21/2021   Pneumococcal Conjugate-13 09/30/2014, 10/21/2014   Pneumococcal Polysaccharide-23 01/23/2018   Td 04/18/2013   Unspecified SARS-COV-2 Vaccination 11/04/2021   Zoster, Live  09/04/2012   Pertinent  Health Maintenance Due  Topic Date Due   DEXA SCAN  Never done   INFLUENZA VACCINE  06/22/2022      07/22/2020    8:00 AM 07/22/2020    9:30 PM 07/23/2020    8:00 AM 07/23/2020    7:30 PM 07/24/2020    7:25 AM  Fall Risk  Patient Fall Risk Level High fall risk High fall risk High fall risk High fall risk High fall risk   Functional Status Survey:    Vitals:   03/24/22 1419  BP: 122/72  Pulse: 80  Resp: 18  Temp: 98.5 F (36.9 C)  SpO2: 91%  Weight: 169 lb 8 oz (76.9 kg)  Height: 5\' 6"  (1.676 m)   Body mass index is 27.36 kg/m. Physical Exam Vitals reviewed.  Constitutional:  General: She is not in acute distress. HENT:     Head: Normocephalic.     Comments: 3 small scabs to right side of mouth, CDI, no drainage    Right Ear: There is no impacted cerumen.     Left Ear: There is no impacted cerumen.     Ears:     Comments: HOH    Nose: Nose normal.     Mouth/Throat:     Mouth: Mucous membranes are moist.  Eyes:     General:        Right eye: No discharge.        Left eye: No discharge.     Comments: glasses  Neck:     Vascular: No carotid bruit.  Cardiovascular:     Rate and Rhythm: Normal rate. Rhythm irregular.     Pulses: Normal pulses.     Heart sounds: Normal heart sounds.  Pulmonary:     Effort: Pulmonary effort is normal. No respiratory distress.     Breath sounds: Normal breath sounds. No wheezing.  Abdominal:     General: Bowel sounds are normal. There is no distension.     Palpations: Abdomen is soft.     Tenderness: There is no abdominal tenderness.  Musculoskeletal:     Cervical back: Neck supple.     Right lower leg: No edema.     Left lower leg: No edema.  Lymphadenopathy:     Cervical: No cervical adenopathy.  Skin:    General: Skin is warm and dry.     Capillary Refill: Capillary refill takes less than 2 seconds.  Neurological:     General: No focal deficit present.     Mental Status: She is alert. Mental  status is at baseline.     Motor: Weakness present.     Gait: Gait abnormal.     Comments: wheelchair  Psychiatric:        Mood and Affect: Mood normal.        Behavior: Behavior normal.        Cognition and Memory: Memory is impaired.     Comments: Very pleasant, follows commands, alert to self and familiar faces    Labs reviewed: Recent Labs    07/10/21 0000 08/28/21 0000 02/12/22 0000  NA 142 139 142  K 4.2 4.5 4.6  CL 105 102 105  CO2 29* 31* 23*  BUN 20 17 17   CREATININE 1.0 1.0 1.1  CALCIUM 10.2 9.8 9.5   Recent Labs    07/10/21 0000 08/28/21 0000 02/12/22 0000  AST 15 15 16   ALT 6* 9 8  ALKPHOS 55 75 51  ALBUMIN 3.3* 4.1 3.4*   Recent Labs    05/28/21 0000 07/10/21 0000 08/28/21 0000 02/12/22 0000  WBC 6.1 5.9 5.7 5.4  NEUTROABS 2,946.00 2,614.00 2,827.00  --   HGB 11.5* 10.9* 12.0 11.2*  HCT 35* 34* 36 35*  PLT 220 186 286 200   Lab Results  Component Value Date   TSH 3.70 07/10/2021   No results found for: HGBA1C Lab Results  Component Value Date   CHOL 136 02/12/2022   HDL 42 02/12/2022   LDLCALC 74 02/12/2022   TRIG 112 02/12/2022    Significant Diagnostic Results in last 30 days:  No results found.  Assessment/Plan 1. Weight gain - gained 18 lbs in past 6 months - d/c Boost  - TSH  2. Severe vascular dementia with agitation (HCC) - no behavioral outbursts, ? Skin picking- 3  scabs to side of mouth - cont Seroquel and Namenda - cont skilled nursing care  3. PAF (paroxysmal atrial fibrillation) (HCC) - HR controlled without medication  - cont Eliquis for clot prevention  4. Rheumatoid arthritis, involving unspecified site, unspecified whether rheumatoid factor present (HCC) - cont weekly Methotrexate - cont scheduled tylenol   5. Hyperlipidemia, unspecified hyperlipidemia type - LDL at goal - cont statin  6. Anemia, chronic disease - hgb stable - cont folic acid  7. Closed wedge compression fracture of L5 vertebra with  routine healing, subsequent encounter - cont scheduled tylenol  - cont lidocaine patches  8. Depression with anxiety - no mood changes - cont zoloft  9. Osteoporosis without current pathological fracture, unspecified osteoporosis type - cont Prolia injections  10. Slow transit constipation - abdomen soft - cont senna and miralax    Family/ staff Communication: plan discussed with patient and nurse  Labs/tests ordered:  TSH

## 2022-03-25 LAB — TSH: TSH: 3.24 (ref 0.41–5.90)

## 2022-04-28 ENCOUNTER — Encounter: Payer: Self-pay | Admitting: Adult Health

## 2022-04-28 ENCOUNTER — Non-Acute Institutional Stay (SKILLED_NURSING_FACILITY): Payer: Medicare Other | Admitting: Adult Health

## 2022-04-28 DIAGNOSIS — I480 Paroxysmal atrial fibrillation: Secondary | ICD-10-CM | POA: Diagnosis not present

## 2022-04-28 DIAGNOSIS — E785 Hyperlipidemia, unspecified: Secondary | ICD-10-CM | POA: Diagnosis not present

## 2022-04-28 DIAGNOSIS — F01C11 Vascular dementia, severe, with agitation: Secondary | ICD-10-CM

## 2022-04-28 DIAGNOSIS — M069 Rheumatoid arthritis, unspecified: Secondary | ICD-10-CM

## 2022-04-28 DIAGNOSIS — M810 Age-related osteoporosis without current pathological fracture: Secondary | ICD-10-CM | POA: Diagnosis not present

## 2022-04-28 DIAGNOSIS — F333 Major depressive disorder, recurrent, severe with psychotic symptoms: Secondary | ICD-10-CM

## 2022-04-28 NOTE — Progress Notes (Unsigned)
Location:  [facility] Room Number: 50 A Place of Service:  SNF (31) Provider:  Durenda Age, DNP, FNP-BC  Patient Care Team: Virgie Dad, MD as PCP - General (Internal Medicine) Pichardo-Geisinger, Mila Palmer, MD as Referring Physician Nestor Ramp, Effie Shy, MD as Referring Physician (Ophthalmology)  Extended Emergency Contact Information Primary Emergency Contact: Key, Tim Address: [address]          [place], [place] [address] [name] of [place] Phone: [phone] Relation: Nephew Secondary Emergency Contact: Owens Shark Address: [address]          [place], [place] [address] [name] of [place] Phone: [phone] Mobile Phone: [phone] Relation: Other  Code Status:  Full Code  Goals of care: Advanced Directive information    03/24/2022    2:30 PM  Advanced Directives  Does Patient Have a Medical Advance Directive? Yes  Type of Advance Directive Living will  Does patient want to make changes to medical advance directive? No - Patient declined  Pre-existing out of facility DNR order (yellow form or pink MOST form) Pink MOST form placed in chart (order not valid for inpatient use)     Chief Complaint  Patient presents with   Medical Management of Chronic Issues    Routine Visit    HPI:  Pt is a 88 y.o. female seen today for medical management of chronic diseases. She is a long-term care resident of [facility].  PAF (paroxysmal atrial fibrillation) (HCC) -  takes Eliquis 2.5 mg twice a day   Hyperlipidemia, unspecified hyperlipidemia type -takes pravastatin 20 mg at bedtime  Rheumatoid arthritis, involving unspecified site, unspecified whether rheumatoid factor present ([place]) -   takes methotrexate 15 mg every Friday  Osteoporosis without current pathological fracture, unspecified osteoporosis type -   takes Prolia 60 mg SQ Q 6 months  Depression, major, recurrent,  severe with psychosis ([place])  -  PHQ-9 score 1, takes Zoloft 50 mg daily and Seroquel 12.5 mg twice a day  Severe vascular dementia with agitation (HCC) -  BIMS score 4/15, takes Namenda 10 mg twice a day    Past Medical History:  Diagnosis Date   Acid reflux disease    Arthritis    rheumatoid   Atrial fibrillation (HCC)    Back pain    Bursitis of hip, right    Cancer (HCC)    skin   Chest pain    Confusion    Constipation    Fuchs' corneal dystrophy    GERD (gastroesophageal reflux disease)    Hard of hearing    Knee pain    Memory loss    Paranoia ([place])    Shoulder pain    Venous insufficiency    Past Surgical History:  Procedure Laterality Date   CORNEAL TRANSPLANT  2012   Dr Maudie Mercury    RECONSTRUCTION OF EYELID     eyelid cancer 10 years ago Dr Victorino Dike    RECONSTRUCTION OF NOSE     nose cancer/ Dr Nyoka Cowden 10 years ago     Allergies  Allergen Reactions   Novocain [Procaine] Other (See Comments)    Unknown reaction   Sulfamethoxazole-Trimethoprim Other (See Comments)    Unknown reaction   Tape Other (See Comments)    Adhesive tape - unknown reaction   Codeine Other (See Comments)    Unknown reaction   Neosporin [Neomycin-Bacitracin Zn-Polymyx] Other (See Comments)    Unknown reaction    Outpatient Encounter  Medications as of 04/28/2022  Medication Sig   acetaminophen (TYLENOL) 325 MG tablet Take 650 mg by mouth every 6 (six) hours as needed.   apixaban (ELIQUIS) 2.5 MG TABS tablet Take 2.5 mg by mouth 2 (two) times daily.   denosumab (PROLIA) 60 MG/ML SOSY injection Inject 60 mg into the skin every 6 (six) months. On the 22nd of reach 6th month   Emollient (AQUAPHOR ADV PROTECT HEALING) 41 % OINT Apply 1 application topically daily.   folic acid (FOLVITE) 1 MG tablet Take 1 mg by mouth daily.   lactose free nutrition (BOOST PLUS) LIQD Take 237 mLs by mouth daily.   Lidocaine 4 % PTCH Apply topically. Apply to lower back Q 24hrs Once A Day   memantine  (NAMENDA) 10 MG tablet Take 10 mg by mouth 2 (two) times daily.    methotrexate (RHEUMATREX) 2.5 MG tablet Take 15 mg by mouth every Friday. Caution:Chemotherapy. Protect from light.   Multiple Vitamin-Folic Acid TABS Take 1 tablet by mouth daily. + B12   Polyethyl Glycol-Propyl Glycol (SYSTANE) 0.4-0.3 % SOLN Place 1 drop into both eyes in the morning and at bedtime.    polyethylene glycol (MIRALAX / GLYCOLAX) 17 g packet Take 17 g by mouth every other day.   pravastatin (PRAVACHOL) 20 MG tablet Take 20 mg by mouth at bedtime.    prednisoLONE acetate (PRED FORTE) 1 % ophthalmic suspension Place 1 drop into the right eye daily.   QUEtiapine (SEROQUEL) 25 MG tablet Take 12.5 mg by mouth 2 (two) times daily.   senna (SENOKOT) 8.6 MG tablet Take 1 tablet by mouth at bedtime.   sertraline (ZOLOFT) 50 MG tablet Take 50 mg by mouth daily.    white petrolatum (VASELINE) GEL Apply 1 application. topically daily. Apply to nose and bilateral temples   No facility-administered encounter medications on file as of 04/28/2022.    Review of Systems ***    Immunization History  Administered Date(s) Administered   Influenza, High Dose Seasonal PF 10/02/2013, 08/25/2015, 09/03/2016, 09/05/2017, 09/12/2018, 08/25/2019   Influenza-Unspecified 10/01/2014, 09/03/2020, 09/09/2021   Moderna SARS-COV2 Booster Vaccination 04/21/2021   Moderna Sars-Covid-2 Vaccination 11/24/2019, 12/22/2019, 09/30/2020, 04/21/2021   Pneumococcal Conjugate-13 09/30/2014, 10/21/2014   Pneumococcal Polysaccharide-23 01/23/2018   Td 04/18/2013   Unspecified SARS-COV-2 Vaccination 11/04/2021   Zoster, Live 09/04/2012   Pertinent  Health Maintenance Due  Topic Date Due   DEXA SCAN  Never done   INFLUENZA VACCINE  06/22/2022      07/22/2020    8:00 AM 07/22/2020    9:30 PM 07/23/2020    8:00 AM 07/23/2020    7:30 PM 07/24/2020    7:25 AM  Fall Risk  Patient Fall Risk Level High fall risk High fall risk High fall risk High fall risk  High fall risk     Vitals:   04/28/22 1557  BP: (!) 150/74  Pulse: 74  Resp: (!) 21  Temp: (!) 96.8 F (36 C)  Weight: 171 lb (77.6 kg)  Height: '5\' 6"'$  (1.676 m)   Body mass index is 27.6 kg/m.  Physical Exam     Labs reviewed: Recent Labs    07/10/21 0000 08/28/21 0000 02/12/22 0000  NA 142 139 142  K 4.2 4.5 4.6  CL 105 102 105  CO2 29* 31* 23*  BUN '20 17 17  '$ CREATININE 1.0 1.0 1.1  CALCIUM 10.2 9.8 9.5   Recent Labs    07/10/21 0000 08/28/21 0000 02/12/22 0000  AST 15  15 16  ALT 6* 9 8  ALKPHOS 55 75 51  ALBUMIN 3.3* 4.1 3.4*   Recent Labs    05/28/21 0000 07/10/21 0000 08/28/21 0000 02/12/22 0000  WBC 6.1 5.9 5.7 5.4  NEUTROABS 2,946.00 2,614.00 2,827.00  --   HGB 11.5* 10.9* 12.0 11.2*  HCT 35* 34* 36 35*  PLT 220 186 286 200   Lab Results  Component Value Date   TSH 3.70 07/10/2021   No results found for: HGBA1C Lab Results  Component Value Date   CHOL 136 02/12/2022   HDL 42 02/12/2022   LDLCALC 74 02/12/2022   TRIG 112 02/12/2022    Significant Diagnostic Results in last 30 days:  No results found.  Assessment/Plan ***   Family/ staff Communication: Discussed plan of care with resident and charge nurse  Labs/tests ordered:     Durenda Age, DNP, MSN, FNP-BC [facility] [phone] (Monday-Friday 8:00 a.m. - 5:00 p.m.) [phone] (after hours)

## 2022-07-13 ENCOUNTER — Encounter: Payer: Self-pay | Admitting: Internal Medicine

## 2022-07-13 ENCOUNTER — Non-Acute Institutional Stay (SKILLED_NURSING_FACILITY): Payer: Medicare Other | Admitting: Internal Medicine

## 2022-07-13 DIAGNOSIS — M810 Age-related osteoporosis without current pathological fracture: Secondary | ICD-10-CM

## 2022-07-13 DIAGNOSIS — I480 Paroxysmal atrial fibrillation: Secondary | ICD-10-CM

## 2022-07-13 DIAGNOSIS — M069 Rheumatoid arthritis, unspecified: Secondary | ICD-10-CM

## 2022-07-13 DIAGNOSIS — F01C11 Vascular dementia, severe, with agitation: Secondary | ICD-10-CM

## 2022-07-13 DIAGNOSIS — F333 Major depressive disorder, recurrent, severe with psychotic symptoms: Secondary | ICD-10-CM | POA: Diagnosis not present

## 2022-07-13 NOTE — Progress Notes (Signed)
Location:  [facility] Room Number: 50A Place of Service:  SNF (31)  Provider:   Code Status:  Goals of Care:     07/13/2022    2:38 PM  Advanced Directives  Does Patient Have a Medical Advance Directive? Yes  Type of Advance Directive Living will;Out of facility DNR (pink MOST or yellow form)  Does patient want to make changes to medical advance directive? No - Patient declined  Pre-existing out of facility DNR order (yellow form or pink MOST form) Pink MOST form placed in chart (order not valid for inpatient use)     Chief Complaint  Patient presents with   Medical Management of Chronic Issues    Routine follow up   Immunizations    Shingrix vaccine, COVID vaccine and flu vaccine due   Quality Metric Gaps    Dexa scan due    HPI: Patient is a 88 y.o. female seen today for medical management of chronic diseases.    Lives in SNF  Patient has a history of PAF on Eliquis, GERD, Rheumatoid arthritis, Hyperlipidemia, Osteoporosis and Dementia with Anxiety and Behavior issues      She is stable. No new Nursing issues. No Behavior issues Her weight is stable Wheelchair dependent Has Skin lesion in her face concerning for Cancer But Family does not want anything aggressive No Falls Wt Readings from Last 3 Encounters:  07/13/22 174 lb 14.4 oz (79.3 kg)  04/28/22 171 lb (77.6 kg)  03/24/22 169 lb 8 oz (76.9 kg)   Past Medical History:  Diagnosis Date   Acid reflux disease    Arthritis    rheumatoid   Atrial fibrillation (HCC)    Back pain    Bursitis of hip, right    Cancer (HCC)    skin   Chest pain    Confusion    Constipation    Fuchs' corneal dystrophy    GERD (gastroesophageal reflux disease)    Hard of hearing    Knee pain    Memory loss    Paranoia (HCC)    Shoulder pain    Venous insufficiency     Past Surgical History:  Procedure Laterality Date   CORNEAL TRANSPLANT  2012   Dr Maudie Mercury    RECONSTRUCTION OF EYELID      eyelid cancer 10 years ago Dr Victorino Dike    RECONSTRUCTION OF NOSE     nose cancer/ Dr Nyoka Cowden 10 years ago     Allergies  Allergen Reactions   Novocain [Procaine] Other (See Comments)    Unknown reaction   Sulfamethoxazole-Trimethoprim Other (See Comments)    Unknown reaction   Tape Other (See Comments)    Adhesive tape - unknown reaction   Codeine Other (See Comments)    Unknown reaction   Neosporin [Neomycin-Bacitracin Zn-Polymyx] Other (See Comments)    Unknown reaction    Outpatient Encounter Medications as of 07/13/2022  Medication Sig   acetaminophen (TYLENOL) 325 MG tablet Take 650 mg by mouth every 6 (six) hours as needed.   apixaban (ELIQUIS) 2.5 MG TABS tablet Take 2.5 mg by mouth 2 (two) times daily.   denosumab (PROLIA) 60 MG/ML SOSY injection Inject 60 mg into the skin every 6 (six) months. On the 22nd of reach 6th month   Emollient (AQUAPHOR ADV PROTECT HEALING) 41 % OINT Apply 1 application topically daily.   folic acid (FOLVITE) 1 MG tablet Take 1 mg by mouth daily.   Lidocaine 4 % PTCH Apply  topically. Apply to lower back Q 24hrs Once A Day   memantine (NAMENDA) 10 MG tablet Take 10 mg by mouth 2 (two) times daily.    methotrexate (RHEUMATREX) 2.5 MG tablet Take 15 mg by mouth every Friday. Caution:Chemotherapy. Protect from light.   Multiple Vitamin-Folic Acid TABS Take 1 tablet by mouth daily. + B12   Polyethyl Glycol-Propyl Glycol (SYSTANE) 0.4-0.3 % SOLN Place 1 drop into both eyes in the morning and at bedtime.    polyethylene glycol (MIRALAX / GLYCOLAX) 17 g packet Take 17 g by mouth every other day.   pravastatin (PRAVACHOL) 20 MG tablet Take 20 mg by mouth at bedtime.    prednisoLONE acetate (PRED FORTE) 1 % ophthalmic suspension Place 1 drop into the right eye daily.   QUEtiapine (SEROQUEL) 25 MG tablet Take 12.5 mg by mouth 2 (two) times daily.   senna (SENOKOT) 8.6 MG tablet Take 1 tablet by mouth at bedtime.   sertraline (ZOLOFT) 50 MG tablet Take 50  mg by mouth daily.    white petrolatum (VASELINE) GEL Apply 1 application. topically daily. Apply to nose and bilateral temples   [DISCONTINUED] lactose free nutrition (BOOST PLUS) LIQD Take 237 mLs by mouth daily.   No facility-administered encounter medications on file as of 07/13/2022.    Review of Systems:  Review of Systems  Unable to perform ROS: Dementia    Health Maintenance  Topic Date Due   Zoster Vaccines- Shingrix (1 of 2) Never done   DEXA SCAN  Never done   COVID-19 Vaccine (6 - Moderna series) 12/30/2021   INFLUENZA VACCINE  06/22/2022   TETANUS/TDAP  04/19/2023   Pneumonia Vaccine 61+ Years old  Completed   HPV VACCINES  Aged Out    Physical Exam: Vitals:   07/13/22 1428  BP: (!) 146/76  Pulse: 68  Resp: 20  Temp: 98 F (36.7 C)  SpO2: 95%  Weight: 174 lb 14.4 oz (79.3 kg)  Height: '5\' 6"'$  (1.676 m)   Body mass index is 28.23 kg/m. Physical Exam Vitals reviewed.  Constitutional:      Appearance: Normal appearance.  HENT:     Head: Normocephalic.     Nose: Nose normal.     Mouth/Throat:     Mouth: Mucous membranes are moist.     Pharynx: Oropharynx is clear.  Eyes:     Pupils: Pupils are equal, round, and reactive to light.  Cardiovascular:     Rate and Rhythm: Normal rate and regular rhythm.     Pulses: Normal pulses.     Heart sounds: Normal heart sounds. No murmur heard. Pulmonary:     Effort: Pulmonary effort is normal.     Breath sounds: Normal breath sounds.  Abdominal:     General: Abdomen is flat. Bowel sounds are normal.     Palpations: Abdomen is soft.  Musculoskeletal:     Cervical back: Neck supple.     Comments: Mild edema Bilateral  Skin:    General: Skin is warm.  Neurological:     General: No focal deficit present.     Mental Status: She is alert.  Psychiatric:        Mood and Affect: Mood normal.        Thought Content: Thought content normal.     Labs reviewed: Basic Metabolic Panel: Recent Labs     08/28/21 0000 02/12/22 0000  NA 139 142  K 4.5 4.6  CL 102 105  CO2 31* 23*  BUN 17  17  CREATININE 1.0 1.1  CALCIUM 9.8 9.5   Liver Function Tests: Recent Labs    08/28/21 0000 02/12/22 0000  AST 15 16  ALT 9 8  ALKPHOS 75 51  ALBUMIN 4.1 3.4*   No results for input(s): "LIPASE", "AMYLASE" in the last 8760 hours. No results for input(s): "AMMONIA" in the last 8760 hours. CBC: Recent Labs    08/28/21 0000 02/12/22 0000  WBC 5.7 5.4  NEUTROABS 2,827.00  --   HGB 12.0 11.2*  HCT 36 35*  PLT 286 200   Lipid Panel: Recent Labs    02/12/22 0000  CHOL 136  HDL 42  LDLCALC 74  TRIG 112   No results found for: "HGBA1C"  Procedures since last visit: No results found.  Assessment/Plan 1. PAF (paroxysmal atrial fibrillation) (HCC) On Eliquis  2. Rheumatoid arthritis, involving unspecified site, unspecified whether rheumatoid factor present ([place]) Methotrexate Recent labs good 3. Osteoporosis without current pathological fracture, unspecified osteoporosis type Prolia  4. Depression, major, recurrent, severe with psychosis ([place]) Zoloft  5. Severe vascular dementia with agitation (HCC) Namenda and Seroquel Failed  taper MMSE 18/30   Labs/tests ordered:  * No order type specified * Next appt:  Visit date not found

## 2022-07-27 ENCOUNTER — Encounter: Payer: Self-pay | Admitting: Nurse Practitioner

## 2022-07-27 ENCOUNTER — Non-Acute Institutional Stay (SKILLED_NURSING_FACILITY): Payer: Medicare Other | Admitting: Nurse Practitioner

## 2022-07-27 DIAGNOSIS — F01C11 Vascular dementia, severe, with agitation: Secondary | ICD-10-CM

## 2022-07-27 DIAGNOSIS — I480 Paroxysmal atrial fibrillation: Secondary | ICD-10-CM

## 2022-07-27 DIAGNOSIS — M810 Age-related osteoporosis without current pathological fracture: Secondary | ICD-10-CM | POA: Diagnosis not present

## 2022-07-27 DIAGNOSIS — M069 Rheumatoid arthritis, unspecified: Secondary | ICD-10-CM | POA: Diagnosis not present

## 2022-07-27 DIAGNOSIS — R269 Unspecified abnormalities of gait and mobility: Secondary | ICD-10-CM

## 2022-07-27 DIAGNOSIS — E785 Hyperlipidemia, unspecified: Secondary | ICD-10-CM

## 2022-07-27 DIAGNOSIS — F418 Other specified anxiety disorders: Secondary | ICD-10-CM

## 2022-07-27 DIAGNOSIS — K5901 Slow transit constipation: Secondary | ICD-10-CM

## 2022-07-27 DIAGNOSIS — D638 Anemia in other chronic diseases classified elsewhere: Secondary | ICD-10-CM

## 2022-07-27 DIAGNOSIS — R635 Abnormal weight gain: Secondary | ICD-10-CM

## 2022-07-27 DIAGNOSIS — S32050D Wedge compression fracture of fifth lumbar vertebra, subsequent encounter for fracture with routine healing: Secondary | ICD-10-CM

## 2022-07-27 NOTE — Progress Notes (Signed)
Location:   [facility] Room Number: [place] of Service:  SNF (31) Provider:  Ed Rayson, NP  PCP: Virgie Dad, MD  Patient Care Team: Virgie Dad, MD as PCP - General (Internal Medicine) Pichardo-Geisinger, Mila Palmer, MD as Referring Physician Nestor Ramp, Effie Shy, MD as Referring Physician (Ophthalmology)  Extended Emergency Contact Information Primary Emergency Contact: Key, Tim Address: [address]          [place], [place] [address] [name] of [place] Phone: [phone] Relation: [name] Secondary Emergency Contact: Owens Shark Address: [address]          [place], [place] [address] [name] of [place] Phone: [phone] Mobile Phone: [phone] Relation: Other  Code Status: FULL CODE  Goals of care: Advanced Directive information    07/27/2022   11:40 AM  Advanced Directives  Does Patient Have a Medical Advance Directive? Yes  Type of Advance Directive Living will  Does patient want to make changes to medical advance directive? No - Patient declined     Chief Complaint  Patient presents with  . Medical Management of Chronic Issues    Routine Visit.   Marland Kitchen Health Maintenance    Discuss the need for Dexa scan.   . Immunizations    Discuss the need for Shingrix vaccine, and Covid Booster.     HPI:  Pt is a 88 y.o. female seen today for medical management of chronic diseases.      Gait abnormality, uses w/c for mobility, risk of falling             L5 fx sustained from fall, 07/19/20, takes Tylenol, uses w/c for mobility.              Constipation, takes Senna, MiraLax               Dementia,  takes Memantine             Afib, takes Eliquis             Depression/anxiety, takes Sertraline, Quetiapine,  off Buspar 09/26/20, TSH 3.70 07/10/21             RA, takes Methotrexate '15mg'$  wkly, also takes Tylenol qid.              OP, takes Prolia, Ca, Vit D, due DEXA              Anemia, Fe 66, Vit B12 1010  9/47/09, takes Folic acid, Hgb 62.8 3/66/29             Hyperlipidemia, takes Pravastatin, LDL 74 02/12/22     Past Medical History:  Diagnosis Date  . Acid reflux disease   . Arthritis    rheumatoid  . Atrial fibrillation ([place])   . Back pain   . Bursitis of hip, right   . Cancer ([place])    skin  . Chest pain   . Confusion   . Constipation   . Fuchs' corneal dystrophy   . GERD (gastroesophageal reflux disease)   . Hard of hearing   . Knee pain   . Memory loss   . Paranoia ([place])   . Shoulder pain   . Venous insufficiency    Past Surgical History:  Procedure Laterality Date  . CORNEAL TRANSPLANT  2012   Dr Maudie Mercury   . RECONSTRUCTION OF EYELID     eyelid cancer 10 years ago Dr Victorino Dike   . RECONSTRUCTION OF NOSE  nose cancer/ Dr Nyoka Cowden 10 years ago     Allergies  Allergen Reactions  . Novocain [Procaine] Other (See Comments)    Unknown reaction  . Sulfamethoxazole-Trimethoprim Other (See Comments)    Unknown reaction  . Tape Other (See Comments)    Adhesive tape - unknown reaction  . Codeine Other (See Comments)    Unknown reaction  . Neosporin [Neomycin-Bacitracin Zn-Polymyx] Other (See Comments)    Unknown reaction    Allergies as of 07/27/2022       Reactions   Novocain [procaine] Other (See Comments)   Unknown reaction   Sulfamethoxazole-trimethoprim Other (See Comments)   Unknown reaction   Tape Other (See Comments)   Adhesive tape - unknown reaction   Codeine Other (See Comments)   Unknown reaction   Neosporin [neomycin-bacitracin Zn-polymyx] Other (See Comments)   Unknown reaction        Medication List        Accurate as of July 27, 2022 11:59 PM. If you have any questions, ask your nurse or doctor.          STOP taking these medications    white petrolatum Gel Commonly known as: VASELINE Stopped by: Shericka Johnstone X Alanys Godino, NP       TAKE these medications    acetaminophen 325 MG tablet Commonly known as: TYLENOL Take 650 mg by mouth  every 6 (six) hours as needed.   apixaban 2.5 MG Tabs tablet Commonly known as: ELIQUIS Take 2.5 mg by mouth 2 (two) times daily.   Aquaphor Adv Protect Healing 41 % Oint Apply 1 application topically daily.   denosumab 60 MG/ML Sosy injection Commonly known as: PROLIA Inject 60 mg into the skin every 6 (six) months. On the 22nd of reach 6th month   fluorouracil 5 % cream Commonly known as: EFUDEX Apply 1 Application topically 2 (two) times daily. Apply thin layer to marker areas. Right temple, nose/nasolabial fold, and mid chest twice daily for 3 weeks.   folic acid 1 MG tablet Commonly known as: FOLVITE Take 1 mg by mouth daily.   lidocaine 4 % Apply 1 patch topically as needed. Apply to lower back Q 24hrs Once A Day   memantine 10 MG tablet Commonly known as: NAMENDA Take 10 mg by mouth 2 (two) times daily.   methotrexate 2.5 MG tablet Commonly known as: RHEUMATREX Take 15 mg by mouth every Friday. Caution:Chemotherapy. Protect from light.   Multiple Vitamin-Folic Acid Tabs Take 1 tablet by mouth daily. + B12   polyethylene glycol 17 g packet Commonly known as: MIRALAX / GLYCOLAX Take 17 g by mouth every other day.   pravastatin 20 MG tablet Commonly known as: PRAVACHOL Take 20 mg by mouth at bedtime.   prednisoLONE acetate 1 % ophthalmic suspension Commonly known as: PRED FORTE Place 1 drop into the right eye daily.   QUEtiapine 25 MG tablet Commonly known as: SEROQUEL Take 12.5 mg by mouth 2 (two) times daily.   senna 8.6 MG tablet Commonly known as: SENOKOT Take 1 tablet by mouth at bedtime.   sertraline 50 MG tablet Commonly known as: ZOLOFT Take 50 mg by mouth daily.   Systane 0.4-0.3 % Soln Generic drug: Polyethyl Glycol-Propyl Glycol Place 1 drop into both eyes in the morning and at bedtime.        Review of Systems  Unable to perform ROS: Dementia    Immunization History  Administered Date(s) Administered  . Influenza, High Dose  Seasonal PF 10/02/2013, 08/25/2015, 09/03/2016,  09/05/2017, 09/12/2018, 08/25/2019  . Influenza-Unspecified 10/01/2014, 09/03/2020, 09/09/2021  . Moderna SARS-COV2 Booster Vaccination 04/21/2021  . Moderna Sars-Covid-2 Vaccination 11/24/2019, 12/22/2019, 09/30/2020, 04/21/2021, 04/09/2022  . Pneumococcal Conjugate-13 09/30/2014, 10/21/2014  . Pneumococcal Polysaccharide-23 01/23/2018  . Td 04/18/2013  . Unspecified SARS-COV-2 Vaccination 11/04/2021  . Zoster, Live 09/04/2012   Pertinent  Health Maintenance Due  Topic Date Due  . DEXA SCAN  Never done  . INFLUENZA VACCINE  06/22/2022      07/22/2020    8:00 AM 07/22/2020    9:30 PM 07/23/2020    8:00 AM 07/23/2020    7:30 PM 07/24/2020    7:25 AM  Fall Risk  Patient Fall Risk Level High fall risk High fall risk High fall risk High fall risk High fall risk   Functional Status Survey:    Vitals:   07/27/22 1131  BP: 132/76  Pulse: 76  Resp: 16  Temp: 98 F (36.7 C)  SpO2: 97%  Weight: 182 lb 11.2 oz (82.9 kg)  Height: '5\' 6"'$  (1.676 m)   Body mass index is 29.49 kg/m. Physical Exam Vitals and nursing note reviewed.  Constitutional:      Appearance: Normal appearance.  HENT:     Head: Normocephalic and atraumatic.  Eyes:     Extraocular Movements: Extraocular movements intact.     Conjunctiva/sclera: Conjunctivae normal.     Pupils: Pupils are equal, round, and reactive to light.  Cardiovascular:     Rate and Rhythm: Normal rate and regular rhythm.  Pulmonary:     Effort: Pulmonary effort is normal.     Breath sounds: No rales.  Abdominal:     General: Bowel sounds are normal.     Palpations: Abdomen is soft.     Tenderness: There is no abdominal tenderness.     Comments: Right inguinal hernia, reducible, a tennis ball sized from previous examination.   Musculoskeletal:     Cervical back: Normal range of motion and neck supple.     Right lower leg: No edema.     Left lower leg: No edema.     Comments: Lower back  pain is better.   Skin:    General: Skin is warm and dry.  Neurological:     General: No focal deficit present.     Mental Status: She is alert. Mental status is at baseline.     Gait: Gait abnormal.     Comments: Oriented to person, place.   Psychiatric:        Mood and Affect: Mood normal.        Behavior: Behavior normal.     Comments: Pleasantly confused.     Labs reviewed: Recent Labs    08/28/21 0000 02/12/22 0000  NA 139 142  K 4.5 4.6  CL 102 105  CO2 31* 23*  BUN 17 17  CREATININE 1.0 1.1  CALCIUM 9.8 9.5   Recent Labs    08/28/21 0000 02/12/22 0000  AST 15 16  ALT 9 8  ALKPHOS 75 51  ALBUMIN 4.1 3.4*   Recent Labs    08/28/21 0000 02/12/22 0000  WBC 5.7 5.4  NEUTROABS 2,827.00  --   HGB 12.0 11.2*  HCT 36 35*  PLT 286 200   Lab Results  Component Value Date   TSH 3.70 07/10/2021   No results found for: "HGBA1C" Lab Results  Component Value Date   CHOL 136 02/12/2022   HDL 42 02/12/2022   LDLCALC 74 02/12/2022   TRIG  112 02/12/2022    Significant Diagnostic Results in last 30 days:  No results found.  Assessment/Plan Rheumatoid arthritis ([place]) takes Methotrexate '15mg'$  wkly, also takes Tylenol qid.   Osteoporosis  takes Prolia, Ca, Vit D, due DEXA   Anemia, chronic disease  Fe 66, Vit B12 1010 0/34/74, takes Folic acid, Hgb 25.9 5/63/87  Hyperlipidemia  takes Pravastatin, LDL 74 02/12/22  Depression with anxiety Her mood is stable,  takes Sertraline, Quetiapine,  off Buspar 09/26/20, TSH 3.70 07/10/21  PAF (paroxysmal atrial fibrillation) (HCC) Heart rate is in control, takes Eliquis  Vascular dementia ([place]) SNF [facility] for supportive care, takes Memantine.   Slow transit constipation Stable,  takes Senna, MiraLax   L5 vertebral fracture ([place])  L5 fx sustained from fall, 07/19/20, takes Tylenol, uses w/c for mobility.   Gait abnormality  uses w/c for mobility, risk of falling     Family/ staff Communication: plan of care  reviewed with the patient and charge nurse.   Labs/tests ordered:  none   Time spend 35 minutes.

## 2022-08-02 NOTE — Assessment & Plan Note (Signed)
Her mood is stable, takes Sertraline, Quetiapine,  off Buspar 09/26/20, TSH 3.70 07/10/21  

## 2022-08-02 NOTE — Assessment & Plan Note (Signed)
Heart rate is in control, takes Eliquis  

## 2022-08-02 NOTE — Assessment & Plan Note (Signed)
SNF [facility] for supportive care, takes Memantine.

## 2022-08-02 NOTE — Assessment & Plan Note (Signed)
Stable, takes Senna, MiraLax.  

## 2022-08-02 NOTE — Assessment & Plan Note (Signed)
uses w/c for mobility, risk of falling 

## 2022-08-02 NOTE — Assessment & Plan Note (Signed)
L5 fx sustained from fall, 07/19/20, takes Tylenol, uses w/c for mobility.  

## 2022-08-02 NOTE — Assessment & Plan Note (Signed)
Fe 66, Vit B12 1010 1/74/71, takes Folic acid, Hgb 59.5 3/96/72

## 2022-08-02 NOTE — Assessment & Plan Note (Signed)
takes Pravastatin, LDL 74 02/12/22                

## 2022-08-02 NOTE — Assessment & Plan Note (Signed)
takes Prolia, Ca, Vit D, due DEXA

## 2022-08-02 NOTE — Assessment & Plan Note (Signed)
takes Methotrexate 15mg wkly, also takes Tylenol qid.   

## 2022-08-03 ENCOUNTER — Encounter: Payer: Self-pay | Admitting: Nurse Practitioner

## 2022-08-03 NOTE — Assessment & Plan Note (Signed)
#  11Ibs weight gained in the past 3 months, no apparent fluid retention, will update CBC/diff, CMP/eGFR, TSH, following dieatary

## 2022-08-05 LAB — CBC: RBC: 3.48 — AB (ref 3.87–5.11)

## 2022-08-05 LAB — BASIC METABOLIC PANEL
BUN: 16 (ref 4–21)
CO2: 29 — AB (ref 13–22)
Chloride: 106 (ref 99–108)
Creatinine: 1.1 (ref 0.5–1.1)
Glucose: 83
Potassium: 4.1 mEq/L (ref 3.5–5.1)
Sodium: 140 (ref 137–147)

## 2022-08-05 LAB — CBC AND DIFFERENTIAL
HCT: 33 — AB (ref 36–46)
Hemoglobin: 11.1 — AB (ref 12.0–16.0)
Neutrophils Absolute: 2918
Platelets: 190 10*3/uL (ref 150–400)
WBC: 5.6

## 2022-08-05 LAB — COMPREHENSIVE METABOLIC PANEL
Albumin: 3.5 (ref 3.5–5.0)
Calcium: 9.8 (ref 8.7–10.7)
Globulin: 2.3
eGFR: 48

## 2022-08-05 LAB — HEPATIC FUNCTION PANEL
ALT: 8 U/L (ref 7–35)
AST: 15 (ref 13–35)
Alkaline Phosphatase: 53 (ref 25–125)
Bilirubin, Total: 0.6

## 2022-08-05 LAB — HEMOGLOBIN A1C: Hemoglobin A1C: 11.1

## 2022-08-05 LAB — TSH: TSH: 4.1 (ref 0.41–5.90)

## 2022-08-24 ENCOUNTER — Encounter: Payer: Self-pay | Admitting: Nurse Practitioner

## 2022-08-24 ENCOUNTER — Non-Acute Institutional Stay (SKILLED_NURSING_FACILITY): Payer: Medicare Other | Admitting: Nurse Practitioner

## 2022-08-24 DIAGNOSIS — M069 Rheumatoid arthritis, unspecified: Secondary | ICD-10-CM

## 2022-08-24 DIAGNOSIS — F01C11 Vascular dementia, severe, with agitation: Secondary | ICD-10-CM | POA: Diagnosis not present

## 2022-08-24 DIAGNOSIS — F418 Other specified anxiety disorders: Secondary | ICD-10-CM

## 2022-08-24 DIAGNOSIS — E785 Hyperlipidemia, unspecified: Secondary | ICD-10-CM

## 2022-08-24 DIAGNOSIS — R269 Unspecified abnormalities of gait and mobility: Secondary | ICD-10-CM

## 2022-08-24 DIAGNOSIS — M810 Age-related osteoporosis without current pathological fracture: Secondary | ICD-10-CM

## 2022-08-24 DIAGNOSIS — I480 Paroxysmal atrial fibrillation: Secondary | ICD-10-CM

## 2022-08-24 DIAGNOSIS — S32050D Wedge compression fracture of fifth lumbar vertebra, subsequent encounter for fracture with routine healing: Secondary | ICD-10-CM

## 2022-08-24 DIAGNOSIS — D638 Anemia in other chronic diseases classified elsewhere: Secondary | ICD-10-CM

## 2022-08-24 NOTE — Assessment & Plan Note (Signed)
Mood is stabilized,  takes Sertraline, Quetiapine,  off Buspar 09/26/20, TSH 3.70 07/10/21

## 2022-08-24 NOTE — Assessment & Plan Note (Signed)
Heart rate is in control, takes Eliquis  

## 2022-08-24 NOTE — Assessment & Plan Note (Signed)
L5 fx sustained from fall, 07/19/20, takes Tylenol, uses w/c for mobility.  

## 2022-08-24 NOTE — Progress Notes (Signed)
Location:   SNF [place] Room Number: NO/50/A Place of Service:  SNF (31) Provider: [facility] Avanna Sowder NP  Virgie Dad, MD  Patient Care Team: Virgie Dad, MD as PCP - General (Internal Medicine) Pichardo-Geisinger, Mila Palmer, MD as Referring Physician Nestor Ramp, Effie Shy, MD as Referring Physician (Ophthalmology)  Extended Emergency Contact Information Primary Emergency Contact: Key, Tim Address: [address]          [place], [place] [address] [name] of [place] Phone: [phone] Relation: Nephew Secondary Emergency Contact: Owens Shark Address: [address]          [place], [place] [address] [name] of [place] Phone: [phone] Mobile Phone: [phone] Relation: Other  Code Status:  Full Goals of care: Advanced Directive information    08/24/2022    2:35 PM  Advanced Directives  Does Patient Have a Medical Advance Directive? Yes  Type of Advance Directive Living will  Does patient want to make changes to medical advance directive? No - Patient declined     Chief Complaint  Patient presents with   Medical Management of Chronic Issues    Patient is here for a follow up for chronic conditions Patient is due for dexa scan covid, flu, and shingrix vaccine    HPI:  Pt is a 88 y.o. female seen today for medical management of chronic diseases.       Gait abnormality, uses w/c for mobility, risk of falling             L5 fx sustained from fall, 07/19/20, takes Tylenol, uses w/c for mobility.              Constipation, takes Senna, MiraLax               Dementia,  takes Memantine, SNF [facility] for supportive care, TSH 4.1 08/05/22             Afib, takes Eliquis             Depression/anxiety, takes Sertraline, Quetiapine,  off Buspar 09/26/20, TSH 3.70 07/10/21             RA, takes Methotrexate '15mg'$  wkly, also takes Tylenol qid.              OP, takes Prolia, Ca, Vit D, due DEXA              Anemia, Fe 66, Vit B12 1010 2/63/78,  takes Folic acid, Hgb 58.8 03/23/76             Hyperlipidemia, takes Pravastatin, LDL 74 02/12/22 Past Medical History:  Diagnosis Date   Acid reflux disease    Arthritis    rheumatoid   Atrial fibrillation (HCC)    Back pain    Bursitis of hip, right    Cancer (HCC)    skin   Chest pain    Confusion    Constipation    Fuchs' corneal dystrophy    GERD (gastroesophageal reflux disease)    Hard of hearing    Knee pain    Memory loss    Paranoia (HCC)    Shoulder pain    Venous insufficiency    Past Surgical History:  Procedure Laterality Date   CORNEAL TRANSPLANT  2012   Dr Maudie Mercury    RECONSTRUCTION OF EYELID     eyelid cancer 10 years ago Dr Victorino Dike    RECONSTRUCTION OF NOSE     nose cancer/ Dr Nyoka Cowden 10 years ago  Allergies  Allergen Reactions   Novocain [Procaine] Other (See Comments)    Unknown reaction   Sulfamethoxazole-Trimethoprim Other (See Comments)    Unknown reaction   Tape Other (See Comments)    Adhesive tape - unknown reaction   Codeine Other (See Comments)    Unknown reaction   Neosporin [Neomycin-Bacitracin Zn-Polymyx] Other (See Comments)    Unknown reaction    Allergies as of 08/24/2022       Reactions   Novocain [procaine] Other (See Comments)   Unknown reaction   Sulfamethoxazole-trimethoprim Other (See Comments)   Unknown reaction   Tape Other (See Comments)   Adhesive tape - unknown reaction   Codeine Other (See Comments)   Unknown reaction   Neosporin [neomycin-bacitracin Zn-polymyx] Other (See Comments)   Unknown reaction        Medication List        Accurate as of August 24, 2022 11:59 PM. If you have any questions, ask your nurse or doctor.          acetaminophen 325 MG tablet Commonly known as: TYLENOL Take 650 mg by mouth every 6 (six) hours as needed.   apixaban 2.5 MG Tabs tablet Commonly known as: ELIQUIS Take 2.5 mg by mouth 2 (two) times daily.   Aquaphor Adv Protect Healing 41 % Oint Apply 1  application topically daily.   denosumab 60 MG/ML Sosy injection Commonly known as: PROLIA Inject 60 mg into the skin every 6 (six) months. On the 22nd of reach 6th month   folic acid 1 MG tablet Commonly known as: FOLVITE Take 1 mg by mouth daily.   lidocaine 4 % Apply 1 patch topically as needed. Apply to lower back Q 24hrs Once A Day   memantine 10 MG tablet Commonly known as: NAMENDA Take 10 mg by mouth 2 (two) times daily.   methotrexate 2.5 MG tablet Commonly known as: RHEUMATREX Take 15 mg by mouth every Friday. Caution:Chemotherapy. Protect from light.   Multiple Vitamin-Folic Acid Tabs Take 1 tablet by mouth daily. + B12   polyethylene glycol 17 g packet Commonly known as: MIRALAX / GLYCOLAX Take 17 g by mouth every other day.   pravastatin 20 MG tablet Commonly known as: PRAVACHOL Take 20 mg by mouth at bedtime.   prednisoLONE acetate 1 % ophthalmic suspension Commonly known as: PRED FORTE Place 1 drop into the right eye daily.   QUEtiapine 25 MG tablet Commonly known as: SEROQUEL Take 12.5 mg by mouth 2 (two) times daily.   senna 8.6 MG tablet Commonly known as: SENOKOT Take 1 tablet by mouth at bedtime.   sertraline 50 MG tablet Commonly known as: ZOLOFT Take 50 mg by mouth daily.   Systane 0.4-0.3 % Soln Generic drug: Polyethyl Glycol-Propyl Glycol Place 1 drop into both eyes in the morning and at bedtime.        Review of Systems  Unable to perform ROS: Dementia    Immunization History  Administered Date(s) Administered   Influenza, High Dose Seasonal PF 10/02/2013, 08/25/2015, 09/03/2016, 09/05/2017, 09/12/2018, 08/25/2019   Influenza-Unspecified 10/01/2014, 09/03/2020, 09/09/2021   Moderna SARS-COV2 Booster Vaccination 04/21/2021   Moderna Sars-Covid-2 Vaccination 11/24/2019, 12/22/2019, 09/30/2020, 04/21/2021, 04/09/2022   Pneumococcal Conjugate-13 09/30/2014, 10/21/2014   Pneumococcal Polysaccharide-23 01/23/2018   Td 04/18/2013    Unspecified SARS-COV-2 Vaccination 11/04/2021   Zoster, Live 09/04/2012   Pertinent  Health Maintenance Due  Topic Date Due   DEXA SCAN  Never done   INFLUENZA VACCINE  06/22/2022  07/22/2020    9:30 PM 07/23/2020    8:00 AM 07/23/2020    7:30 PM 07/24/2020    7:25 AM 08/24/2022    2:35 PM  Fall Risk  Falls in the past year?     0  Was there an injury with Fall?     0  Fall Risk Category Calculator     0  Fall Risk Category     Low  Patient Fall Risk Level High fall risk High fall risk High fall risk High fall risk Low fall risk  Patient at Risk for Falls Due to     No Fall Risks  Fall risk Follow up     Falls evaluation completed   Functional Status Survey:    Vitals:   08/24/22 1433  BP: 115/70  Pulse: 64  Resp: 16  Temp: 98 F (36.7 C)  SpO2: 97%  Weight: 180 lb (81.6 kg)  Height: '5\' 6"'$  (1.676 m)   Body mass index is 29.05 kg/m. Physical Exam Vitals and nursing note reviewed.  Constitutional:      Appearance: Normal appearance.  HENT:     Head: Normocephalic and atraumatic.  Eyes:     Extraocular Movements: Extraocular movements intact.     Conjunctiva/sclera: Conjunctivae normal.     Pupils: Pupils are equal, round, and reactive to light.  Cardiovascular:     Rate and Rhythm: Normal rate and regular rhythm.  Pulmonary:     Effort: Pulmonary effort is normal.     Breath sounds: No rales.  Abdominal:     General: Bowel sounds are normal.     Palpations: Abdomen is soft.     Tenderness: There is no abdominal tenderness.     Comments: Right inguinal hernia, reducible, a tennis ball sized from previous examination.   Musculoskeletal:     Cervical back: Normal range of motion and neck supple.     Right lower leg: No edema.     Left lower leg: No edema.     Comments: Lower back pain is better.   Skin:    General: Skin is warm and dry.  Neurological:     General: No focal deficit present.     Mental Status: She is alert. Mental status is at baseline.      Gait: Gait abnormal.     Comments: Oriented to person, place.   Psychiatric:        Mood and Affect: Mood normal.        Behavior: Behavior normal.     Comments: Pleasantly confused.      Labs reviewed: Recent Labs    08/28/21 0000 02/12/22 0000  NA 139 142  K 4.5 4.6  CL 102 105  CO2 31* 23*  BUN 17 17  CREATININE 1.0 1.1  CALCIUM 9.8 9.5   Recent Labs    08/28/21 0000 02/12/22 0000  AST 15 16  ALT 9 8  ALKPHOS 75 51  ALBUMIN 4.1 3.4*   Recent Labs    08/28/21 0000 02/12/22 0000  WBC 5.7 5.4  NEUTROABS 2,827.00  --   HGB 12.0 11.2*  HCT 36 35*  PLT 286 200   Lab Results  Component Value Date   TSH 3.70 07/10/2021   No results found for: "HGBA1C" Lab Results  Component Value Date   CHOL 136 02/12/2022   HDL 42 02/12/2022   LDLCALC 74 02/12/2022   TRIG 112 02/12/2022    Significant Diagnostic Results in last 30 days:  No results found.  Assessment/Plan  Vascular dementia ([place]) takes Memantine, resides in SNF [facility] For supportive care, TSH 4.1 08/05/22  PAF (paroxysmal atrial fibrillation) (HCC) Heart rate is in control, takes Eliquis  Depression with anxiety Mood is stabilized,  takes Sertraline, Quetiapine,  off Buspar 09/26/20, TSH 3.70 07/10/21  Rheumatoid arthritis (HCC) Stable, takes Methotrexate '15mg'$  wkly, also takes Tylenol qid.   Osteoporosis takes Prolia, Ca, Vit D, due DEXA   Anemia, chronic disease Fe 66, Vit B12 1010 0/10/07, takes Folic acid, Hgb 12.1 9/75/88  Hyperlipidemia  takes Pravastatin, LDL 74 02/12/22  L5 vertebral fracture (HCC)   L5 fx sustained from fall, 07/19/20, takes Tylenol, uses w/c for mobility.   Gait abnormality uses w/c for mobility, risk of falling   Family/ staff Communication: plan of care reviewed with the patient and charge nurse.   Labs/tests ordered:  none  Time spend 35 minutes.

## 2022-08-24 NOTE — Assessment & Plan Note (Signed)
Stable, takes Methotrexate '15mg'$  wkly, also takes Tylenol qid.

## 2022-08-24 NOTE — Assessment & Plan Note (Signed)
takes Pravastatin, LDL 74 02/12/22                

## 2022-08-24 NOTE — Assessment & Plan Note (Addendum)
takes Memantine, resides in SNF [facility] For supportive care, TSH 4.1 08/05/22

## 2022-08-24 NOTE — Assessment & Plan Note (Signed)
Fe 66, Vit B12 1010 12/11/20, takes Folic acid, Hgb 11.1 08/05/22 

## 2022-08-24 NOTE — Assessment & Plan Note (Signed)
takes Prolia, Ca, Vit D, due DEXA

## 2022-08-24 NOTE — Assessment & Plan Note (Signed)
uses w/c for mobility, risk of falling 

## 2022-08-27 ENCOUNTER — Encounter: Payer: Self-pay | Admitting: Nurse Practitioner

## 2022-09-28 ENCOUNTER — Encounter: Payer: Self-pay | Admitting: Family Medicine

## 2022-09-28 ENCOUNTER — Non-Acute Institutional Stay (SKILLED_NURSING_FACILITY): Payer: Medicare Other | Admitting: Family Medicine

## 2022-09-28 DIAGNOSIS — I480 Paroxysmal atrial fibrillation: Secondary | ICD-10-CM | POA: Diagnosis not present

## 2022-09-28 DIAGNOSIS — R269 Unspecified abnormalities of gait and mobility: Secondary | ICD-10-CM

## 2022-09-28 DIAGNOSIS — M810 Age-related osteoporosis without current pathological fracture: Secondary | ICD-10-CM

## 2022-09-28 DIAGNOSIS — Z7901 Long term (current) use of anticoagulants: Secondary | ICD-10-CM

## 2022-09-28 DIAGNOSIS — E785 Hyperlipidemia, unspecified: Secondary | ICD-10-CM | POA: Diagnosis not present

## 2022-09-28 DIAGNOSIS — F01C11 Vascular dementia, severe, with agitation: Secondary | ICD-10-CM

## 2022-09-28 NOTE — Progress Notes (Signed)
Location:      Place of Service:     Provider: Alain Honey  PCP: Virgie Dad, MD Patient Care Team: Virgie Dad, MD as PCP - General (Internal Medicine) Pichardo-Geisinger, Mila Palmer, MD as Referring Physician Nestor Ramp, Effie Shy, MD as Referring Physician (Ophthalmology)  Extended Emergency Contact Information Primary Emergency Contact: Key, Tim Address: [address]          [place], [place] [address] [name] of [place] Phone: [phone] Relation: Nephew Secondary Emergency Contact: Owens Shark Address: [address]          [place], [place] [address] [name] of [place] Phone: [phone] Mobile Phone: [phone] Relation: Other  Code Status: DNR Goals of care:  Advanced Directive information    08/24/2022    2:35 PM  Advanced Directives  Does Patient Have a Medical Advance Directive? Yes  Type of Advance Directive Living will  Does patient want to make changes to medical advance directive? No - Patient declined     Allergies  Allergen Reactions   Novocain [Procaine] Other (See Comments)    Unknown reaction   Sulfamethoxazole-Trimethoprim Other (See Comments)    Unknown reaction   Tape Other (See Comments)    Adhesive tape - unknown reaction   Codeine Other (See Comments)    Unknown reaction   Neosporin [Neomycin-Bacitracin Zn-Polymyx] Other (See Comments)    Unknown reaction    No chief complaint on file.   HPI:  88 y.o. female resident at friend's home [facility] with chronic conditions including vascular dementia behaviors, rheumatoid arthritis, paroxysmal atrial fibrillation.  Nursing reports no new problems but she does have some behavioral issues typically at nighttime.  For that reason she has been given Seroquel, as well as Namenda. She continues to take methotrexate for her rheumatoid arthritis, Prolia for osteoporosis, and Eliquis for stroke prevention secondary to paroxysmal atrial fibs. Patient denies  any pains today.  She reports good appetite, no sleeping issues. She also takes pravastatin for lipids were last assessed 6 months ago with LDL at 74.    Past Medical History:  Diagnosis Date   Acid reflux disease    Arthritis    rheumatoid   Atrial fibrillation (HCC)    Back pain    Bursitis of hip, right    Cancer (HCC)    skin   Chest pain    Confusion    Constipation    Fuchs' corneal dystrophy    GERD (gastroesophageal reflux disease)    Hard of hearing    Knee pain    Memory loss    Paranoia (HCC)    Shoulder pain    Venous insufficiency     Past Surgical History:  Procedure Laterality Date   CORNEAL TRANSPLANT  2012   Dr Maudie Mercury    RECONSTRUCTION OF EYELID     eyelid cancer 10 years ago Dr Victorino Dike    RECONSTRUCTION OF NOSE     nose cancer/ Dr Nyoka Cowden 10 years ago       reports that she has never smoked. She has never used smokeless tobacco. She reports that she does not currently use alcohol. She reports that she does not currently use drugs. Social History   Socioeconomic History   Marital status: Widowed    Spouse name: Not on file   Number of children: Not on file   Years of education: Not on file   Highest education level: Not on file  Occupational History   Not on file  Tobacco Use   Smoking status: Never   Smokeless tobacco: Never  Vaping Use   Vaping Use: Never used  Substance and Sexual Activity   Alcohol use: Not Currently   Drug use: Not Currently   Sexual activity: Not on file  Other Topics Concern   Not on file  Social History Narrative   Social History      Diet? No restrictions       Do you drink/eat things with caffeine? Sometimes       Marital status?  Widow                                  What year were you married? 1943      Do you live in a house, apartment, assisted living, condo, trailer, etc.? Assisted Living      Is it one or more stories? YES       How many persons live in your home? 1      Do you have any pets  in your home? (please list) NO       Highest level of education completed?high school       Current or past profession: Clerical       Do you exercise? Little                                      Type & how often? Walking        Advanced Directive       Do you have a living will?YES      Do you have a DNR form? NO                                 If not, do you want to discuss one?      Do you have signed POA/HPOA for forms? YES      Functional Status      Do you have difficulty bathing or dressing yourself?YES      Do you have difficulty preparing food or eating? NO      Do you have difficulty managing your medications?YES      Do you have difficulty managing your finances?YES      Do you have difficulty affording your medications?NO      Social Determinants of Radio broadcast assistant Strain: Not on file  Food Insecurity: Not on file  Transportation Needs: Not on file  Physical Activity: Not on file  Stress: Not on file  Social Connections: Not on file  Intimate Partner Violence: Not on file   Functional Status Survey:    Allergies  Allergen Reactions   Novocain [Procaine] Other (See Comments)    Unknown reaction   Sulfamethoxazole-Trimethoprim Other (See Comments)    Unknown reaction   Tape Other (See Comments)    Adhesive tape - unknown reaction   Codeine Other (See Comments)    Unknown reaction   Neosporin [Neomycin-Bacitracin Zn-Polymyx] Other (See Comments)    Unknown reaction    Pertinent  Health Maintenance Due  Topic Date Due   DEXA SCAN  Never done   INFLUENZA VACCINE  06/22/2022    Medications: Outpatient Encounter Medications as of 09/28/2022  Medication Sig   acetaminophen (TYLENOL) 325 MG tablet Take 650 mg by  mouth every 6 (six) hours as needed.   apixaban (ELIQUIS) 2.5 MG TABS tablet Take 2.5 mg by mouth 2 (two) times daily.   denosumab (PROLIA) 60 MG/ML SOSY injection Inject 60 mg into the skin every 6 (six) months. On the 22nd of  reach 6th month   Emollient (AQUAPHOR ADV PROTECT HEALING) 41 % OINT Apply 1 application topically daily.   folic acid (FOLVITE) 1 MG tablet Take 1 mg by mouth daily.   Lidocaine 4 % PTCH Apply 1 patch topically as needed. Apply to lower back Q 24hrs Once A Day   memantine (NAMENDA) 10 MG tablet Take 10 mg by mouth 2 (two) times daily.    methotrexate (RHEUMATREX) 2.5 MG tablet Take 15 mg by mouth every Friday. Caution:Chemotherapy. Protect from light.   Multiple Vitamin-Folic Acid TABS Take 1 tablet by mouth daily. + B12   Polyethyl Glycol-Propyl Glycol (SYSTANE) 0.4-0.3 % SOLN Place 1 drop into both eyes in the morning and at bedtime.    polyethylene glycol (MIRALAX / GLYCOLAX) 17 g packet Take 17 g by mouth every other day.   pravastatin (PRAVACHOL) 20 MG tablet Take 20 mg by mouth at bedtime.    prednisoLONE acetate (PRED FORTE) 1 % ophthalmic suspension Place 1 drop into the right eye daily.   QUEtiapine (SEROQUEL) 25 MG tablet Take 12.5 mg by mouth 2 (two) times daily.   senna (SENOKOT) 8.6 MG tablet Take 1 tablet by mouth at bedtime.   sertraline (ZOLOFT) 50 MG tablet Take 50 mg by mouth daily.    No facility-administered encounter medications on file as of 09/28/2022.    Review of Systems  Unable to perform ROS: Dementia    Vitals:   09/28/22 1426  BP: 136/70  Pulse: 70  Temp: (!) 97.4 F (36.3 C)  Weight: 187 lb (84.8 kg)   Body mass index is 30.18 kg/m. Physical Exam Vitals and nursing note reviewed.  Constitutional:      Appearance: Normal appearance.  Eyes:     Extraocular Movements: Extraocular movements intact.     Pupils: Pupils are equal, round, and reactive to light.  Cardiovascular:     Rate and Rhythm: Normal rate and regular rhythm.     Heart sounds: Normal heart sounds.  Pulmonary:     Effort: Pulmonary effort is normal.     Breath sounds: Normal breath sounds.  Abdominal:     General: Bowel sounds are normal.     Palpations: Abdomen is soft.   Skin:    Comments: Multiple lesions on skin including face and arms probably some are squamous cell cancers  Neurological:     General: No focal deficit present.     Mental Status: She is alert.     Comments: Oriented to self but not place or time.  She did however point out several family photos around her room and tell me how people in the pictures were related to her  Psychiatric:        Mood and Affect: Mood normal.        Behavior: Behavior normal.     Labs reviewed: Basic Metabolic Panel: Recent Labs    02/12/22 0000  NA 142  K 4.6  CL 105  CO2 23*  BUN 17  CREATININE 1.1  CALCIUM 9.5   Liver Function Tests: Recent Labs    02/12/22 0000  AST 16  ALT 8  ALKPHOS 51  ALBUMIN 3.4*   No results for input(s): "LIPASE", "AMYLASE" in the  last 8760 hours. No results for input(s): "AMMONIA" in the last 8760 hours. CBC: Recent Labs    02/12/22 0000  WBC 5.4  HGB 11.2*  HCT 35*  PLT 200   Cardiac Enzymes: No results for input(s): "CKTOTAL", "CKMB", "CKMBINDEX", "TROPONINI" in the last 8760 hours. BNP: Invalid input(s): "POCBNP" CBG: No results for input(s): "GLUCAP" in the last 8760 hours.  Procedures and Imaging Studies During Stay: No results found.  Assessment/Plan:     1. Chronic anticoagulation Patient takes this for stroke prevention.  No reports of recent falls  2. Gait abnormality Is no longer ambulatory  3. Hyperlipidemia, unspecified hyperlipidemia type Last lipid check showed good LDL but at her age of 21 I do think some consideration to discontinuing statin would be appropriate  4. PAF (paroxysmal atrial fibrillation) ([place]) Patient appears to be in sinus rhythm today.  She is not requiring any rate controlling meds  5. Osteoporosis without current pathological fracture, unspecified osteoporosis type She continues with Prolia  6. Severe vascular dementia with agitation ([place]) Very pleasant today but apparently some behaviors occur after  the son goes down.  Medications for control include Seroquel and Namenda    Future labs/tests needed:  per NP  Lillette Boxer. Sabra Heck, [place] [address] [place], [place] Office [phone]

## 2022-10-29 ENCOUNTER — Encounter: Payer: Self-pay | Admitting: Nurse Practitioner

## 2022-10-29 ENCOUNTER — Non-Acute Institutional Stay (SKILLED_NURSING_FACILITY): Payer: Medicare Other | Admitting: Nurse Practitioner

## 2022-10-29 DIAGNOSIS — F01C11 Vascular dementia, severe, with agitation: Secondary | ICD-10-CM

## 2022-10-29 DIAGNOSIS — F418 Other specified anxiety disorders: Secondary | ICD-10-CM

## 2022-10-29 DIAGNOSIS — E785 Hyperlipidemia, unspecified: Secondary | ICD-10-CM

## 2022-10-29 DIAGNOSIS — R269 Unspecified abnormalities of gait and mobility: Secondary | ICD-10-CM | POA: Diagnosis not present

## 2022-10-29 DIAGNOSIS — M069 Rheumatoid arthritis, unspecified: Secondary | ICD-10-CM

## 2022-10-29 DIAGNOSIS — I480 Paroxysmal atrial fibrillation: Secondary | ICD-10-CM | POA: Diagnosis not present

## 2022-10-29 DIAGNOSIS — K5901 Slow transit constipation: Secondary | ICD-10-CM

## 2022-10-29 DIAGNOSIS — M810 Age-related osteoporosis without current pathological fracture: Secondary | ICD-10-CM

## 2022-10-29 DIAGNOSIS — D638 Anemia in other chronic diseases classified elsewhere: Secondary | ICD-10-CM

## 2022-10-29 DIAGNOSIS — S32050D Wedge compression fracture of fifth lumbar vertebra, subsequent encounter for fracture with routine healing: Secondary | ICD-10-CM

## 2022-10-29 NOTE — Assessment & Plan Note (Signed)
uses w/c for mobility, risk of falling

## 2022-10-29 NOTE — Assessment & Plan Note (Signed)
Fe 66, Vit B12 1010 12/11/20, takes Folic acid, Hgb 11.1 08/05/22 

## 2022-10-29 NOTE — Assessment & Plan Note (Signed)
Hear rate is in control,  takes Eliquis

## 2022-10-29 NOTE — Assessment & Plan Note (Signed)
Stable, takes Senna, MiraLax

## 2022-10-29 NOTE — Assessment & Plan Note (Signed)
L5 fx sustained from fall, 07/19/20, takes Tylenol, uses w/c for mobility.

## 2022-10-29 NOTE — Assessment & Plan Note (Signed)
Her mood is stable, takes Sertraline, Quetiapine,  off Buspar 09/26/20, TSH 3.70 07/10/21

## 2022-10-29 NOTE — Progress Notes (Unsigned)
Location:  [facility] Room Number: NO05-A Place of Service:  SNF (31) Provider: Margaruite Top X, NP  Patient Care Team: Mahlon Gammon, MD as PCP - General (Internal Medicine) Pichardo-Geisinger, Robby Sermon, MD as Referring Physician Michel Santee, Lorenza Evangelist, MD as Referring Physician (Ophthalmology)  Extended Emergency Contact Information Primary Emergency Contact: Key, Tim Address: [address]          [place], [place] [address] Darden Amber of [place] Phone: [phone] Relation: Nephew Secondary Emergency Contact: [place] [facility] Address: [address]          [place], [place] [address] Darden Amber of [place] Home Phone: [phone] Mobile Phone: [phone] Relation: Other  Code Status:  Full Code Goals of care: Advanced Directive information    10/29/2022    2:51 PM  Advanced Directives  Does Patient Have a Medical Advance Directive? Yes  Type of Advance Directive Living will;Out of facility DNR (pink MOST or yellow form);Healthcare Power of Attorney  Does patient want to make changes to medical advance directive? No - Patient declined  Copy of Healthcare Power of Attorney in Chart? Yes - validated most recent copy scanned in chart (See row information)  Would patient like information on creating a medical advance directive? No - Patient declined  Pre-existing out of facility DNR order (yellow form or pink MOST form) Pink MOST form placed in chart (order not valid for inpatient use);Yellow form placed in chart (order not valid for inpatient use)     Chief Complaint  Patient presents with   Medical Management of Chronic Issues    Routine Visit with the provider on site at [facility]   Quality Metric Gaps    Needs to Discuss Shingrix Vaccine & DEXA SCAN      HPI:  Pt is a 88 y.o. female seen today for medical management of chronic diseases.     Past Medical History:  Diagnosis Date   Acid reflux disease    Arthritis     rheumatoid   Atrial fibrillation (HCC)    Back pain    Bursitis of hip, right    Cancer (HCC)    skin   Chest pain    Confusion    Constipation    Fuchs' corneal dystrophy    GERD (gastroesophageal reflux disease)    Hard of hearing    Knee pain    Memory loss    Paranoia (HCC)    Shoulder pain    Venous insufficiency    Past Surgical History:  Procedure Laterality Date   CORNEAL TRANSPLANT  2012   Dr Selena Batten    RECONSTRUCTION OF EYELID     eyelid cancer 10 years ago Dr Nehemiah Settle    RECONSTRUCTION OF NOSE     nose cancer/ Dr Elonda Husky 10 years ago     Allergies  Allergen Reactions   Novocain [Procaine] Other (See Comments)    Unknown reaction   Sulfamethoxazole-Trimethoprim Other (See Comments)    Unknown reaction   Tape Other (See Comments)    Adhesive tape - unknown reaction   Codeine Other (See Comments)    Unknown reaction   Neosporin [Neomycin-Bacitracin Zn-Polymyx] Other (See Comments)    Unknown reaction    Outpatient Encounter Medications as of 10/29/2022  Medication Sig   acetaminophen (TYLENOL) 325 MG tablet Take 650 mg by mouth every 6 (six) hours as needed.   apixaban (ELIQUIS) 2.5 MG TABS tablet Take 2.5 mg by mouth 2 (two) times daily.  denosumab (PROLIA) 60 MG/ML SOSY injection Inject 60 mg into the skin every 6 (six) months. On the 22nd of reach 6th month   Emollient (AQUAPHOR ADV PROTECT HEALING) 41 % OINT Apply 1 application topically daily.   folic acid (FOLVITE) 1 MG tablet Take 1 mg by mouth daily.   Lidocaine 4 % PTCH Apply 1 patch topically as needed. Apply to lower back Q 24hrs Once A Day   memantine (NAMENDA) 10 MG tablet Take 10 mg by mouth 2 (two) times daily.    methotrexate (RHEUMATREX) 2.5 MG tablet Take 15 mg by mouth every Friday. Caution:Chemotherapy. Protect from light.   Multiple Vitamin-Folic Acid TABS Take 1 tablet by mouth daily. + B12   Polyethyl Glycol-Propyl Glycol (SYSTANE) 0.4-0.3 % SOLN Place 1 drop into both eyes in the  morning and at bedtime.    polyethylene glycol (MIRALAX / GLYCOLAX) 17 g packet Take 17 g by mouth every other day.   pravastatin (PRAVACHOL) 20 MG tablet Take 20 mg by mouth at bedtime.    prednisoLONE acetate (PRED FORTE) 1 % ophthalmic suspension Place 1 drop into the right eye daily.   QUEtiapine (SEROQUEL) 25 MG tablet Take 12.5 mg by mouth 2 (two) times daily.   senna (SENOKOT) 8.6 MG tablet Take 1 tablet by mouth at bedtime.   sertraline (ZOLOFT) 50 MG tablet Take 50 mg by mouth daily.    No facility-administered encounter medications on file as of 10/29/2022.    Review of Systems  Immunization History  Administered Date(s) Administered   Influenza, High Dose Seasonal PF 10/02/2013, 08/25/2015, 09/03/2016, 09/05/2017, 09/12/2018, 08/25/2019, 09/20/2022   Influenza-Unspecified 10/01/2014, 09/03/2020, 09/09/2021   Moderna SARS-COV2 Booster Vaccination 04/21/2021, 09/23/2022   Moderna Sars-Covid-2 Vaccination 11/24/2019, 12/22/2019, 09/30/2020, 04/21/2021, 04/09/2022   Pneumococcal Conjugate-13 09/30/2014, 10/21/2014   Pneumococcal Polysaccharide-23 01/23/2018   Td 04/18/2013   Unspecified SARS-COV-2 Vaccination 11/04/2021   Zoster, Live 09/04/2012   Pertinent  Health Maintenance Due  Topic Date Due   DEXA SCAN  Never done   INFLUENZA VACCINE  Completed      07/23/2020    8:00 AM 07/23/2020    7:30 PM 07/24/2020    7:25 AM 08/24/2022    2:35 PM 10/29/2022    2:43 PM  Fall Risk  Falls in the past year?    0 0  Was there an injury with Fall?    0 0  Fall Risk Category Calculator    0 0  Fall Risk Category    Low Low  Patient Fall Risk Level High fall risk High fall risk High fall risk Low fall risk Low fall risk  Patient at Risk for Falls Due to    No Fall Risks No Fall Risks  Fall risk Follow up    Falls evaluation completed Falls evaluation completed   Functional Status Survey:    Vitals:   10/29/22 1440  BP: 130/74  Pulse: 80  Resp: 16  Temp: (!) 97.4 F (36.3 C)   SpO2: 93%  Weight: 178 lb 4 oz (80.9 kg)  Height: 5\' 6"  (1.676 m)   Body mass index is 28.77 kg/m. Physical Exam  Labs reviewed: Recent Labs    02/12/22 0000 08/05/22 0700  NA 142 140  K 4.6 4.1  CL 105 106  CO2 23* 29*  BUN 17 16  CREATININE 1.1 1.1  CALCIUM 9.5 9.8   Recent Labs    02/12/22 0000 08/05/22 0700  AST 16 15  ALT 8 8  ALKPHOS 51 53  ALBUMIN 3.4* 3.5   Recent Labs    02/12/22 0000 08/05/22 0700  WBC 5.4 5.6  NEUTROABS  --  2,918.00  HGB 11.2* 11.1*  HCT 35* 33*  PLT 200 190   Lab Results  Component Value Date   TSH 4.10 08/05/2022   Lab Results  Component Value Date   HGBA1C 11.1 08/05/2022   Lab Results  Component Value Date   CHOL 136 02/12/2022   HDL 42 02/12/2022   LDLCALC 74 02/12/2022   TRIG 112 02/12/2022    Significant Diagnostic Results in last 30 days:  No results found.  Assessment/Plan There are no diagnoses linked to this encounter.   Family/ staff Communication: ***  Labs/tests ordered:  ***

## 2022-10-29 NOTE — Progress Notes (Signed)
Location:   SNF [place] Room Number: [id number] Place of Service:  SNF (31) Provider: [facility] Ashanty Coltrane NP  Virgie Dad, MD  Patient Care Team: Virgie Dad, MD as PCP - General (Internal Medicine) Pichardo-Geisinger, Mila Palmer, MD as Referring Physician Nestor Ramp, Effie Shy, MD as Referring Physician (Ophthalmology)  Extended Emergency Contact Information Primary Emergency Contact: Key, Tim Address: [address]          [place], [place] [address] [name] of [place] Phone: [phone] Relation: Nephew Secondary Emergency Contact: Owens Shark Address: [address]          [place], [place] [address] [name] of [place] Phone: [phone] Mobile Phone: [phone] Relation: Other  Code Status:  DNR Goals of care: Advanced Directive information    10/29/2022    2:51 PM  Advanced Directives  Does Patient Have a Medical Advance Directive? Yes  Type of Advance Directive Living will;Out of facility DNR (pink MOST or yellow form);Healthcare Power of Attorney  Does patient want to make changes to medical advance directive? No - Patient declined  Copy of [place] in Chart? Yes - validated most recent copy scanned in chart (See row information)  Would patient like information on creating a medical advance directive? No - Patient declined  Pre-existing out of facility DNR order (yellow form or pink MOST form) Pink MOST form placed in chart (order not valid for inpatient use);Yellow form placed in chart (order not valid for inpatient use)     Chief Complaint  Patient presents with  . Medical Management of Chronic Issues    Routine Visit with the provider on site at [facility]  . Quality Metric Gaps    Needs to Discuss Shingrix Vaccine & DEXA SCAN      HPI:  Pt is a 88 y.o. female seen today for medical management of chronic diseases.     Gait abnormality, uses w/c for mobility, risk of falling             L5  fx sustained from fall, 07/19/20, takes Tylenol, uses w/c for mobility.              Constipation, takes Senna, MiraLax               Dementia,  takes Memantine, SNF [facility] for supportive care, TSH 4.1 08/05/22             Afib, takes Eliquis             Depression/anxiety, takes Sertraline, Quetiapine,  off Buspar 09/26/20, TSH 3.70 07/10/21             RA, takes Methotrexate '15mg'$  wkly, also takes Tylenol qid.              OP, takes Prolia, Ca, Vit D             Anemia, Fe 66, Vit B12 1010 0/99/83, takes Folic acid, Hgb 38.2 03/26/38             Hyperlipidemia, takes Pravastatin, LDL 74 02/12/22    Past Medical History:  Diagnosis Date  . Acid reflux disease   . Arthritis    rheumatoid  . Atrial fibrillation ([place])   . Back pain   . Bursitis of hip, right   . Cancer ([place])    skin  . Chest pain   . Confusion   . Constipation   . Fuchs' corneal dystrophy   . GERD (gastroesophageal reflux  disease)   . Hard of hearing   . Knee pain   . Memory loss   . Paranoia ([place])   . Shoulder pain   . Venous insufficiency    Past Surgical History:  Procedure Laterality Date  . CORNEAL TRANSPLANT  2012   Dr Maudie Mercury   . RECONSTRUCTION OF EYELID     eyelid cancer 10 years ago Dr Victorino Dike   . RECONSTRUCTION OF NOSE     nose cancer/ Dr Nyoka Cowden 10 years ago     Allergies  Allergen Reactions  . Novocain [Procaine] Other (See Comments)    Unknown reaction  . Sulfamethoxazole-Trimethoprim Other (See Comments)    Unknown reaction  . Tape Other (See Comments)    Adhesive tape - unknown reaction  . Codeine Other (See Comments)    Unknown reaction  . Neosporin [Neomycin-Bacitracin Zn-Polymyx] Other (See Comments)    Unknown reaction    Allergies as of 10/29/2022       Reactions   Novocain [procaine] Other (See Comments)   Unknown reaction   Sulfamethoxazole-trimethoprim Other (See Comments)   Unknown reaction   Tape Other (See Comments)   Adhesive tape - unknown reaction   Codeine Other (See  Comments)   Unknown reaction   Neosporin [neomycin-bacitracin Zn-polymyx] Other (See Comments)   Unknown reaction        Medication List        Accurate as of October 29, 2022  3:56 PM. If you have any questions, ask your nurse or doctor.          acetaminophen 325 MG tablet Commonly known as: TYLENOL Take 650 mg by mouth every 6 (six) hours as needed.   apixaban 2.5 MG Tabs tablet Commonly known as: ELIQUIS Take 2.5 mg by mouth 2 (two) times daily.   Aquaphor Adv Protect Healing 41 % Oint Apply 1 application topically daily.   denosumab 60 MG/ML Sosy injection Commonly known as: PROLIA Inject 60 mg into the skin every 6 (six) months. On the 22nd of reach 6th month   folic acid 1 MG tablet Commonly known as: FOLVITE Take 1 mg by mouth daily.   lidocaine 4 % Apply 1 patch topically as needed. Apply to lower back Q 24hrs Once A Day   memantine 10 MG tablet Commonly known as: NAMENDA Take 10 mg by mouth 2 (two) times daily.   methotrexate 2.5 MG tablet Commonly known as: RHEUMATREX Take 15 mg by mouth every Friday. Caution:Chemotherapy. Protect from light.   Multiple Vitamin-Folic Acid Tabs Take 1 tablet by mouth daily. + B12   polyethylene glycol 17 g packet Commonly known as: MIRALAX / GLYCOLAX Take 17 g by mouth every other day.   pravastatin 20 MG tablet Commonly known as: PRAVACHOL Take 20 mg by mouth at bedtime.   prednisoLONE acetate 1 % ophthalmic suspension Commonly known as: PRED FORTE Place 1 drop into the right eye daily.   QUEtiapine 25 MG tablet Commonly known as: SEROQUEL Take 12.5 mg by mouth 2 (two) times daily.   senna 8.6 MG tablet Commonly known as: SENOKOT Take 1 tablet by mouth at bedtime.   sertraline 50 MG tablet Commonly known as: ZOLOFT Take 50 mg by mouth daily.   Systane 0.4-0.3 % Soln Generic drug: Polyethyl Glycol-Propyl Glycol Place 1 drop into both eyes in the morning and at bedtime.        Review of  Systems  Unable to perform ROS: Dementia    Immunization History  Administered  Date(s) Administered  . Influenza, High Dose Seasonal PF 10/02/2013, 08/25/2015, 09/03/2016, 09/05/2017, 09/12/2018, 08/25/2019, 09/20/2022  . Influenza-Unspecified 10/01/2014, 09/03/2020, 09/09/2021  . Moderna SARS-COV2 Booster Vaccination 04/21/2021, 09/23/2022  . Moderna Sars-Covid-2 Vaccination 11/24/2019, 12/22/2019, 09/30/2020, 04/21/2021, 04/09/2022  . Pneumococcal Conjugate-13 09/30/2014, 10/21/2014  . Pneumococcal Polysaccharide-23 01/23/2018  . Td 04/18/2013  . Unspecified SARS-COV-2 Vaccination 11/04/2021  . Zoster, Live 09/04/2012   Pertinent  Health Maintenance Due  Topic Date Due  . DEXA SCAN  Never done  . INFLUENZA VACCINE  Completed      07/23/2020    8:00 AM 07/23/2020    7:30 PM 07/24/2020    7:25 AM 08/24/2022    2:35 PM 10/29/2022    2:43 PM  Fall Risk  Falls in the past year?    0 0  Was there an injury with Fall?    0 0  Fall Risk Category Calculator    0 0  Fall Risk Category    Low Low  Patient Fall Risk Level High fall risk High fall risk High fall risk Low fall risk Low fall risk  Patient at Risk for Falls Due to    No Fall Risks No Fall Risks  Fall risk Follow up    Falls evaluation completed Falls evaluation completed   Functional Status Survey:    Vitals:   10/29/22 1440  BP: 130/74  Pulse: 80  Resp: 16  Temp: (!) 97.4 F (36.3 C)  SpO2: 93%  Weight: 178 lb 4 oz (80.9 kg)  Height: '5\' 6"'$  (1.676 m)   Body mass index is 28.77 kg/m. Physical Exam Vitals and nursing note reviewed.  Constitutional:      Appearance: Normal appearance.  HENT:     Head: Normocephalic and atraumatic.  Eyes:     Extraocular Movements: Extraocular movements intact.     Conjunctiva/sclera: Conjunctivae normal.     Pupils: Pupils are equal, round, and reactive to light.  Cardiovascular:     Rate and Rhythm: Normal rate and regular rhythm.  Pulmonary:     Effort: Pulmonary effort is  normal.     Breath sounds: No rales.  Abdominal:     General: Bowel sounds are normal.     Palpations: Abdomen is soft.     Tenderness: There is no abdominal tenderness.     Comments: Right inguinal hernia, reducible, a tennis ball sized from previous examination.   Musculoskeletal:     Cervical back: Normal range of motion and neck supple.     Right lower leg: No edema.     Left lower leg: No edema.     Comments: Lower back pain is better.   Skin:    General: Skin is warm and dry.  Neurological:     General: No focal deficit present.     Mental Status: She is alert. Mental status is at baseline.     Gait: Gait abnormal.     Comments: Oriented to person, place.   Psychiatric:        Mood and Affect: Mood normal.        Behavior: Behavior normal.     Comments: Pleasantly confused.     Labs reviewed: Recent Labs    02/12/22 0000 08/05/22 0700  NA 142 140  K 4.6 4.1  CL 105 106  CO2 23* 29*  BUN 17 16  CREATININE 1.1 1.1  CALCIUM 9.5 9.8   Recent Labs    02/12/22 0000 08/05/22 0700  AST 16 15  ALT 8 8  ALKPHOS 51 53  ALBUMIN 3.4* 3.5   Recent Labs    02/12/22 0000 08/05/22 0700  WBC 5.4 5.6  NEUTROABS  --  2,918.00  HGB 11.2* 11.1*  HCT 35* 33*  PLT 200 190   Lab Results  Component Value Date   TSH 4.10 08/05/2022   Lab Results  Component Value Date   HGBA1C 11.1 08/05/2022   Lab Results  Component Value Date   CHOL 136 02/12/2022   HDL 42 02/12/2022   LDLCALC 74 02/12/2022   TRIG 112 02/12/2022    Significant Diagnostic Results in last 30 days:  No results found.  Assessment/Plan  PAF (paroxysmal atrial fibrillation) (HCC) Hear rate is in control,  takes Eliquis   Depression with anxiety Her mood is stable, takes Sertraline, Quetiapine,  off Buspar 09/26/20, TSH 3.70 07/10/21   Rheumatoid arthritis ([place]) takes Methotrexate '15mg'$  wkly, also takes Tylenol qid.    Gait abnormality uses w/c for mobility, risk of falling  L5 vertebral  fracture (HCC) L5 fx sustained from fall, 07/19/20, takes Tylenol, uses w/c for mobility.   Slow transit constipation Stable, takes Senna, MiraLax   Vascular dementia ([place]) No behavioral issues, takes Memantine, SNF [facility] for supportive care, TSH 4.1 08/05/22   Osteoporosis  takes Prolia, Ca, Vit D  Anemia, chronic disease Fe 66, Vit B12 1010 4/81/85, takes Folic acid, Hgb 63.1 4/97/02  Hyperlipidemia takes Pravastatin, LDL 74 02/12/22    Family/ staff Communication: plan of care reviewed with the patient and charge nurse.   Labs/tests ordered:  none  Time spend 35 minutes.

## 2022-10-29 NOTE — Assessment & Plan Note (Signed)
No behavioral issues, takes Memantine, SNF [facility] for supportive care, TSH 4.1 08/05/22

## 2022-10-29 NOTE — Assessment & Plan Note (Signed)
takes Prolia, Ca, Vit D

## 2022-10-29 NOTE — Assessment & Plan Note (Signed)
takes Pravastatin, LDL 74 02/12/22                

## 2022-10-29 NOTE — Assessment & Plan Note (Signed)
takes Methotrexate 15mg wkly, also takes Tylenol qid.   

## 2022-11-26 ENCOUNTER — Encounter: Payer: Self-pay | Admitting: Nurse Practitioner

## 2022-11-26 ENCOUNTER — Non-Acute Institutional Stay (SKILLED_NURSING_FACILITY): Payer: Medicare Other | Admitting: Nurse Practitioner

## 2022-11-26 DIAGNOSIS — E785 Hyperlipidemia, unspecified: Secondary | ICD-10-CM | POA: Diagnosis not present

## 2022-11-26 DIAGNOSIS — I480 Paroxysmal atrial fibrillation: Secondary | ICD-10-CM

## 2022-11-26 DIAGNOSIS — S32050D Wedge compression fracture of fifth lumbar vertebra, subsequent encounter for fracture with routine healing: Secondary | ICD-10-CM

## 2022-11-26 DIAGNOSIS — D638 Anemia in other chronic diseases classified elsewhere: Secondary | ICD-10-CM | POA: Diagnosis not present

## 2022-11-26 DIAGNOSIS — M810 Age-related osteoporosis without current pathological fracture: Secondary | ICD-10-CM | POA: Diagnosis not present

## 2022-11-26 DIAGNOSIS — M069 Rheumatoid arthritis, unspecified: Secondary | ICD-10-CM | POA: Diagnosis not present

## 2022-11-26 DIAGNOSIS — K5901 Slow transit constipation: Secondary | ICD-10-CM

## 2022-11-26 DIAGNOSIS — F418 Other specified anxiety disorders: Secondary | ICD-10-CM

## 2022-11-26 DIAGNOSIS — F01C11 Vascular dementia, severe, with agitation: Secondary | ICD-10-CM

## 2022-11-26 NOTE — Assessment & Plan Note (Signed)
L5 fx sustained from fall, 07/19/20, takes Tylenol, uses w/c for mobility.

## 2022-11-26 NOTE — Assessment & Plan Note (Signed)
takes Memantine, SNF [facility] for supportive care, TSH 4.1 08/05/22  

## 2022-11-26 NOTE — Assessment & Plan Note (Signed)
takes Senna, MiraLax   

## 2022-11-26 NOTE — Assessment & Plan Note (Signed)
takes Sertraline, Quetiapine,  off Buspar 09/26/20, TSH 3.70 07/10/21

## 2022-11-26 NOTE — Assessment & Plan Note (Signed)
takes Pravastatin, LDL 74 02/12/22                

## 2022-11-26 NOTE — Assessment & Plan Note (Signed)
takes Methotrexate 15mg wkly, also takes Tylenol qid.   

## 2022-11-26 NOTE — Assessment & Plan Note (Signed)
Fe 66, Vit B12 1010 12/11/20, takes Folic acid, Hgb 11.1 08/05/22 

## 2022-11-26 NOTE — Assessment & Plan Note (Addendum)
Heart rate is controlled, takes Eliquis

## 2022-11-26 NOTE — Assessment & Plan Note (Signed)
takes Prolia, Ca, Vit D, update DEXA

## 2022-11-26 NOTE — Progress Notes (Unsigned)
Location:   [facility] [place] Room Number: [facility] Service:  SNF (31) Provider:  Marlana Latus, NP  Virgie Dad, MD  Patient Care Team: Virgie Dad, MD as PCP - General (Internal Medicine) Pichardo-Geisinger, Mila Palmer, MD as Referring Physician Nestor Ramp, Effie Shy, MD as Referring Physician (Ophthalmology)  Extended Emergency Contact Information Primary Emergency Contact: Key, Tim Address: [address]          [place], [place] [address] [name] of [place] Phone: [phone] Relation: [name] Secondary Emergency Contact: Owens Shark Address: [address]          [place],  [address] [name] of [place] Phone: [phone] Mobile Phone: [phone] Relation: Other  Code Status:  FULL CODE Goals of care: Advanced Directive information    11/26/2022   10:18 AM  Advanced Directives  Does Patient Have a Medical Advance Directive? Yes  Type of Advance Directive Living will;Out of facility DNR (pink MOST or yellow form)  Pre-existing out of facility DNR order (yellow form or pink MOST form) Pink MOST form placed in chart (order not valid for inpatient use)     Chief Complaint  Patient presents with   Medical Management of Chronic Issues    Routine follow up visit.   Immunizations    Shingrix vaccine and 7th COVID booster due   Quality Metric Gaps    Dexa scan    HPI:  Pt is a 89 y.o. female seen today for medical management of chronic diseases.    Gait abnormality, uses w/c for mobility, risk of falling             L5 fx sustained from fall, 07/19/20, takes Tylenol, uses w/c for mobility.              Constipation, takes Senna, MiraLax               Dementia, takes Memantine, SNF [facility] for supportive care, TSH 4.1 08/05/22             Afib, takes Eliquis             Depression/anxiety, takes Sertraline, Quetiapine,  off Buspar 09/26/20, TSH 3.70 07/10/21             RA, takes Methotrexate '15mg'$  wkly, also takes  Tylenol qid.              OP, takes Prolia, Ca, Vit D             Anemia, Fe 66, Vit B12 1010 9/38/10, takes Folic acid, Hgb 17.5 11/23/56             Hyperlipidemia, takes Pravastatin, LDL 74 02/12/22                   Gait abnormality, uses w/c for mobility, risk of falling             L5 fx sustained from fall, 07/19/20, takes Tylenol, uses w/c for mobility.              Constipation, takes Senna, MiraLax               Dementia,  takes Memantine, SNF [facility] for supportive care, TSH 4.1 08/05/22             Afib, takes Eliquis             Depression/anxiety, takes Sertraline, Quetiapine,  off Buspar 09/26/20, TSH 3.70 07/10/21  RA, takes Methotrexate '15mg'$  wkly, also takes Tylenol qid.              OP, takes Prolia, Ca, Vit D             Anemia, Fe 66, Vit B12 1010 02/29/80, takes Folic acid, Hgb 19.1 4/78/29             Hyperlipidemia, takes Pravastatin, LDL 74 02/12/22                 Past Medical History:  Diagnosis Date   Acid reflux disease    Arthritis    rheumatoid   Atrial fibrillation (HCC)    Back pain    Bursitis of hip, right    Cancer (HCC)    skin   Chest pain    Confusion    Constipation    Fuchs' corneal dystrophy    GERD (gastroesophageal reflux disease)    Hard of hearing    Knee pain    Memory loss    Paranoia (HCC)    Shoulder pain    Venous insufficiency    Past Surgical History:  Procedure Laterality Date   CORNEAL TRANSPLANT  2012   Dr Maudie Mercury    RECONSTRUCTION OF EYELID     eyelid cancer 10 years ago Dr Victorino Dike    RECONSTRUCTION OF NOSE     nose cancer/ Dr Nyoka Cowden 10 years ago     Allergies  Allergen Reactions   Novocain [Procaine] Other (See Comments)    Unknown reaction   Sulfamethoxazole-Trimethoprim Other (See Comments)    Unknown reaction   Tape Other (See Comments)    Adhesive tape - unknown reaction   Codeine Other (See Comments)    Unknown reaction   Neosporin [Neomycin-Bacitracin Zn-Polymyx] Other (See Comments)    Unknown  reaction    Allergies as of 11/26/2022       Reactions   Novocain [procaine] Other (See Comments)   Unknown reaction   Sulfamethoxazole-trimethoprim Other (See Comments)   Unknown reaction   Tape Other (See Comments)   Adhesive tape - unknown reaction   Codeine Other (See Comments)   Unknown reaction   Neosporin [neomycin-bacitracin Zn-polymyx] Other (See Comments)   Unknown reaction        Medication List        Accurate as of November 26, 2022 11:59 PM. If you have any questions, ask your nurse or doctor.          STOP taking these medications    lidocaine 4 % Stopped by: Alfie Alderfer X Margert Edsall, NP       TAKE these medications    acetaminophen 325 MG tablet Commonly known as: TYLENOL Take 650 mg by mouth every 6 (six) hours as needed.   apixaban 2.5 MG Tabs tablet Commonly known as: ELIQUIS Take 2.5 mg by mouth 2 (two) times daily.   Aquaphor Adv Protect Healing 41 % Oint Apply 1 application topically daily.   denosumab 60 MG/ML Sosy injection Commonly known as: PROLIA Inject 60 mg into the skin every 6 (six) months. On the 22nd of reach 6th month   folic acid 1 MG tablet Commonly known as: FOLVITE Take 1 mg by mouth daily.   memantine 10 MG tablet Commonly known as: NAMENDA Take 10 mg by mouth 2 (two) times daily.   methotrexate 2.5 MG tablet Commonly known as: RHEUMATREX Take 15 mg by mouth every Friday. Caution:Chemotherapy. Protect from light.   Multiple Vitamin-Folic Acid Tabs Take 1 tablet  by mouth daily. + B12   polyethylene glycol 17 g packet Commonly known as: MIRALAX / GLYCOLAX Take 17 g by mouth every other day.   pravastatin 20 MG tablet Commonly known as: PRAVACHOL Take 20 mg by mouth at bedtime.   prednisoLONE acetate 1 % ophthalmic suspension Commonly known as: PRED FORTE Place 1 drop into the right eye daily.   QUEtiapine 25 MG tablet Commonly known as: SEROQUEL Take 12.5 mg by mouth 2 (two) times daily.   senna 8.6 MG  tablet Commonly known as: SENOKOT Take 2 tablets by mouth at bedtime.   sertraline 50 MG tablet Commonly known as: ZOLOFT Take 50 mg by mouth daily.   Systane 0.4-0.3 % Soln Generic drug: Polyethyl Glycol-Propyl Glycol Place 1 drop into both eyes in the morning and at bedtime.   white petrolatum ointment Apply topically daily. Apply to nose and bilateral temples        Review of Systems  Unable to perform ROS: Dementia  Constitutional:  Negative for unexpected weight change.    Immunization History  Administered Date(s) Administered   Influenza, High Dose Seasonal PF 10/02/2013, 08/25/2015, 09/03/2016, 09/05/2017, 09/12/2018, 08/25/2019, 09/20/2022   Influenza-Unspecified 10/01/2014, 09/03/2020, 09/09/2021   Moderna SARS-COV2 Booster Vaccination 04/21/2021, 09/23/2022   Moderna Sars-Covid-2 Vaccination 11/24/2019, 12/22/2019, 09/30/2020, 04/21/2021, 04/09/2022   Pneumococcal Conjugate-13 09/30/2014, 10/21/2014   Pneumococcal Polysaccharide-23 01/23/2018   Td 04/18/2013   Unspecified SARS-COV-2 Vaccination 11/04/2021   Zoster, Live 09/04/2012   Pertinent  Health Maintenance Due  Topic Date Due   DEXA SCAN  Never done   INFLUENZA VACCINE  Completed      07/23/2020    8:00 AM 07/23/2020    7:30 PM 07/24/2020    7:25 AM 08/24/2022    2:35 PM 10/29/2022    2:43 PM  [place] in the past year?    0 0  Was there an injury with Fall?    0 0  Fall Risk Category Calculator    0 0  Fall Risk Category    Low Low  Patient Fall Risk Level High fall risk High fall risk High fall risk Low fall risk Low fall risk  Patient at Risk for Falls Due to    No Fall Risks No Fall Risks  Fall risk Follow up    Falls evaluation completed Falls evaluation completed   Functional Status Survey:    Vitals:   11/26/22 1009  BP: 122/70  Pulse: 80  Resp: 17  Temp: 97.6 F (36.4 C)  SpO2: 95%  Weight: 178 lb 11.2 oz (81.1 kg)  Height: '5\' 6"'$  (9.476 m)   Body mass index is 28.84  kg/m. Physical Exam Vitals and nursing note reviewed.  Constitutional:      Appearance: Normal appearance.  HENT:     Head: Normocephalic and atraumatic.  Eyes:     Extraocular Movements: Extraocular movements intact.     Conjunctiva/sclera: Conjunctivae normal.     Pupils: Pupils are equal, round, and reactive to light.  Cardiovascular:     Rate and Rhythm: Normal rate and regular rhythm.  Pulmonary:     Effort: Pulmonary effort is normal.     Breath sounds: No rales.  Abdominal:     General: Bowel sounds are normal.     Palpations: Abdomen is soft.     Tenderness: There is no abdominal tenderness.     Comments: Right inguinal hernia, reducible, a tennis ball sized from previous examination.   Musculoskeletal:  Cervical back: Normal range of motion and neck supple.     Right lower leg: Edema present.     Left lower leg: Edema present.     Comments: Lower back pain is better. Trace edema BLE  Skin:    General: Skin is warm and dry.  Neurological:     General: No focal deficit present.     Mental Status: She is alert. Mental status is at baseline.     Gait: Gait abnormal.     Comments: Oriented to person, place.   Psychiatric:        Mood and Affect: Mood normal.        Behavior: Behavior normal.     Comments: Pleasantly confused.      Labs reviewed: Recent Labs    02/12/22 0000 08/05/22 0700  NA 142 140  K 4.6 4.1  CL 105 106  CO2 23* 29*  BUN 17 16  CREATININE 1.1 1.1  CALCIUM 9.5 9.8   Recent Labs    02/12/22 0000 08/05/22 0700  AST 16 15  ALT 8 8  ALKPHOS 51 53  ALBUMIN 3.4* 3.5   Recent Labs    02/12/22 0000 08/05/22 0700  WBC 5.4 5.6  NEUTROABS  --  2,918.00  HGB 11.2* 11.1*  HCT 35* 33*  PLT 200 190   Lab Results  Component Value Date   TSH 4.10 08/05/2022   Lab Results  Component Value Date   HGBA1C 11.1 08/05/2022   Lab Results  Component Value Date   CHOL 136 02/12/2022   HDL 42 02/12/2022   LDLCALC 74 02/12/2022   TRIG  112 02/12/2022    Significant Diagnostic Results in last 30 days:  No results found.  Assessment/Plan Osteoporosis takes Prolia, Ca, Vit D, update DEXA  Anemia, chronic disease Fe 66, Vit B12 1010 04/27/29, takes Folic acid, Hgb 16.0 11/30/30  Hyperlipidemia takes Pravastatin, LDL 74 02/12/22                  Rheumatoid arthritis ([place])  takes Methotrexate '15mg'$  wkly, also takes Tylenol qid.    Depression with anxiety takes Sertraline, Quetiapine,  off Buspar 09/26/20, TSH 3.70 07/10/21   PAF (paroxysmal atrial fibrillation) (HCC) Heart rate is controlled, takes Eliquis   Vascular dementia ([place]) takes Memantine, SNF [facility] for supportive care, TSH 4.1 08/05/22   Slow transit constipation  takes Senna, MiraLax    L5 vertebral fracture ([place]) L5 fx sustained from fall, 07/19/20, takes Tylenol, uses w/c for mobility.       Family/ staff Communication: plan of care reviewed with the patient and charge nurse   Labs/tests ordered:  none  Time spend 25 minutes.

## 2022-11-29 ENCOUNTER — Encounter: Payer: Self-pay | Admitting: Nurse Practitioner

## 2023-01-04 ENCOUNTER — Non-Acute Institutional Stay (SKILLED_NURSING_FACILITY): Payer: Medicare Other | Admitting: Family Medicine

## 2023-01-04 DIAGNOSIS — D638 Anemia in other chronic diseases classified elsewhere: Secondary | ICD-10-CM | POA: Diagnosis not present

## 2023-01-04 DIAGNOSIS — E785 Hyperlipidemia, unspecified: Secondary | ICD-10-CM | POA: Diagnosis not present

## 2023-01-04 DIAGNOSIS — R269 Unspecified abnormalities of gait and mobility: Secondary | ICD-10-CM | POA: Diagnosis not present

## 2023-01-04 DIAGNOSIS — F418 Other specified anxiety disorders: Secondary | ICD-10-CM

## 2023-01-04 DIAGNOSIS — I480 Paroxysmal atrial fibrillation: Secondary | ICD-10-CM

## 2023-01-04 NOTE — Progress Notes (Signed)
Provider:  Alain Honey, MD Location:      Place of Service:     PCP: Virgie Dad, MD Patient Care Team: Virgie Dad, MD as PCP - General (Internal Medicine) Pichardo-Geisinger, Mila Palmer, MD as Referring Physician Nestor Ramp, Effie Shy, MD as Referring Physician (Ophthalmology)  Extended Emergency Contact Information Primary Emergency Contact: Key, Tim Address: [address]          [place], [place] [address] [name] of [employer] Phone: [phone] Relation: Nephew Secondary Emergency Contact: Owens Shark Address: [address]          [place], [place] [address] [name] of [place] Phone: [phone] Mobile Phone: [phone] Relation: Other  Code Status:  Goals of Care: Advanced Directive information    11/26/2022   10:18 AM  Advanced Directives  Does Patient Have a Medical Advance Directive? Yes  Type of Advance Directive Living will;Out of facility DNR (pink MOST or yellow form)  Pre-existing out of facility DNR order (yellow form or pink MOST form) Pink MOST form placed in chart (order not valid for inpatient use)      No chief complaint on file.   HPI: Patient is a 89 y.o. female seen today for medical management of chronic problems including atrial fibrillation, rheumatoid arthritis,  with risk of falls, dementia, and depression and anxiety  hyperlipidemia, I found her sitting in the day room today.  It was difficult to obtain appropriate answers to my questions.  Basically she had no complaints.  She seemed confused.  She is on memantine for dementia and also takes sertraline Seroquel for anxiety and depression. For rheumatoid arthritis she takes methotrexate 15 mg weekly  Past Medical History:  Diagnosis Date   Acid reflux disease    Arthritis    rheumatoid   Atrial fibrillation (HCC)    Back pain    Bursitis of hip, right    Cancer (HCC)    skin   Chest pain    Confusion    Constipation    Fuchs' corneal dystrophy     GERD (gastroesophageal reflux disease)    Hard of hearing    Knee pain    Memory loss    Paranoia (HCC)    Shoulder pain    Venous insufficiency    Past Surgical History:  Procedure Laterality Date   CORNEAL TRANSPLANT  2012   Dr Maudie Mercury    RECONSTRUCTION OF EYELID     eyelid cancer 10 years ago Dr Victorino Dike    RECONSTRUCTION OF NOSE     nose cancer/ Dr Nyoka Cowden 10 years ago     reports that she has never smoked. She has never used smokeless tobacco. She reports that she does not currently use alcohol. She reports that she does not currently use drugs. Social History   Socioeconomic History   Marital status: Widowed    Spouse name: Not on file   Number of children: Not on file   Years of education: Not on file   Highest education level: Not on file  Occupational History   Not on file  Tobacco Use   Smoking status: Never   Smokeless tobacco: Never  Vaping Use   Vaping Use: Never used  Substance and Sexual Activity   Alcohol use: Not Currently   Drug use: Not Currently   Sexual activity: Not on file  Other Topics Concern   Not on file  Social History Narrative   Social History      Diet? No  restrictions       Do you drink/eat things with caffeine? Sometimes       Marital status?  Widow                                  What year were you married? 1943      Do you live in a house, apartment, assisted living, condo, trailer, etc.? Assisted Living      Is it one or more stories? YES       How many persons live in your home? 1      Do you have any pets in your home? (please list) NO       Highest level of education completed?high school       Current or past profession: Clerical       Do you exercise? Little                                      Type & how often? Walking        Advanced Directive       Do you have a living will?YES      Do you have a DNR form? NO                                 If not, do you want to discuss one?      Do you have signed  POA/HPOA for forms? YES      Functional Status      Do you have difficulty bathing or dressing yourself?YES      Do you have difficulty preparing food or eating? NO      Do you have difficulty managing your medications?YES      Do you have difficulty managing your finances?YES      Do you have difficulty affording your medications?NO      Social Determinants of Health   Financial Resource Strain: Not on file  Food Insecurity: Not on file  Transportation Needs: Not on file  Physical Activity: Not on file  Stress: Not on file  Social Connections: Not on file  Intimate Partner Violence: Not on file    Functional Status Survey:    Family History  Problem Relation Age of Onset   Heart disease Father    Alzheimer's disease Sister    Parkinson's disease Sister     Health Maintenance  Topic Date Due   COVID-19 Vaccine (9 - 2023-24 season) 11/18/2022   Medicare Annual Wellness (AWV)  01/27/2023   Zoster Vaccines- Shingrix (1 of 2) 03/03/2023 (Originally 07/26/1972)   DEXA SCAN  12/03/2023 (Originally 07/26/1989)   DTaP/Tdap/Td (2 - Tdap) 04/19/2023   Pneumonia Vaccine 55+ Years old  Completed   INFLUENZA VACCINE  Completed   HPV VACCINES  Aged Out    Allergies  Allergen Reactions   Novocain [Procaine] Other (See Comments)    Unknown reaction   Sulfamethoxazole-Trimethoprim Other (See Comments)    Unknown reaction   Tape Other (See Comments)    Adhesive tape - unknown reaction   Codeine Other (See Comments)    Unknown reaction   Neosporin [Neomycin-Bacitracin Zn-Polymyx] Other (See Comments)    Unknown reaction    Outpatient Encounter Medications as of 01/04/2023  Medication Sig  acetaminophen (TYLENOL) 325 MG tablet Take 650 mg by mouth every 6 (six) hours as needed.   apixaban (ELIQUIS) 2.5 MG TABS tablet Take 2.5 mg by mouth 2 (two) times daily.   denosumab (PROLIA) 60 MG/ML SOSY injection Inject 60 mg into the skin every 6 (six) months. On the 22nd of reach 6th  month   Emollient (AQUAPHOR ADV PROTECT HEALING) 41 % OINT Apply 1 application topically daily.   folic acid (FOLVITE) 1 MG tablet Take 1 mg by mouth daily.   memantine (NAMENDA) 10 MG tablet Take 10 mg by mouth 2 (two) times daily.    methotrexate (RHEUMATREX) 2.5 MG tablet Take 15 mg by mouth every Friday. Caution:Chemotherapy. Protect from light.   Multiple Vitamin-Folic Acid TABS Take 1 tablet by mouth daily. + B12   Polyethyl Glycol-Propyl Glycol (SYSTANE) 0.4-0.3 % SOLN Place 1 drop into both eyes in the morning and at bedtime.    polyethylene glycol (MIRALAX / GLYCOLAX) 17 g packet Take 17 g by mouth every other day.   pravastatin (PRAVACHOL) 20 MG tablet Take 20 mg by mouth at bedtime.    prednisoLONE acetate (PRED FORTE) 1 % ophthalmic suspension Place 1 drop into the right eye daily.   QUEtiapine (SEROQUEL) 25 MG tablet Take 12.5 mg by mouth 2 (two) times daily.   senna (SENOKOT) 8.6 MG tablet Take 2 tablets by mouth at bedtime.   sertraline (ZOLOFT) 50 MG tablet Take 50 mg by mouth daily.    white petrolatum ointment Apply topically daily. Apply to nose and bilateral temples   No facility-administered encounter medications on file as of 01/04/2023.    Review of Systems  Unable to perform ROS: Dementia    There were no vitals filed for this visit. There is no height or weight on file to calculate BMI. Physical Exam Vitals and nursing note reviewed.  Constitutional:      Appearance: Normal appearance.  HENT:     Head: Normocephalic.  Eyes:     Extraocular Movements: Extraocular movements intact.  Cardiovascular:     Rate and Rhythm: Normal rate. Rhythm irregular.     Heart sounds:     No gallop.  Pulmonary:     Effort: Pulmonary effort is normal.  Abdominal:     General: Bowel sounds are normal.     Palpations: Abdomen is soft.  Musculoskeletal:     Comments: Seated in wheelchair unable to ambulate without assistive device  Neurological:     General: No focal  deficit present.     Mental Status: She is alert.     Comments: Oriented only to self  Psychiatric:        Mood and Affect: Mood normal.        Behavior: Behavior normal.     Labs reviewed: Basic Metabolic Panel: Recent Labs    02/12/22 0000 08/05/22 0700  NA 142 140  K 4.6 4.1  CL 105 106  CO2 23* 29*  BUN 17 16  CREATININE 1.1 1.1  CALCIUM 9.5 9.8   Liver Function Tests: Recent Labs    02/12/22 0000 08/05/22 0700  AST 16 15  ALT 8 8  ALKPHOS 51 53  ALBUMIN 3.4* 3.5   No results for input(s): "LIPASE", "AMYLASE" in the last 8760 hours. No results for input(s): "AMMONIA" in the last 8760 hours. CBC: Recent Labs    02/12/22 0000 08/05/22 0700  WBC 5.4 5.6  NEUTROABS  --  2,918.00  HGB 11.2* 11.1*  HCT 35*  33*  PLT 200 190   Cardiac Enzymes: No results for input(s): "CKTOTAL", "CKMB", "CKMBINDEX", "TROPONINI" in the last 8760 hours. BNP: Invalid input(s): "POCBNP" Lab Results  Component Value Date   HGBA1C 11.1 08/05/2022   Lab Results  Component Value Date   TSH 4.10 08/05/2022   No results found for: "VITAMINB12" No results found for: "FOLATE" Lab Results  Component Value Date   IRON 66 12/11/2020   TIBC 196 12/11/2020   FERRITIN 165 12/11/2020    Imaging and Procedures obtained prior to SNF admission: CT Lumbar Spine Wo Contrast  Result Date: 07/19/2020 CLINICAL DATA:  Fall.  Back pain. EXAM: CT LUMBAR SPINE WITHOUT CONTRAST TECHNIQUE: Multidetector CT imaging of the lumbar spine was performed without intravenous contrast administration. Multiplanar CT image reconstructions were also generated. COMPARISON:  Lumbar radiographs 07/19/2020 FINDINGS: Segmentation: Normal Alignment: Mild anterolisthesis L2-3, L3-4, L4-5 Vertebrae: Nondisplaced acute fractures of the L5 transverse process bilaterally. No vertebral body fracture. Paraspinal and other soft tissues: Atherosclerotic calcification aorta and iliac arteries without aneurysm. No paraspinous  mass or adenopathy. Right renal cyst. Disc levels: T11-12: Moderate disc degeneration and spurring. Moderate foraminal narrowing bilaterally due to spurring T12-L1: Mild disc degeneration.  Negative for stenosis. L1-2: Disc degeneration and mild spurring. No significant stenosis. Mild facet degeneration. L2-3: Disc degeneration with diffuse endplate spurring. Bilateral facet degeneration. Mild spinal stenosis. Moderate subarticular stenosis on the left and mild subarticular stenosis on the right. L3-4: Disc degeneration with disc bulging. Bilateral facet hypertrophy. Moderate subarticular stenosis on the right. Spinal canal adequate in size L4-5: Broad-based central disc protrusion with diffuse endplate spurring and bilateral facet degeneration. Moderate subarticular stenosis bilaterally L5-S1: Mild disc and mild facet degeneration. No significant stenosis. IMPRESSION: Nondisplaced fractures of the L5 transverse process bilaterally. These appear acute. No fracture of the vertebral body. Multilevel degenerative change throughout the lumbar spine as above. Electronically Signed   By: Franchot Gallo M.D.   On: 07/19/2020 20:12   DG Lumbar Spine 2-3 Views  Result Date: 07/19/2020 CLINICAL DATA:  Unwitnessed fall.  Low back pain. Unable to stand. EXAM: LUMBAR SPINE - 2-3 VIEW COMPARISON:  None. FINDINGS: No evidence of acute fracture. Vertebral body heights are preserved. There is 5 mm anterolisthesis of L4 on L5 that is likely degenerative. Disc space narrowing and endplate spurring most prominent at L4-L5 and L5-S1. bones are diffusely under mineralized. Multilevel facet hypertrophy. The sacroiliac joints are congruent with mild degenerative change. Aortic atherosclerosis. IMPRESSION: 1. Multilevel degenerative disc disease and facet hypertrophy in the lumbar spine. No evidence of acute fracture. 2. Grade 1 anterolisthesis of L4 on L5, likely degenerative. Electronically Signed   By: Keith Rake M.D.   On:  07/19/2020 16:31   DG Pelvis 1-2 Views  Result Date: 07/19/2020 CLINICAL DATA:  Unwitnessed fall.  Low back pain.  Unable to stand. EXAM: PELVIS - 1-2 VIEW COMPARISON:  None. FINDINGS: The cortical margins of the bony pelvis are intact. No fracture. Pubic symphysis and sacroiliac joints are congruent. Portions of the pubic rami are partially obscured by overlying stool/bowel gas. Both femoral heads are well-seated in the respective acetabula. Moderate bilateral hip osteoarthritis with joint space narrowing. IMPRESSION: 1. No pelvic fracture. 2. Moderate bilateral hip osteoarthritis. Electronically Signed   By: Keith Rake M.D.   On: 07/19/2020 16:29   DG Chest 1 View  Result Date: 07/19/2020 CLINICAL DATA:  Status post fall. EXAM: CHEST  1 VIEW COMPARISON:  November 26, 2018 FINDINGS: Mild, chronic appearing diffusely  increased lung markings are seen. There is no evidence of acute infiltrate, pleural effusion or pneumothorax. The heart size and mediastinal contours are within normal limits. There is marked severity calcification of the thoracic aorta. There is a chronic fracture deformity of the sixth right rib. An additional chronic deformity of the mid left clavicle is seen. Degenerative changes are noted throughout the thoracic spine. IMPRESSION: Chronic appearing interstitial lung disease without evidence of acute or active cardiopulmonary disease. Electronically Signed   By: Virgina Norfolk M.D.   On: 07/19/2020 16:28   CT Head Wo Contrast  Result Date: 07/19/2020 CLINICAL DATA:  Unwitnessed fall.  On Eliquis. EXAM: CT HEAD WITHOUT CONTRAST CT CERVICAL SPINE WITHOUT CONTRAST TECHNIQUE: Multidetector CT imaging of the head and cervical spine was performed following the standard protocol without intravenous contrast. Multiplanar CT image reconstructions of the cervical spine were also generated. COMPARISON:  MRI brain dated May 28, 2017. CT head and cervical spine dated March 11, 2016. FINDINGS:  CT HEAD FINDINGS Brain: No evidence of acute infarction, hemorrhage, hydrocephalus, extra-axial collection or mass lesion/mass effect. Stable atrophy and chronic microvascular ischemic changes. Slightly asymmetric hyperdensity near the left hippocampus and temporal horn is favored to represent choroid plexus. Vascular: Atherosclerotic vascular calcification of the carotid siphons. No hyperdense vessel. Skull: Normal. Negative for fracture or focal lesion. Sinuses/Orbits: Small air-fluid level in the right sphenoid sinus. Frothy secretions a left posterior ethmoid air cell. The orbits are unremarkable. Other: None. CT CERVICAL SPINE FINDINGS Alignment: No traumatic malalignment. Unchanged trace anterolisthesis at C4-C5 and C7-T1. Skull base and vertebrae: No acute fracture. No primary bone lesion or focal pathologic process. Soft tissues and spinal canal: No prevertebral fluid or swelling. No visible canal hematoma. Disc levels: Multilevel disc height loss, severe at C4-C5 and C5-C6. Moderate to severe facet uncovertebral hypertrophy throughout the cervical spine. Findings are similar to prior study Upper chest: Biapical pleuroparenchymal scarring. Other: Secretions in the trachea. IMPRESSION: 1. No acute intracranial abnormality. Stable atrophy and chronic microvascular ischemic changes. 2. No acute cervical spine fracture or traumatic malalignment. 3. Secretions in the trachea. Correlate for aspiration. Electronically Signed   By: Titus Dubin M.D.   On: 07/19/2020 16:17   CT Cervical Spine Wo Contrast  Result Date: 07/19/2020 CLINICAL DATA:  Unwitnessed fall.  On Eliquis. EXAM: CT HEAD WITHOUT CONTRAST CT CERVICAL SPINE WITHOUT CONTRAST TECHNIQUE: Multidetector CT imaging of the head and cervical spine was performed following the standard protocol without intravenous contrast. Multiplanar CT image reconstructions of the cervical spine were also generated. COMPARISON:  MRI brain dated May 28, 2017. CT head  and cervical spine dated March 11, 2016. FINDINGS: CT HEAD FINDINGS Brain: No evidence of acute infarction, hemorrhage, hydrocephalus, extra-axial collection or mass lesion/mass effect. Stable atrophy and chronic microvascular ischemic changes. Slightly asymmetric hyperdensity near the left hippocampus and temporal horn is favored to represent choroid plexus. Vascular: Atherosclerotic vascular calcification of the carotid siphons. No hyperdense vessel. Skull: Normal. Negative for fracture or focal lesion. Sinuses/Orbits: Small air-fluid level in the right sphenoid sinus. Frothy secretions a left posterior ethmoid air cell. The orbits are unremarkable. Other: None. CT CERVICAL SPINE FINDINGS Alignment: No traumatic malalignment. Unchanged trace anterolisthesis at C4-C5 and C7-T1. Skull base and vertebrae: No acute fracture. No primary bone lesion or focal pathologic process. Soft tissues and spinal canal: No prevertebral fluid or swelling. No visible canal hematoma. Disc levels: Multilevel disc height loss, severe at C4-C5 and C5-C6. Moderate to severe facet uncovertebral hypertrophy throughout the  cervical spine. Findings are similar to prior study Upper chest: Biapical pleuroparenchymal scarring. Other: Secretions in the trachea. IMPRESSION: 1. No acute intracranial abnormality. Stable atrophy and chronic microvascular ischemic changes. 2. No acute cervical spine fracture or traumatic malalignment. 3. Secretions in the trachea. Correlate for aspiration. Electronically Signed   By: Titus Dubin M.D.   On: 07/19/2020 16:17    Assessment/Plan 1. Anemia, chronic disease Recent hemoglobin 11.1.  Takes folic acid  2. Depression with anxiety Symptoms managed with combination of sertraline and Seroquel  3. Gait abnormality No recent falls but requires assistive devices to ambulate  4. Hyperlipidemia, unspecified hyperlipidemia type Most recent LDL was 7410 months ago.  She does take pravastatin  5. PAF  (paroxysmal atrial fibrillation) (HCC) Not on rate controlling medicine but does take Eliquis for stroke prevention    Family/ staff Communication:   Labs/tests ordered:  .smmsig

## 2023-01-06 ENCOUNTER — Non-Acute Institutional Stay (SKILLED_NURSING_FACILITY): Payer: Medicare Other | Admitting: Nurse Practitioner

## 2023-01-06 ENCOUNTER — Encounter: Payer: Self-pay | Admitting: Nurse Practitioner

## 2023-01-06 DIAGNOSIS — F418 Other specified anxiety disorders: Secondary | ICD-10-CM

## 2023-01-06 DIAGNOSIS — R21 Rash and other nonspecific skin eruption: Secondary | ICD-10-CM

## 2023-01-06 DIAGNOSIS — I480 Paroxysmal atrial fibrillation: Secondary | ICD-10-CM

## 2023-01-06 DIAGNOSIS — L0390 Cellulitis, unspecified: Secondary | ICD-10-CM | POA: Diagnosis not present

## 2023-01-06 DIAGNOSIS — F0390 Unspecified dementia without behavioral disturbance: Secondary | ICD-10-CM | POA: Diagnosis not present

## 2023-01-06 DIAGNOSIS — M069 Rheumatoid arthritis, unspecified: Secondary | ICD-10-CM

## 2023-01-06 NOTE — Assessment & Plan Note (Signed)
takes Sertraline, Quetiapine-GDR 01/04/23 to daily/bid,  off Buspar 09/26/20

## 2023-01-06 NOTE — Progress Notes (Signed)
Location:   SNF [place] Room Number: 67 Place of Service:  SNF (31) Provider: [facility] Maquita Sandoval NP  Virgie Dad, MD  Patient Care Team: Virgie Dad, MD as PCP - General (Internal Medicine) Pichardo-Geisinger, Mila Palmer, MD as Referring Physician Nestor Ramp, Effie Shy, MD as Referring Physician (Ophthalmology)  Extended Emergency Contact Information Primary Emergency Contact: Key, Tim Address: [address]          [place], [place] [address] [name] of [place] Phone: [phone] Relation: Nephew Secondary Emergency Contact: Owens Shark Address: [address]          [place],  [address] [name] of [place] Phone: [phone] Mobile Phone: [phone] Relation: Other  Code Status: DNR Goals of care: Advanced Directive information    11/26/2022   10:18 AM  Advanced Directives  Does Patient Have a Medical Advance Directive? Yes  Type of Advance Directive Living will;Out of facility DNR (pink MOST or yellow form)  Pre-existing out of facility DNR order (yellow form or pink MOST form) Pink MOST form placed in chart (order not valid for inpatient use)     Chief Complaint  Patient presents with   Acute Visit    Redness, warmth, swelling, scaly, sensitive to touch upper chest/lower neck from the right clavicle to the left.     HPI:  Pt is a 89 y.o. female seen today for an acute visit for redness, warmth, swelling, scaly, sensitive to touch from the right clavicle to the left upper chest/lower neck, onset and duration are uncertain.   Gait abnormality, uses w/c for mobility, risk of falling             L5 fx sustained from fall, 07/19/20, takes Tylenol, uses w/c for mobility.              Constipation, takes Senna, MiraLax               Dementia, takes Memantine, SNF [facility] for supportive care, TSH 4.1 08/05/22             Afib, takes Eliquis             Depression/anxiety, takes Sertraline, Quetiapine,  off Buspar 09/26/20              RA, takes Methotrexate 38m wkly, also takes Tylenol qid.              OP, takes Prolia, Ca, Vit D             Anemia, Fe 66, Vit B12 1010 [id number] takes Folic acid, Hgb [id number]            Hyperlipidemia, takes Pravastatin, LDL 74 02/12/22                 Past Medical History:  Diagnosis Date   Acid reflux disease    Arthritis    rheumatoid   Atrial fibrillation (HCC)    Back pain    Bursitis of hip, right    Cancer (HCC)    skin   Chest pain    Confusion    Constipation    Fuchs' corneal dystrophy    GERD (gastroesophageal reflux disease)    Hard of hearing    Knee pain    Memory loss    Paranoia (HCC)    Shoulder pain    Venous insufficiency    Past Surgical History:  Procedure Laterality Date   CORNEAL TRANSPLANT  2012   Dr KMaudie Mercury  RECONSTRUCTION OF EYELID     eyelid cancer 10 years ago Dr Victorino Dike    RECONSTRUCTION OF NOSE     nose cancer/ Dr Nyoka Cowden 10 years ago     Allergies  Allergen Reactions   Novocain [Procaine] Other (See Comments)    Unknown reaction   Sulfamethoxazole-Trimethoprim Other (See Comments)    Unknown reaction   Tape Other (See Comments)    Adhesive tape - unknown reaction   Codeine Other (See Comments)    Unknown reaction   Neosporin [Neomycin-Bacitracin Zn-Polymyx] Other (See Comments)    Unknown reaction    Allergies as of 01/06/2023       Reactions   Novocain [procaine] Other (See Comments)   Unknown reaction   Sulfamethoxazole-trimethoprim Other (See Comments)   Unknown reaction   Tape Other (See Comments)   Adhesive tape - unknown reaction   Codeine Other (See Comments)   Unknown reaction   Neosporin [neomycin-bacitracin Zn-polymyx] Other (See Comments)   Unknown reaction        Medication List        Accurate as of January 06, 2023 12:10 PM. If you have any questions, ask your nurse or doctor.          acetaminophen 325 MG tablet Commonly known as: TYLENOL Take 650 mg by mouth every 6 (six) hours as  needed.   apixaban 2.5 MG Tabs tablet Commonly known as: ELIQUIS Take 2.5 mg by mouth 2 (two) times daily.   Aquaphor Adv Protect Healing 41 % Oint Apply 1 application topically daily.   denosumab 60 MG/ML Sosy injection Commonly known as: PROLIA Inject 60 mg into the skin every 6 (six) months. On the 22nd of reach 6th month   folic acid 1 MG tablet Commonly known as: FOLVITE Take 1 mg by mouth daily.   memantine 10 MG tablet Commonly known as: NAMENDA Take 10 mg by mouth 2 (two) times daily.   methotrexate 2.5 MG tablet Commonly known as: RHEUMATREX Take 15 mg by mouth every Friday. Caution:Chemotherapy. Protect from light.   Multiple Vitamin-Folic Acid Tabs Take 1 tablet by mouth daily. + B12   polyethylene glycol 17 g packet Commonly known as: MIRALAX / GLYCOLAX Take 17 g by mouth every other day.   pravastatin 20 MG tablet Commonly known as: PRAVACHOL Take 20 mg by mouth at bedtime.   prednisoLONE acetate 1 % ophthalmic suspension Commonly known as: PRED FORTE Place 1 drop into the right eye daily.   QUEtiapine 25 MG tablet Commonly known as: SEROQUEL Take 12.5 mg by mouth 2 (two) times daily.   senna 8.6 MG tablet Commonly known as: SENOKOT Take 2 tablets by mouth at bedtime.   sertraline 50 MG tablet Commonly known as: ZOLOFT Take 50 mg by mouth daily.   Systane 0.4-0.3 % Soln Generic drug: Polyethyl Glycol-Propyl Glycol Place 1 drop into both eyes in the morning and at bedtime.   white petrolatum ointment Apply topically daily. Apply to nose and bilateral temples        Review of Systems  Unable to perform ROS: Dementia    Immunization History  Administered Date(s) Administered   Influenza, High Dose Seasonal PF 10/02/2013, 08/25/2015, 09/03/2016, 09/05/2017, 09/12/2018, 08/25/2019, 09/20/2022   Influenza-Unspecified 10/01/2014, 09/03/2020, 09/09/2021   Moderna SARS-COV2 Booster Vaccination 04/21/2021, 09/23/2022   Moderna Sars-Covid-2  Vaccination 11/24/2019, 12/22/2019, 09/30/2020, 04/21/2021, 04/09/2022   Pneumococcal Conjugate-13 09/30/2014, 10/21/2014   Pneumococcal Polysaccharide-23 01/23/2018   Td 04/18/2013   Unspecified SARS-COV-2 Vaccination 11/04/2021  Zoster, Live 09/04/2012   Pertinent  Health Maintenance Due  Topic Date Due   DEXA SCAN  12/03/2023 (Originally 07/26/1987)   INFLUENZA VACCINE  Completed      07/23/2020    8:00 AM 07/23/2020    7:30 PM 07/24/2020    7:25 AM 08/24/2022    2:35 PM 10/29/2022    2:43 PM  [place] in the past year?    0 0  Was there an injury with Fall?    0 0  Fall Risk Category Calculator    0 0  Fall Risk Category (Retired)    Low Low  (RETIRED) Patient Fall Risk Level High fall risk High fall risk High fall risk Low fall risk Low fall risk  Patient at Risk for Falls Due to    No Fall Risks No Fall Risks  Fall risk Follow up    Falls evaluation completed Falls evaluation completed   Functional Status Survey:    Vitals:   01/06/23 1158  BP: 132/70  Pulse: 72  Resp: 18  Temp: (!) 97.4 F (36.3 C)  SpO2: 96%  Weight: 178 lb 11.2 oz (81.1 kg)   Body mass index is 28.84 kg/m. Physical Exam Vitals and nursing note reviewed.  Constitutional:      Appearance: Normal appearance.  HENT:     Head: Normocephalic and atraumatic.  Eyes:     Extraocular Movements: Extraocular movements intact.     Conjunctiva/sclera: Conjunctivae normal.     Pupils: Pupils are equal, round, and reactive to light.  Cardiovascular:     Rate and Rhythm: Normal rate and regular rhythm.  Pulmonary:     Effort: Pulmonary effort is normal.     Breath sounds: No rales.  Abdominal:     General: Bowel sounds are normal.     Palpations: Abdomen is soft.     Tenderness: There is no abdominal tenderness.     Comments: Right inguinal hernia, reducible, a tennis ball sized from previous examination.   Musculoskeletal:     Cervical back: Normal range of motion and neck supple.     Right  lower leg: Edema present.     Left lower leg: Edema present.     Comments: Lower back pain is better. Trace edema BLE  Skin:    General: Skin is warm and dry.     Findings: Erythema and rash present.     Comments:  redness, warmth, swelling, scaly, sensitive to touch from the right clavicle to the left upper chest/lower neck  Neurological:     General: No focal deficit present.     Mental Status: She is alert. Mental status is at baseline.     Gait: Gait abnormal.     Comments: Oriented to person, place.   Psychiatric:        Mood and Affect: Mood normal.        Behavior: Behavior normal.     Comments: Pleasantly confused.      Labs reviewed: Recent Labs    02/12/22 0000 08/05/22 0700  NA 142 140  K 4.6 4.1  CL 105 106  CO2 23* 29*  BUN 17 16  CREATININE 1.1 1.1  CALCIUM 9.5 9.8   Recent Labs    02/12/22 0000 08/05/22 0700  AST 16 15  ALT 8 8  ALKPHOS 51 53  ALBUMIN 3.4* 3.5   Recent Labs    02/12/22 0000 08/05/22 0700  WBC 5.4 5.6  NEUTROABS  --  2,918.00  HGB 11.2* 11.1*  HCT 35* 33*  PLT 200 190   Lab Results  Component Value Date   TSH 4.10 08/05/2022   Lab Results  Component Value Date   HGBA1C 11.1 08/05/2022   Lab Results  Component Value Date   CHOL 136 02/12/2022   HDL 42 02/12/2022   LDLCALC 74 02/12/2022   TRIG 112 02/12/2022    Significant Diagnostic Results in last 30 days:  No results found.  Assessment/Plan: Cellulitis of skin Upper chest around lower neck from R clavicle to the L, red, hot, scaly, selling, will treat with Doxycycline 137m bid x 7 days.   Rash Red, scaly, warmth, swelling upper chest lower neck from then right clavicle to the left, will apply 0.5% Triamcinolone/Nystatin cream bid x 10 days, Prednisone 264mpo qd x 5 days, observe.   Senile dementia ([place] takes Memantine, SN[facility] for supportive care, TSH 4.1 08/05/22   PAF (paroxysmal atrial fibrillation) (HCC) Heart rate is in control, takes  Eliquis   Depression with anxiety  takes Sertraline, Quetiapine-GDR 01/04/23 to daily/bid,  off Buspar 09/26/20   Rheumatoid arthritis ([place] takes Methotrexate 153mkly, also takes Tylenol qid.     Family/ staff Communication: plan of care reviewed with the patient and charge nurse.   Labs/tests ordered:  none  Time spend 35 minutes.

## 2023-01-06 NOTE — Assessment & Plan Note (Signed)
Red, scaly, warmth, swelling upper chest lower neck from then right clavicle to the left, will apply 0.5% Triamcinolone/Nystatin cream bid x 10 days, Prednisone 82m po qd x 5 days, observe.

## 2023-01-06 NOTE — Assessment & Plan Note (Signed)
takes Memantine, SNF [facility] for supportive care, TSH 4.1 08/05/22

## 2023-01-06 NOTE — Assessment & Plan Note (Signed)
takes Methotrexate 17m wkly, also takes Tylenol qid.

## 2023-01-06 NOTE — Assessment & Plan Note (Signed)
Heart rate is in control, takes Eliquis

## 2023-01-06 NOTE — Assessment & Plan Note (Signed)
Upper chest around lower neck from R clavicle to the L, red, hot, scaly, selling, will treat with Doxycycline 138m bid x 7 days.

## 2023-01-21 ENCOUNTER — Non-Acute Institutional Stay (SKILLED_NURSING_FACILITY): Payer: Medicare Other | Admitting: Nurse Practitioner

## 2023-01-21 ENCOUNTER — Encounter: Payer: Self-pay | Admitting: Nurse Practitioner

## 2023-01-21 DIAGNOSIS — F0390 Unspecified dementia without behavioral disturbance: Secondary | ICD-10-CM

## 2023-01-21 DIAGNOSIS — D638 Anemia in other chronic diseases classified elsewhere: Secondary | ICD-10-CM

## 2023-01-21 DIAGNOSIS — K5901 Slow transit constipation: Secondary | ICD-10-CM

## 2023-01-21 DIAGNOSIS — E785 Hyperlipidemia, unspecified: Secondary | ICD-10-CM

## 2023-01-21 DIAGNOSIS — M810 Age-related osteoporosis without current pathological fracture: Secondary | ICD-10-CM | POA: Diagnosis not present

## 2023-01-21 DIAGNOSIS — M069 Rheumatoid arthritis, unspecified: Secondary | ICD-10-CM | POA: Diagnosis not present

## 2023-01-21 DIAGNOSIS — F418 Other specified anxiety disorders: Secondary | ICD-10-CM | POA: Diagnosis not present

## 2023-01-21 DIAGNOSIS — I480 Paroxysmal atrial fibrillation: Secondary | ICD-10-CM

## 2023-01-21 DIAGNOSIS — R269 Unspecified abnormalities of gait and mobility: Secondary | ICD-10-CM

## 2023-01-21 NOTE — Progress Notes (Unsigned)
Location:  [place] Room Number: NO/50/A Place of Service:  SNF (31) Provider:  Shanetta Nicolls X, NP   Patient Care Team: Virgie Dad, MD as PCP - General (Internal Medicine) Pichardo-Geisinger, Mila Palmer, MD as Referring Physician Nestor Ramp, Effie Shy, MD as Referring Physician (Ophthalmology)  Extended Emergency Contact Information Primary Emergency Contact: Key, Tim Address: [address]          [place], [place] [address] [name] of [place] Phone: [phone] Relation: Nephew Secondary Emergency Contact: Owens Shark Address: [address]          [place], [place] [address] [name] of [place] Phone: [phone] Mobile Phone: [phone] Relation: Other  Code Status:  DNR Goals of care: Advanced Directive information    01/21/2023    9:11 AM  Advanced Directives  Does Patient Have a Medical Advance Directive? Yes  Type of Advance Directive Living will;Out of facility DNR (pink MOST or yellow form)  Does patient want to make changes to medical advance directive? No - Patient declined  Pre-existing out of facility DNR order (yellow form or pink MOST form) Pink MOST form placed in chart (order not valid for inpatient use)     Chief Complaint  Patient presents with   Medical Management of Chronic Issues    Patient is here for a follow up for chronic conditions Patient is also due for AWV    HPI:  Pt is a 89 y.o. female seen today for managing chronic medical conditions.     Gait abnormality, uses w/c for mobility, risk of falling             L5 fx sustained from fall, 07/19/20, takes Tylenol, uses w/c for mobility.              Constipation, takes Senna, MiraLax               Dementia, takes Memantine, SNF [facility] for supportive care, TSH 4.1 08/05/22             Afib, takes Eliquis             Depression/anxiety, takes Sertraline, Quetiapine-GDR to qd 01/04/23,  off Buspar 09/26/20             RA, takes Methotrexate  '15mg'$  wkly, also takes Tylenol qid.              OP, takes Prolia, Ca, Vit D             Anemia, Fe 66, Vit B12 1010 [id number], takes Folic acid, Hgb [id number] 123456             Hyperlipidemia, takes Pravastatin, LDL 74 02/12/22                  Past Medical History:  Diagnosis Date   Acid reflux disease    Arthritis    rheumatoid   Atrial fibrillation (HCC)    Back pain    Bursitis of hip, right    Cancer (HCC)    skin   Chest pain    Confusion    Constipation    Fuchs' corneal dystrophy    GERD (gastroesophageal reflux disease)    Hard of hearing    Knee pain    Memory loss    Paranoia (HCC)    Shoulder pain    Venous insufficiency    Past Surgical History:  Procedure Laterality Date   CORNEAL TRANSPLANT  2012   Dr Maudie Mercury  RECONSTRUCTION OF EYELID     eyelid cancer 10 years ago Dr Victorino Dike    RECONSTRUCTION OF NOSE     nose cancer/ Dr Nyoka Cowden 10 years ago     Allergies  Allergen Reactions   Novocain [Procaine] Other (See Comments)    Unknown reaction   Sulfamethoxazole-Trimethoprim Other (See Comments)    Unknown reaction   Tape Other (See Comments)    Adhesive tape - unknown reaction   Codeine Other (See Comments)    Unknown reaction   Neosporin [Neomycin-Bacitracin Zn-Polymyx] Other (See Comments)    Unknown reaction    Outpatient Encounter Medications as of 01/21/2023  Medication Sig   acetaminophen (TYLENOL) 325 MG tablet Take 650 mg by mouth every 6 (six) hours as needed.   apixaban (ELIQUIS) 2.5 MG TABS tablet Take 2.5 mg by mouth 2 (two) times daily.   denosumab (PROLIA) 60 MG/ML SOSY injection Inject 60 mg into the skin every 6 (six) months. On the 22nd of reach 6th month   Emollient (AQUAPHOR ADV PROTECT HEALING) 41 % OINT Apply 1 application topically daily.   folic acid (FOLVITE) 1 MG tablet Take 1 mg by mouth daily.   memantine (NAMENDA) 10 MG tablet Take 10 mg by mouth 2 (two) times daily.    methotrexate (RHEUMATREX) 2.5 MG tablet Take 15 mg by mouth  every Friday. Caution:Chemotherapy. Protect from light.   Multiple Vitamin-Folic Acid TABS Take 1 tablet by mouth daily. + B12   Polyethyl Glycol-Propyl Glycol (SYSTANE) 0.4-0.3 % SOLN Place 1 drop into both eyes in the morning and at bedtime.    polyethylene glycol (MIRALAX / GLYCOLAX) 17 g packet Take 17 g by mouth every other day.   pravastatin (PRAVACHOL) 20 MG tablet Take 20 mg by mouth at bedtime.    prednisoLONE acetate (PRED FORTE) 1 % ophthalmic suspension Place 1 drop into the right eye daily.   QUEtiapine (SEROQUEL) 25 MG tablet Take 12.5 mg by mouth 2 (two) times daily.   senna (SENOKOT) 8.6 MG tablet Take 2 tablets by mouth at bedtime.   sertraline (ZOLOFT) 50 MG tablet Take 50 mg by mouth daily.    white petrolatum ointment Apply topically daily. Apply to nose and bilateral temples   No facility-administered encounter medications on file as of 01/21/2023.    Review of Systems  Unable to perform ROS: Dementia    Immunization History  Administered Date(s) Administered   Influenza, High Dose Seasonal PF 10/02/2013, 08/25/2015, 09/03/2016, 09/05/2017, 09/12/2018, 08/25/2019, 09/20/2022   Influenza-Unspecified 10/01/2014, 09/03/2020, 09/09/2021   Moderna SARS-COV2 Booster Vaccination 04/21/2021, 09/23/2022   Moderna Sars-Covid-2 Vaccination 11/24/2019, 12/22/2019, 09/30/2020, 04/21/2021, 04/09/2022   Pneumococcal Conjugate-13 09/30/2014, 10/21/2014   Pneumococcal Polysaccharide-23 01/23/2018   Td 04/18/2013   Unspecified SARS-COV-2 Vaccination 11/04/2021   Zoster, Live 09/04/2012   Pertinent  Health Maintenance Due  Topic Date Due   DEXA SCAN  12/03/2023 (Originally 07/26/1987)   INFLUENZA VACCINE  Completed      07/23/2020    7:30 PM 07/24/2020    7:25 AM 08/24/2022    2:35 PM 10/29/2022    2:43 PM 01/21/2023    9:12 AM  Fall Risk  Falls in the past year?   0 0 0  Was there an injury with Fall?   0 0 0  Fall Risk Category Calculator   0 0 0  Fall Risk Category (Retired)    Low Low   (RETIRED) Patient Fall Risk Level High fall risk High fall risk Low fall risk  Low fall risk   Patient at Risk for Falls Due to   No Fall Risks No Fall Risks No Fall Risks  Fall risk Follow up   Falls evaluation completed Falls evaluation completed Falls evaluation completed   Functional Status Survey:    Vitals:   01/21/23 0915  BP: 120/65  Pulse: 65  Resp: 18  Temp: 97.7 F (36.5 C)  SpO2: 96%  Weight: 178 lb 11.2 oz (81.1 kg)  Height: '5\' 6"'$  (1.676 m)   Body mass index is 28.84 kg/m. Physical Exam Vitals and nursing note reviewed.  Constitutional:      Appearance: Normal appearance.  HENT:     Head: Normocephalic and atraumatic.  Eyes:     Extraocular Movements: Extraocular movements intact.     Conjunctiva/sclera: Conjunctivae normal.     Pupils: Pupils are equal, round, and reactive to light.  Cardiovascular:     Rate and Rhythm: Normal rate and regular rhythm.  Pulmonary:     Effort: Pulmonary effort is normal.     Breath sounds: No rales.  Abdominal:     General: Bowel sounds are normal.     Palpations: Abdomen is soft.     Tenderness: There is no abdominal tenderness.     Comments: Right inguinal hernia, reducible, a tennis ball sized from previous examination.   Musculoskeletal:     Cervical back: Normal range of motion and neck supple.     Right lower leg: Edema present.     Left lower leg: Edema present.     Comments: Lower back pain is better. Trace edema BLE  Skin:    General: Skin is warm and dry.     Findings: No erythema or rash.     Comments: resolved redness, warmth, swelling, scaly, sensitive to touch from the right clavicle to the left upper chest/lower neck  Neurological:     General: No focal deficit present.     Mental Status: She is alert. Mental status is at baseline.     Gait: Gait abnormal.     Comments: Oriented to person, place.   Psychiatric:        Mood and Affect: Mood normal.        Behavior: Behavior normal.      Comments: Pleasantly confused.      Labs reviewed: Recent Labs    02/12/22 0000 08/05/22 0700  NA 142 140  K 4.6 4.1  CL 105 106  CO2 23* 29*  BUN 17 16  CREATININE 1.1 1.1  CALCIUM 9.5 9.8   Recent Labs    02/12/22 0000 08/05/22 0700  AST 16 15  ALT 8 8  ALKPHOS 51 53  ALBUMIN 3.4* 3.5   Recent Labs    02/12/22 0000 08/05/22 0700  WBC 5.4 5.6  NEUTROABS  --  2,918.00  HGB 11.2* 11.1*  HCT 35* 33*  PLT 200 190   Lab Results  Component Value Date   TSH 4.10 08/05/2022   Lab Results  Component Value Date   HGBA1C 11.1 08/05/2022   Lab Results  Component Value Date   CHOL 136 02/12/2022   HDL 42 02/12/2022   LDLCALC 74 02/12/2022   TRIG 112 02/12/2022    Significant Diagnostic Results in last 30 days:  No results found.  Assessment/Plan Depression with anxiety  Stable, takes Sertraline, GDR Quetiapine qd 01/04/23,  off Buspar 09/26/20   Rheumatoid arthritis ([place])  takes Methotrexate '15mg'$  wkly, also takes Tylenol qid.    Osteoporosis  takes Methotrexate '15mg'$  wkly, also takes Tylenol qid.    Anemia, chronic disease Fe 66, Vit B12 1010 [id number], takes Folic acid, Hgb [id number] 123456  Hyperlipidemia  takes Pravastatin, LDL 74 02/12/22                 PAF (paroxysmal atrial fibrillation) (HCC) Heart rate is in control, takes Eliquis   Senile dementia ([place])  takes Memantine, SNF [facility] for supportive care, TSH 4.1 08/05/22   Slow transit constipation takes Senna, MiraLax    Gait abnormality   Gait abnormality, uses w/c for mobility, risk of falling             L5 fx sustained from fall, 07/19/20, takes Tylenol, uses w/c for mobility.      Family/ staff Communication: plan of care reviewed with the patient and charge nurse.   Labs/tests ordered:  none  Time spend 35 minutes.

## 2023-01-24 ENCOUNTER — Encounter: Payer: Self-pay | Admitting: Nurse Practitioner

## 2023-01-24 NOTE — Assessment & Plan Note (Signed)
Fe 66, Vit B12 1010 [id number], takes Folic acid, Hgb [id number] [id number]

## 2023-01-24 NOTE — Assessment & Plan Note (Signed)
Heart rate is in control, takes Eliquis

## 2023-01-24 NOTE — Assessment & Plan Note (Signed)
takes Senna, MiraLax

## 2023-01-24 NOTE — Assessment & Plan Note (Signed)
takes Methotrexate '15mg'$  wkly, also takes Tylenol qid.

## 2023-01-24 NOTE — Assessment & Plan Note (Signed)
takes Pravastatin, LDL 74 02/12/22

## 2023-01-24 NOTE — Assessment & Plan Note (Signed)
Gait abnormality, uses w/c for mobility, risk of falling             L5 fx sustained from fall, 07/19/20, takes Tylenol, uses w/c for mobility.

## 2023-01-24 NOTE — Assessment & Plan Note (Signed)
Stable, takes Sertraline, GDR Quetiapine qd 01/04/23,  off Buspar 09/26/20

## 2023-01-24 NOTE — Assessment & Plan Note (Signed)
takes Memantine, SNF [facility] for supportive care, TSH 4.1 08/05/22

## 2023-02-02 ENCOUNTER — Non-Acute Institutional Stay (SKILLED_NURSING_FACILITY): Payer: Medicare Other | Admitting: Adult Health

## 2023-02-02 ENCOUNTER — Encounter: Payer: Self-pay | Admitting: Adult Health

## 2023-02-02 DIAGNOSIS — M810 Age-related osteoporosis without current pathological fracture: Secondary | ICD-10-CM

## 2023-02-02 DIAGNOSIS — F0390 Unspecified dementia without behavioral disturbance: Secondary | ICD-10-CM

## 2023-02-02 DIAGNOSIS — I480 Paroxysmal atrial fibrillation: Secondary | ICD-10-CM | POA: Diagnosis not present

## 2023-02-02 DIAGNOSIS — F339 Major depressive disorder, recurrent, unspecified: Secondary | ICD-10-CM | POA: Diagnosis not present

## 2023-02-02 DIAGNOSIS — E785 Hyperlipidemia, unspecified: Secondary | ICD-10-CM

## 2023-02-02 DIAGNOSIS — D489 Neoplasm of uncertain behavior, unspecified: Secondary | ICD-10-CM

## 2023-02-02 NOTE — Progress Notes (Unsigned)
Location:  [facility] Room Number: NO/50/A Place of Service:  SNF [phone]) Provider:  Durenda Age, NP   Patient Care Team: Virgie Dad, MD as PCP - General (Internal Medicine) Pichardo-Geisinger, Mila Palmer, MD as Referring Physician Nestor Ramp, Effie Shy, MD as Referring Physician (Ophthalmology)  Extended Emergency Contact Information Primary Emergency Contact: Key, Tim Address: [address]          [place], [place] [address] [name] of [place] Phone: [phone] Relation: Nephew Secondary Emergency Contact: Owens Shark Address: [address]          [place], [place] [address] [name] of [place] Phone: [phone] Mobile Phone: [phone] Relation: Other  Code Status:  DNR Goals of care: Advanced Directive information    02/02/2023   10:47 AM  Advanced Directives  Does Patient Have a Medical Advance Directive? Yes  Type of Advance Directive Living will;Out of facility DNR (pink MOST or yellow form)  Does patient want to make changes to medical advance directive? No - Patient declined  Pre-existing out of facility DNR order (yellow form or pink MOST form) Pink MOST form placed in chart (order not valid for inpatient use)     Chief Complaint  Patient presents with   Acute Visit    Refusing meds and not  eating    HPI:  Pt is a 89 y.o. female seen today for an acute visit for refusing meds   Past Medical History:  Diagnosis Date   Acid reflux disease    Arthritis    rheumatoid   Atrial fibrillation (HCC)    Back pain    Bursitis of hip, right    Cancer (HCC)    skin   Chest pain    Confusion    Constipation    Fuchs' corneal dystrophy    GERD (gastroesophageal reflux disease)    Hard of hearing    Knee pain    Memory loss    Paranoia ([place])    Shoulder pain    Venous insufficiency    Past Surgical History:  Procedure Laterality Date   CORNEAL TRANSPLANT  2012   Dr Maudie Mercury     RECONSTRUCTION OF EYELID     eyelid cancer 10 years ago Dr Victorino Dike    RECONSTRUCTION OF NOSE     nose cancer/ Dr Nyoka Cowden 10 years ago     Allergies  Allergen Reactions   Novocain [Procaine] Other (See Comments)    Unknown reaction   Sulfamethoxazole-Trimethoprim Other (See Comments)    Unknown reaction   Tape Other (See Comments)    Adhesive tape - unknown reaction   Codeine Other (See Comments)    Unknown reaction   Neosporin [Neomycin-Bacitracin Zn-Polymyx] Other (See Comments)    Unknown reaction    Outpatient Encounter Medications as of 02/02/2023  Medication Sig   acetaminophen (TYLENOL) 325 MG tablet Take 650 mg by mouth every 6 (six) hours as needed.   apixaban (ELIQUIS) 2.5 MG TABS tablet Take 2.5 mg by mouth 2 (two) times daily.   denosumab (PROLIA) 60 MG/ML SOSY injection Inject 60 mg into the skin every 6 (six) months. On the 22nd of reach 6th month   Emollient (AQUAPHOR ADV PROTECT HEALING) 41 % OINT Apply 1 application topically daily.   folic acid (FOLVITE) 1 MG tablet Take 1 mg by mouth daily.   lidocaine (THERACARE PAIN RELIEF) 4 % Place 1 patch onto the skin daily.   memantine (NAMENDA) 10 MG tablet Take  10 mg by mouth 2 (two) times daily.    methotrexate (RHEUMATREX) 2.5 MG tablet Take 15 mg by mouth every Friday. Caution:Chemotherapy. Protect from light.   Multiple Vitamin-Folic Acid TABS Take 1 tablet by mouth daily. + B12   ondansetron (ZOFRAN) 4 MG tablet Take 4 mg by mouth every 8 (eight) hours as needed for nausea or vomiting.   Polyethyl Glycol-Propyl Glycol (SYSTANE) 0.4-0.3 % SOLN Place 1 drop into both eyes in the morning and at bedtime.    polyethylene glycol (MIRALAX / GLYCOLAX) 17 g packet Take 17 g by mouth every other day.   pravastatin (PRAVACHOL) 20 MG tablet Take 20 mg by mouth at bedtime.    prednisoLONE acetate (PRED FORTE) 1 % ophthalmic suspension Place 1 drop into the right eye daily.   QUEtiapine (SEROQUEL) 25 MG tablet Take 12.5 mg by  mouth 2 (two) times daily.   senna (SENOKOT) 8.6 MG tablet Take 2 tablets by mouth at bedtime.   sertraline (ZOLOFT) 50 MG tablet Take 50 mg by mouth daily.    white petrolatum ointment Apply topically daily. Apply to nose and bilateral temples   No facility-administered encounter medications on file as of 02/02/2023.    Review of Systems  Immunization History  Administered Date(s) Administered   Influenza, High Dose Seasonal PF 10/02/2013, 08/25/2015, 09/03/2016, 09/05/2017, 09/12/2018, 08/25/2019, 09/20/2022   Influenza-Unspecified 10/01/2014, 09/03/2020, 09/09/2021   Moderna SARS-COV2 Booster Vaccination 04/21/2021, 09/23/2022   Moderna Sars-Covid-2 Vaccination 11/24/2019, 12/22/2019, 09/30/2020, 04/21/2021, 04/09/2022   Pneumococcal Conjugate-13 09/30/2014, 10/21/2014   Pneumococcal Polysaccharide-23 01/23/2018   Td 04/18/2013   Unspecified SARS-COV-2 Vaccination 11/04/2021   Zoster, Live 09/04/2012   Pertinent  Health Maintenance Due  Topic Date Due   DEXA SCAN  12/03/2023 (Originally 07/26/1987)   INFLUENZA VACCINE  Completed      07/24/2020    7:25 AM 08/24/2022    2:35 PM 10/29/2022    2:43 PM 01/21/2023    9:12 AM 02/02/2023   10:45 AM  [place] in the past year?  0 0 0 0  Was there an injury with Fall?  0 0 0 0  Fall Risk Category Calculator  0 0 0 0  Fall Risk Category (Retired)  Low Low    (RETIRED) Patient Fall Risk Level High fall risk Low fall risk Low fall risk    Patient at Risk for Falls Due to  No Fall Risks No Fall Risks No Fall Risks No Fall Risks  Fall risk Follow up  Falls evaluation completed Falls evaluation completed Falls evaluation completed Falls evaluation completed   Functional Status Survey:    Vitals:   02/02/23 1036  BP: 124/70  Pulse: 78  Resp: 18  Temp: 97.8 F (36.6 C)  SpO2: 96%  Weight: 174 lb 7 oz (79.1 kg)  Height: 5\' 6"  (1.676 m)   Body mass index is 28.15 kg/m. Physical Exam  Labs reviewed: Recent Labs     02/12/22 0000 08/05/22 0700  NA 142 140  K 4.6 4.1  CL 105 106  CO2 23* 29*  BUN 17 16  CREATININE 1.1 1.1  CALCIUM 9.5 9.8   Recent Labs    02/12/22 0000 08/05/22 0700  AST 16 15  ALT 8 8  ALKPHOS 51 53  ALBUMIN 3.4* 3.5   Recent Labs    02/12/22 0000 08/05/22 0700  WBC 5.4 5.6  NEUTROABS  --  2,918.00  HGB 11.2* 11.1*  HCT 35* 33*  PLT 200 190  Lab Results  Component Value Date   TSH 4.10 08/05/2022   Lab Results  Component Value Date   HGBA1C 11.1 08/05/2022   Lab Results  Component Value Date   CHOL 136 02/12/2022   HDL 42 02/12/2022   LDLCALC 74 02/12/2022   TRIG 112 02/12/2022    Significant Diagnostic Results in last 30 days:  No results found.  Assessment/Plan There are no diagnoses linked to this encounter.   Family/ staff Communication: ***  Labs/tests ordered:  ***

## 2023-02-03 ENCOUNTER — Other Ambulatory Visit: Payer: Self-pay | Admitting: Nurse Practitioner

## 2023-02-03 DIAGNOSIS — F0390 Unspecified dementia without behavioral disturbance: Secondary | ICD-10-CM

## 2023-02-08 ENCOUNTER — Non-Acute Institutional Stay (SKILLED_NURSING_FACILITY): Payer: Medicare Other | Admitting: Nurse Practitioner

## 2023-02-08 ENCOUNTER — Encounter: Payer: Self-pay | Admitting: Nurse Practitioner

## 2023-02-08 DIAGNOSIS — D638 Anemia in other chronic diseases classified elsewhere: Secondary | ICD-10-CM

## 2023-02-08 DIAGNOSIS — F418 Other specified anxiety disorders: Secondary | ICD-10-CM

## 2023-02-08 DIAGNOSIS — E785 Hyperlipidemia, unspecified: Secondary | ICD-10-CM

## 2023-02-08 DIAGNOSIS — M810 Age-related osteoporosis without current pathological fracture: Secondary | ICD-10-CM

## 2023-02-08 DIAGNOSIS — I480 Paroxysmal atrial fibrillation: Secondary | ICD-10-CM | POA: Diagnosis not present

## 2023-02-08 DIAGNOSIS — F0390 Unspecified dementia without behavioral disturbance: Secondary | ICD-10-CM | POA: Diagnosis not present

## 2023-02-08 DIAGNOSIS — M069 Rheumatoid arthritis, unspecified: Secondary | ICD-10-CM

## 2023-02-08 DIAGNOSIS — L0390 Cellulitis, unspecified: Secondary | ICD-10-CM

## 2023-02-08 NOTE — Progress Notes (Unsigned)
Location:  [facility] Room Number: [place] of Service:  SNF (31) Provider: Ginette Bradway X, NP    Patient Care Team: Virgie Dad, MD as PCP - General (Internal Medicine) Pichardo-Geisinger, Mila Palmer, MD as Referring Physician Nestor Ramp, Effie Shy, MD as Referring Physician (Ophthalmology)  Extended Emergency Contact Information Primary Emergency Contact: Key, Tim Address: [address]          [place], [place] [address] [name] of [place] Phone: [phone] Relation: Nephew Secondary Emergency Contact: Owens Shark Address: [address]          [place], [place] [address] [name] of [place] Phone: [phone] Mobile Phone: [phone] Relation: Other  Code Status: DNR Goals of care: Advanced Directive information    02/08/2023    2:32 PM  Advanced Directives  Does Patient Have a Medical Advance Directive? Yes  Type of Advance Directive Living will;Out of facility DNR (pink MOST or yellow form)  Does patient want to make changes to medical advance directive? No - Patient declined  Pre-existing out of facility DNR order (yellow form or pink MOST form) Pink MOST form placed in chart (order not valid for inpatient use)     Chief Complaint  Patient presents with   Acute Visit     Infected skin cancer right forearm.    HPI:  Pt is a 89 y.o. female seen today for an acute visit for a skin caner about a large marble sized ulcerated, peri wound erythema, swelling, and warmth, tenderness when touched. The patient opted out for tx.     Gait abnormality, uses w/c for mobility, risk of falling             L5 fx sustained from fall, 07/19/20, takes Tylenol, uses w/c for mobility.              Constipation, takes Senna              Dementia, on Memantine, SNF [facility] for supportive care, TSH 4.1 08/05/22             Afib, takes Eliquis             Depression/anxiety, takes Sertraline, Quetiapine-GDR to qd 01/04/23,  off Buspar  09/26/20             RA, takes Methotrexate 15mg  wkly, also takes Tylenol              OP, on Prolia, Ca, Vit D             Anemia, Fe 66, Vit B12 [id number] [id number], on  Folic acid, Hgb Q000111Q 123456             Hyperlipidemia, on Pravastatin, LDL 74 02/12/22                   Past Medical History:  Diagnosis Date   Acid reflux disease    Arthritis    rheumatoid   Atrial fibrillation (HCC)    Back pain    Bursitis of hip, right    Cancer (HCC)    skin   Chest pain    Confusion    Constipation    Fuchs' corneal dystrophy    GERD (gastroesophageal reflux disease)    Hard of hearing    Knee pain    Memory loss    Paranoia (HCC)    Shoulder pain    Venous insufficiency    Past Surgical History:  Procedure Laterality Date  CORNEAL TRANSPLANT  2012   Dr Maudie Mercury    RECONSTRUCTION OF EYELID     eyelid cancer 10 years ago Dr Victorino Dike    RECONSTRUCTION OF NOSE     nose cancer/ Dr Nyoka Cowden 10 years ago     Allergies  Allergen Reactions   Novocain [Procaine] Other (See Comments)    Unknown reaction   Sulfamethoxazole-Trimethoprim Other (See Comments)    Unknown reaction   Tape Other (See Comments)    Adhesive tape - unknown reaction   Codeine Other (See Comments)    Unknown reaction   Neosporin [Neomycin-Bacitracin Zn-Polymyx] Other (See Comments)    Unknown reaction    Outpatient Encounter Medications as of 02/08/2023  Medication Sig   acetaminophen (TYLENOL) 325 MG tablet Take 650 mg by mouth every 6 (six) hours as needed for moderate pain.   apixaban (ELIQUIS) 2.5 MG TABS tablet Take 2.5 mg by mouth 2 (two) times daily.   carboxymethylcellulose (REFRESH PLUS) 0.5 % SOLN Place 1 drop into both eyes 2 (two) times daily as needed.   denosumab (PROLIA) 60 MG/ML SOSY injection Inject 60 mg into the skin every 6 (six) months.   Emollient (AQUAPHOR ADV PROTECT HEALING) 41 % OINT Apply 1 application topically daily.   FOLIC ACID PO Take XX123456 mcg by mouth daily.   lidocaine (THERACARE  PAIN RELIEF) 4 % Place 1 patch onto the skin daily.   memantine (NAMENDA) 10 MG tablet Take 10 mg by mouth 2 (two) times daily.   methotrexate (RHEUMATREX) 2.5 MG tablet Take 15 mg by mouth every Friday. Caution:Chemotherapy. Protect from light.   mupirocin ointment (BACTROBAN) 2 % 1 Application 2 (two) times daily.   Polyethyl Glycol-Propyl Glycol (SYSTANE) 0.4-0.3 % SOLN Place 1 drop into both eyes in the morning and at bedtime.    polyethylene glycol (MIRALAX / GLYCOLAX) 17 g packet Take 17 g by mouth every other day.   pravastatin (PRAVACHOL) 20 MG tablet Take 20 mg by mouth daily.   prednisoLONE acetate (PRED FORTE) 1 % ophthalmic suspension Place 1 drop into the right eye daily.   QUEtiapine (SEROQUEL) 25 MG tablet Take 12.5 mg by mouth 2 (two) times daily.   senna (SENOKOT) 8.6 MG tablet Take 2 tablets by mouth at bedtime.   sertraline (ZOLOFT) 50 MG tablet Take 50 mg by mouth daily.    white petrolatum ointment Apply topically daily. Apply to nose and bilateral temples   No facility-administered encounter medications on file as of 02/08/2023.    Review of Systems  Unable to perform ROS: Dementia    Immunization History  Administered Date(s) Administered   Influenza, High Dose Seasonal PF 10/02/2013, 08/25/2015, 09/03/2016, 09/05/2017, 09/12/2018, 08/25/2019, 09/20/2022   Influenza-Unspecified 10/01/2014, 09/03/2020, 09/09/2021   Moderna SARS-COV2 Booster Vaccination 04/21/2021, 09/23/2022   Moderna Sars-Covid-2 Vaccination 11/24/2019, 12/22/2019, 09/30/2020, 04/21/2021, 04/09/2022   Pneumococcal Conjugate-13 09/30/2014, 10/21/2014   Pneumococcal Polysaccharide-23 01/23/2018   Td 04/18/2013   Unspecified SARS-COV-2 Vaccination 11/04/2021   Zoster, Live 09/04/2012   Pertinent  Health Maintenance Due  Topic Date Due   DEXA SCAN  12/03/2023 (Originally 07/26/1987)   INFLUENZA VACCINE  Completed      08/24/2022    2:35 PM 10/29/2022    2:43 PM 01/21/2023    9:12 AM 02/02/2023    10:45 AM 02/08/2023    2:32 PM  [place] in the past year? 0 0 0 0 0  Was there an injury with Fall? 0 0 0 0 0  Fall Risk Category Calculator 0 0 0 0 0  Fall Risk Category (Retired) Low Low     (RETIRED) Patient Fall Risk Level Low fall risk Low fall risk     Patient at Risk for Falls Due to No Fall Risks No Fall Risks No Fall Risks No Fall Risks No Fall Risks  Fall risk Follow up Falls evaluation completed Falls evaluation completed Falls evaluation completed Falls evaluation completed Falls evaluation completed   Functional Status Survey:    Vitals:   02/08/23 1338  BP: 132/69  Pulse: 71  Resp: 18  Temp: 97.8 F (36.6 C)  SpO2: 96%  Weight: 174 lb 7 oz (79.1 kg)  Height: 5\' 6"  (1.676 m)   Body mass index is 28.15 kg/m. Physical Exam Vitals and nursing note reviewed.  Constitutional:      Appearance: Normal appearance.  HENT:     Head: Normocephalic and atraumatic.  Eyes:     Extraocular Movements: Extraocular movements intact.     Conjunctiva/sclera: Conjunctivae normal.     Pupils: Pupils are equal, round, and reactive to light.  Cardiovascular:     Rate and Rhythm: Normal rate and regular rhythm.  Pulmonary:     Effort: Pulmonary effort is normal.     Breath sounds: No rales.  Abdominal:     General: Bowel sounds are normal.     Palpations: Abdomen is soft.     Tenderness: There is no abdominal tenderness.     Comments: Right inguinal hernia, reducible, a tennis ball sized from previous examination.   Musculoskeletal:     Cervical back: Normal range of motion and neck supple.     Right lower leg: Edema present.     Left lower leg: Edema present.     Comments: Lower back pain is better. Trace edema BLE  Skin:    General: Skin is warm and dry.     Findings: Erythema and lesion present.     Comments: a skin caner about a large marble sized ulcerated, peri wound erythema, swelling, and warmth, tenderness when touched. The patient opted out for tx.     Neurological:     General: No focal deficit present.     Mental Status: She is alert. Mental status is at baseline.     Gait: Gait abnormal.     Comments: Oriented to person, place.   Psychiatric:        Mood and Affect: Mood normal.        Behavior: Behavior normal.     Comments: Pleasantly confused.      Labs reviewed: Recent Labs    02/12/22 0000 08/05/22 0700  NA 142 140  K 4.6 4.1  CL 105 106  CO2 23* 29*  BUN 17 16  CREATININE 1.1 1.1  CALCIUM 9.5 9.8   Recent Labs    02/12/22 0000 08/05/22 0700  AST 16 15  ALT 8 8  ALKPHOS 51 53  ALBUMIN 3.4* 3.5   Recent Labs    02/12/22 0000 08/05/22 0700  WBC 5.4 5.6  NEUTROABS  --  2,918.00  HGB 11.2* 11.1*  HCT 35* 33*  PLT 200 190   Lab Results  Component Value Date   TSH 4.10 08/05/2022   Lab Results  Component Value Date   HGBA1C 11.1 08/05/2022   Lab Results  Component Value Date   CHOL 136 02/12/2022   HDL 42 02/12/2022   LDLCALC 74 02/12/2022   TRIG 112 02/12/2022    Significant  Diagnostic Results in last 30 days:  No results found.  Assessment/Plan Cellulitis of skin  a skin caner about a large marble sized ulcerated, peri wound erythema, swelling, and warmth, tenderness when touched. The patient opted out for tx.  Apply Bactroban daily x 2 weeks, protective dressing to cover ulcerated skin cancer site.     Senile dementia ([place]) On Memantine, SNF [facility] for supportive care, TSH 4.1 08/05/22   PAF (paroxysmal atrial fibrillation) (HCC) Heart rate is in control, takes Eliquis   Depression with anxiety Her mood is stable, takes Sertraline, Quetiapine-GDR to qd 01/04/23,  off Buspar 09/26/20   Hyperlipidemia on Pravastatin, LDL 74 02/12/22                 Anemia, chronic disease Fe 66, Vit B12 [id number], on  Folic acid, Hgb Q000111Q 123456  Osteoporosis on Prolia, Ca, Vit D  Rheumatoid arthritis ([place])  takes Methotrexate 15mg  wkly, also takes Tylenol      Family/ staff  Communication: plan of care reviewed with the patient and charge nurse.   Labs/tests ordered: none  Time spend 35 minutes.

## 2023-02-10 NOTE — Assessment & Plan Note (Signed)
on Pravastatin, LDL 74 02/12/22

## 2023-02-10 NOTE — Assessment & Plan Note (Signed)
on Prolia, Ca, Vit D

## 2023-02-10 NOTE — Assessment & Plan Note (Signed)
Fe 66, Vit B12 [id number], on  Folic acid, Hgb Q000111Q 123456

## 2023-02-10 NOTE — Assessment & Plan Note (Addendum)
On Memantine, SNF [facility] for supportive care, TSH 4.1 08/05/22

## 2023-02-10 NOTE — Assessment & Plan Note (Signed)
a skin caner about a large marble sized ulcerated, peri wound erythema, swelling, and warmth, tenderness when touched. The patient opted out for tx.  Apply Bactroban daily x 2 weeks, protective dressing to cover ulcerated skin cancer site.

## 2023-02-10 NOTE — Assessment & Plan Note (Signed)
takes Methotrexate 15mg  wkly, also takes Tylenol

## 2023-02-10 NOTE — Assessment & Plan Note (Signed)
Her mood is stable, takes Sertraline, Quetiapine-GDR to qd 01/04/23,  off Buspar 09/26/20

## 2023-02-10 NOTE — Assessment & Plan Note (Signed)
Heart rate is in control, takes Eliquis  

## 2023-02-16 ENCOUNTER — Non-Acute Institutional Stay (SKILLED_NURSING_FACILITY): Payer: Medicare Other | Admitting: Nurse Practitioner

## 2023-02-16 ENCOUNTER — Encounter: Payer: Self-pay | Admitting: Nurse Practitioner

## 2023-02-16 DIAGNOSIS — M069 Rheumatoid arthritis, unspecified: Secondary | ICD-10-CM | POA: Diagnosis not present

## 2023-02-16 DIAGNOSIS — F0390 Unspecified dementia without behavioral disturbance: Secondary | ICD-10-CM | POA: Diagnosis not present

## 2023-02-16 DIAGNOSIS — R627 Adult failure to thrive: Secondary | ICD-10-CM

## 2023-02-16 NOTE — Progress Notes (Signed)
Location:   SNF [place] Room Number: 76 Place of Service:  SNF (31) Provider: [facility] Khyran Riera NP  Virgie Dad, MD  Patient Care Team: Virgie Dad, MD as PCP - General (Internal Medicine) Pichardo-Geisinger, Mila Palmer, MD as Referring Physician Nestor Ramp, Effie Shy, MD as Referring Physician (Ophthalmology)  Extended Emergency Contact Information Primary Emergency Contact: Key, Tim Address: [address]          [place], [place] [address] [name] of [place] Phone: [phone] Relation: Nephew Secondary Emergency Contact: Owens Shark Address: [address]          [place], [place] [address] [name] of [place] Phone: [phone] Mobile Phone: [phone] Relation: Other  Code Status:  DNR Goals of care: Advanced Directive information    02/08/2023    2:32 PM  Advanced Directives  Does Patient Have a Medical Advance Directive? Yes  Type of Advance Directive Living will;Out of facility DNR (pink MOST or yellow form)  Does patient want to make changes to medical advance directive? No - Patient declined  Pre-existing out of facility DNR order (yellow form or pink MOST form) Pink MOST form placed in chart (order not valid for inpatient use)     Chief Complaint  Patient presents with   Medical Management of Chronic Issues    HPI:  Pt is a 89 y.o. female seen today for medical management of chronic diseases.      Adult failure to thrive, refuses meds, under Hospice service, plan of care is comfort measures.   Senile dementia, prn Lorazepam, supportive care.   RA, prn Morphine  Past Medical History:  Diagnosis Date   Acid reflux disease    Arthritis    rheumatoid   Atrial fibrillation (HCC)    Back pain    Bursitis of hip, right    Cancer (HCC)    skin   Chest pain    Confusion    Constipation    Fuchs' corneal dystrophy    GERD (gastroesophageal reflux disease)    Hard of hearing    Knee pain    Memory loss     Paranoia (HCC)    Shoulder pain    Venous insufficiency    Past Surgical History:  Procedure Laterality Date   CORNEAL TRANSPLANT  2012   Dr Maudie Mercury    RECONSTRUCTION OF EYELID     eyelid cancer 10 years ago Dr Victorino Dike    RECONSTRUCTION OF NOSE     nose cancer/ Dr Nyoka Cowden 10 years ago     Allergies  Allergen Reactions   Novocain [Procaine] Other (See Comments)    Unknown reaction   Sulfamethoxazole-Trimethoprim Other (See Comments)    Unknown reaction   Tape Other (See Comments)    Adhesive tape - unknown reaction   Codeine Other (See Comments)    Unknown reaction   Neosporin [Neomycin-Bacitracin Zn-Polymyx] Other (See Comments)    Unknown reaction    Allergies as of 02/16/2023       Reactions   Novocain [procaine] Other (See Comments)   Unknown reaction   Sulfamethoxazole-trimethoprim Other (See Comments)   Unknown reaction   Tape Other (See Comments)   Adhesive tape - unknown reaction   Codeine Other (See Comments)   Unknown reaction   Neosporin [neomycin-bacitracin Zn-polymyx] Other (See Comments)   Unknown reaction        Medication List        Accurate as of February 16, 2023 12:45 PM. If you  have any questions, ask your nurse or doctor.          acetaminophen 325 MG tablet Commonly known as: TYLENOL Take 650 mg by mouth every 6 (six) hours as needed for moderate pain.   apixaban 2.5 MG Tabs tablet Commonly known as: ELIQUIS Take 2.5 mg by mouth 2 (two) times daily.   Aquaphor Adv Protect Healing 41 % Oint Apply 1 application topically daily.   carboxymethylcellulose 0.5 % Soln Commonly known as: REFRESH PLUS Place 1 drop into both eyes 2 (two) times daily as needed.   denosumab 60 MG/ML Sosy injection Commonly known as: PROLIA Inject 60 mg into the skin every 6 (six) months.   FOLIC ACID PO Take XX123456 mcg by mouth daily.   memantine 10 MG tablet Commonly known as: NAMENDA Take 10 mg by mouth 2 (two) times daily.   methotrexate 2.5 MG  tablet Commonly known as: RHEUMATREX Take 15 mg by mouth every Friday. Caution:Chemotherapy. Protect from light.   mupirocin ointment 2 % Commonly known as: BACTROBAN 1 Application 2 (two) times daily.   polyethylene glycol 17 g packet Commonly known as: MIRALAX / GLYCOLAX Take 17 g by mouth every other day.   pravastatin 20 MG tablet Commonly known as: PRAVACHOL Take 20 mg by mouth daily.   prednisoLONE acetate 1 % ophthalmic suspension Commonly known as: PRED FORTE Place 1 drop into the right eye daily.   QUEtiapine 25 MG tablet Commonly known as: SEROQUEL Take 12.5 mg by mouth 2 (two) times daily.   senna 8.6 MG tablet Commonly known as: SENOKOT Take 2 tablets by mouth at bedtime.   sertraline 50 MG tablet Commonly known as: ZOLOFT Take 50 mg by mouth daily.   Systane 0.4-0.3 % Soln Generic drug: Polyethyl Glycol-Propyl Glycol Place 1 drop into both eyes in the morning and at bedtime.   TheraCare Pain Relief 4 % Generic drug: lidocaine Place 1 patch onto the skin daily.   white petrolatum ointment Apply topically daily. Apply to nose and bilateral temples        Review of Systems  Unable to perform ROS: Dementia    Immunization History  Administered Date(s) Administered   Influenza, High Dose Seasonal PF 10/02/2013, 08/25/2015, 09/03/2016, 09/05/2017, 09/12/2018, 08/25/2019, 09/20/2022   Influenza-Unspecified 10/01/2014, 09/03/2020, 09/09/2021   Moderna SARS-COV2 Booster Vaccination 04/21/2021, 09/23/2022   Moderna Sars-Covid-2 Vaccination 11/24/2019, 12/22/2019, 09/30/2020, 04/21/2021, 04/09/2022   Pneumococcal Conjugate-13 09/30/2014, 10/21/2014   Pneumococcal Polysaccharide-23 01/23/2018   Td 04/18/2013   Unspecified SARS-COV-2 Vaccination 11/04/2021   Zoster, Live 09/04/2012   Pertinent  Health Maintenance Due  Topic Date Due   DEXA SCAN  12/03/2023 (Originally 07/26/1987)   INFLUENZA VACCINE  Completed      08/24/2022    2:35 PM 10/29/2022     2:43 PM 01/21/2023    9:12 AM 02/02/2023   10:45 AM 02/08/2023    2:32 PM  [place] in the past year? 0 0 0 0 0  Was there an injury with Fall? 0 0 0 0 0  Fall Risk Category Calculator 0 0 0 0 0  Fall Risk Category (Retired) Low Low     (RETIRED) Patient Fall Risk Level Low fall risk Low fall risk     Patient at Risk for Falls Due to No Fall Risks No Fall Risks No Fall Risks No Fall Risks No Fall Risks  Fall risk Follow up Falls evaluation completed Falls evaluation completed Falls evaluation completed Falls evaluation completed Falls  evaluation completed   Functional Status Survey:    Vitals:   02/16/23 1233  BP: (!) 140/80  Pulse: 78  Resp: 18  Temp: 97.8 F (36.6 C)  SpO2: 96%  Weight: 174 lb 11.2 oz (79.2 kg)   Body mass index is 28.2 kg/m. Physical Exam Vitals and nursing note reviewed.  Constitutional:      Appearance: Normal appearance.  HENT:     Head: Normocephalic and atraumatic.  Eyes:     Extraocular Movements: Extraocular movements intact.     Conjunctiva/sclera: Conjunctivae normal.     Pupils: Pupils are equal, round, and reactive to light.  Cardiovascular:     Rate and Rhythm: Normal rate and regular rhythm.  Pulmonary:     Effort: Pulmonary effort is normal.     Breath sounds: No rales.  Abdominal:     General: Bowel sounds are normal.     Palpations: Abdomen is soft.     Tenderness: There is no abdominal tenderness.     Comments: Right inguinal hernia, reducible, a tennis ball sized from previous examination.   Musculoskeletal:     Cervical back: Normal range of motion and neck supple.     Right lower leg: Edema present.     Left lower leg: Edema present.     Comments: Lower back pain is better. Trace edema BLE  Skin:    General: Skin is warm and dry.     Findings: Erythema and lesion present.     Comments: a skin caner about a large marble sized ulcerated, peri wound erythema, swelling, and warmth, tenderness when touched. The patient  opted out for tx.    Neurological:     General: No focal deficit present.     Mental Status: She is alert. Mental status is at baseline.     Gait: Gait abnormal.     Comments: Oriented to person, place.   Psychiatric:     Comments: Sad facial looks, wants to be left alone.      Labs reviewed: Recent Labs    08/05/22 0700  NA 140  K 4.1  CL 106  CO2 29*  BUN 16  CREATININE 1.1  CALCIUM 9.8   Recent Labs    08/05/22 0700  AST 15  ALT 8  ALKPHOS 53  ALBUMIN 3.5   Recent Labs    08/05/22 0700  WBC 5.6  NEUTROABS 2,918.00  HGB 11.1*  HCT 33*  PLT 190   Lab Results  Component Value Date   TSH 4.10 08/05/2022   Lab Results  Component Value Date   HGBA1C 11.1 08/05/2022   Lab Results  Component Value Date   CHOL 136 02/12/2022   HDL 42 02/12/2022   LDLCALC 74 02/12/2022   TRIG 112 02/12/2022    Significant Diagnostic Results in last 30 days:  No results found.  Assessment/Plan  Adult failure to thrive  refuses meds, under Hospice service, plan of care is comfort measures.   Senile dementia (HCC) prn Lorazepam, supportive care.   Rheumatoid arthritis ([place]) Pain management:  prn Morphine   Family/ staff Communication: plan of care reviewed with the patient and charge nurse.   Labs/tests ordered:  none  Time spend 35 minutes.

## 2023-02-16 NOTE — Assessment & Plan Note (Signed)
Pain management:  prn Morphine

## 2023-02-16 NOTE — Assessment & Plan Note (Signed)
refuses meds, under Hospice service, plan of care is comfort measures.

## 2023-02-16 NOTE — Assessment & Plan Note (Signed)
prn Lorazepam, supportive care.

## 2023-03-23 DEATH — deceased
